# Patient Record
Sex: Female | Born: 1960
Health system: Southern US, Community
[De-identification: ages and names within clinical notes are randomized; demographics above are authoritative.]

## PROBLEM LIST (undated history)

## (undated) DIAGNOSIS — E041 Nontoxic single thyroid nodule: Secondary | ICD-10-CM

## (undated) DIAGNOSIS — M797 Fibromyalgia: Secondary | ICD-10-CM

## (undated) DIAGNOSIS — M3500 Sicca syndrome, unspecified: Secondary | ICD-10-CM

## (undated) DIAGNOSIS — G629 Polyneuropathy, unspecified: Secondary | ICD-10-CM

## (undated) DIAGNOSIS — R000 Tachycardia, unspecified: Secondary | ICD-10-CM

## (undated) HISTORY — PX: MANDIBLE SURGERY: SHX707

## (undated) HISTORY — DX: Tachycardia, unspecified: R00.0

## (undated) HISTORY — DX: Sjogren syndrome, unspecified: M35.00

## (undated) HISTORY — DX: Polyneuropathy, unspecified: G62.9

## (undated) HISTORY — DX: Fibromyalgia: M79.7

## (undated) HISTORY — PX: BACK SURGERY: SHX140

## (undated) HISTORY — DX: Nontoxic single thyroid nodule: E04.1

## (undated) HISTORY — PX: TUBAL LIGATION: SHX77

---

## 1998-09-14 ENCOUNTER — Other Ambulatory Visit: Admission: RE | Admit: 1998-09-14 | Discharge: 1998-09-14 | Payer: Self-pay | Admitting: Obstetrics and Gynecology

## 1999-01-08 ENCOUNTER — Other Ambulatory Visit: Admission: RE | Admit: 1999-01-08 | Discharge: 1999-01-08 | Payer: Self-pay | Admitting: Obstetrics and Gynecology

## 2000-05-15 ENCOUNTER — Other Ambulatory Visit: Admission: RE | Admit: 2000-05-15 | Discharge: 2000-05-15 | Payer: Self-pay | Admitting: Obstetrics and Gynecology

## 2000-08-08 ENCOUNTER — Encounter: Admission: RE | Admit: 2000-08-08 | Discharge: 2000-08-08 | Payer: Self-pay | Admitting: Oral Surgery

## 2000-08-08 ENCOUNTER — Encounter: Payer: Self-pay | Admitting: Oral Surgery

## 2000-08-09 ENCOUNTER — Ambulatory Visit (HOSPITAL_BASED_OUTPATIENT_CLINIC_OR_DEPARTMENT_OTHER): Admission: RE | Admit: 2000-08-09 | Discharge: 2000-08-09 | Payer: Self-pay | Admitting: Oral Surgery

## 2002-08-28 ENCOUNTER — Other Ambulatory Visit: Admission: RE | Admit: 2002-08-28 | Discharge: 2002-08-28 | Payer: Self-pay | Admitting: Obstetrics and Gynecology

## 2002-10-08 ENCOUNTER — Encounter: Payer: Self-pay | Admitting: Obstetrics and Gynecology

## 2002-10-08 ENCOUNTER — Ambulatory Visit (HOSPITAL_COMMUNITY): Admission: RE | Admit: 2002-10-08 | Discharge: 2002-10-08 | Payer: Self-pay | Admitting: Obstetrics and Gynecology

## 2003-10-22 ENCOUNTER — Other Ambulatory Visit: Admission: RE | Admit: 2003-10-22 | Discharge: 2003-10-22 | Payer: Self-pay | Admitting: Obstetrics and Gynecology

## 2004-08-20 ENCOUNTER — Ambulatory Visit: Payer: Self-pay | Admitting: Family Medicine

## 2004-08-25 ENCOUNTER — Ambulatory Visit: Payer: Self-pay | Admitting: Internal Medicine

## 2005-01-21 ENCOUNTER — Ambulatory Visit: Payer: Self-pay | Admitting: Internal Medicine

## 2005-01-25 ENCOUNTER — Ambulatory Visit: Payer: Self-pay | Admitting: Internal Medicine

## 2005-04-25 ENCOUNTER — Ambulatory Visit: Payer: Self-pay | Admitting: Internal Medicine

## 2005-10-04 ENCOUNTER — Other Ambulatory Visit: Admission: RE | Admit: 2005-10-04 | Discharge: 2005-10-04 | Payer: Self-pay | Admitting: Obstetrics and Gynecology

## 2005-10-11 ENCOUNTER — Ambulatory Visit (HOSPITAL_COMMUNITY): Admission: RE | Admit: 2005-10-11 | Discharge: 2005-10-11 | Payer: Self-pay | Admitting: Obstetrics and Gynecology

## 2005-11-02 ENCOUNTER — Ambulatory Visit: Payer: Self-pay | Admitting: Internal Medicine

## 2005-11-08 ENCOUNTER — Ambulatory Visit: Payer: Self-pay | Admitting: Cardiology

## 2006-05-02 ENCOUNTER — Encounter: Admission: RE | Admit: 2006-05-02 | Discharge: 2006-05-02 | Payer: Self-pay | Admitting: Orthopaedic Surgery

## 2008-01-23 ENCOUNTER — Encounter: Admission: RE | Admit: 2008-01-23 | Discharge: 2008-01-23 | Payer: Self-pay | Admitting: Orthopaedic Surgery

## 2008-09-16 ENCOUNTER — Ambulatory Visit: Payer: Self-pay | Admitting: Internal Medicine

## 2008-09-16 DIAGNOSIS — R5383 Other fatigue: Secondary | ICD-10-CM

## 2008-09-16 DIAGNOSIS — R5381 Other malaise: Secondary | ICD-10-CM

## 2008-09-16 DIAGNOSIS — R635 Abnormal weight gain: Secondary | ICD-10-CM | POA: Insufficient documentation

## 2008-09-16 DIAGNOSIS — G4720 Circadian rhythm sleep disorder, unspecified type: Secondary | ICD-10-CM

## 2008-09-16 DIAGNOSIS — R631 Polydipsia: Secondary | ICD-10-CM

## 2008-09-16 DIAGNOSIS — E049 Nontoxic goiter, unspecified: Secondary | ICD-10-CM | POA: Insufficient documentation

## 2008-09-16 DIAGNOSIS — R632 Polyphagia: Secondary | ICD-10-CM | POA: Insufficient documentation

## 2008-09-16 LAB — CONVERTED CEMR LAB
Eosinophils Relative: 1.1 % (ref 0.0–5.0)
Free T4: 0.6 ng/dL (ref 0.6–1.6)
HCT: 37.1 % (ref 36.0–46.0)
Hemoglobin: 13 g/dL (ref 12.0–15.0)
Monocytes Absolute: 0.4 10*3/uL (ref 0.1–1.0)
Monocytes Relative: 7 % (ref 3.0–12.0)
Platelets: 283 10*3/uL (ref 150–400)
WBC: 6.3 10*3/uL (ref 4.5–10.5)

## 2008-09-19 ENCOUNTER — Encounter (INDEPENDENT_AMBULATORY_CARE_PROVIDER_SITE_OTHER): Payer: Self-pay | Admitting: *Deleted

## 2008-09-19 ENCOUNTER — Telehealth (INDEPENDENT_AMBULATORY_CARE_PROVIDER_SITE_OTHER): Payer: Self-pay | Admitting: *Deleted

## 2008-11-18 ENCOUNTER — Ambulatory Visit: Payer: Self-pay | Admitting: Internal Medicine

## 2008-11-18 LAB — CONVERTED CEMR LAB
Free T4: 0.7 ng/dL (ref 0.6–1.6)
T3, Free: 2.9 pg/mL (ref 2.3–4.2)

## 2008-11-19 ENCOUNTER — Encounter (INDEPENDENT_AMBULATORY_CARE_PROVIDER_SITE_OTHER): Payer: Self-pay | Admitting: *Deleted

## 2009-01-22 ENCOUNTER — Ambulatory Visit (HOSPITAL_COMMUNITY): Admission: RE | Admit: 2009-01-22 | Discharge: 2009-01-22 | Payer: Self-pay | Admitting: Obstetrics and Gynecology

## 2009-03-03 ENCOUNTER — Ambulatory Visit: Payer: Self-pay | Admitting: Internal Medicine

## 2009-03-03 DIAGNOSIS — L659 Nonscarring hair loss, unspecified: Secondary | ICD-10-CM | POA: Insufficient documentation

## 2009-03-03 DIAGNOSIS — R002 Palpitations: Secondary | ICD-10-CM | POA: Insufficient documentation

## 2009-03-04 ENCOUNTER — Encounter (INDEPENDENT_AMBULATORY_CARE_PROVIDER_SITE_OTHER): Payer: Self-pay | Admitting: *Deleted

## 2009-03-06 ENCOUNTER — Encounter: Admission: RE | Admit: 2009-03-06 | Discharge: 2009-03-06 | Payer: Self-pay | Admitting: Internal Medicine

## 2009-03-06 LAB — CONVERTED CEMR LAB
Basophils Absolute: 0 10*3/uL (ref 0.0–0.1)
Free T4: 0.7 ng/dL (ref 0.6–1.6)
Hemoglobin: 12.4 g/dL (ref 12.0–15.0)
Lymphocytes Relative: 40.9 % (ref 12.0–46.0)
Monocytes Relative: 1.9 % — ABNORMAL LOW (ref 3.0–12.0)
Neutro Abs: 3.6 10*3/uL (ref 1.4–7.7)
Neutrophils Relative %: 55.3 % (ref 43.0–77.0)
Platelets: 313 10*3/uL (ref 150.0–400.0)
Potassium: 4.1 meq/L (ref 3.5–5.1)
RDW: 13 % (ref 11.5–14.6)
T3, Free: 2.4 pg/mL (ref 2.3–4.2)
TSH: 0.48 microintl units/mL (ref 0.35–5.50)

## 2009-03-10 ENCOUNTER — Ambulatory Visit: Payer: Self-pay | Admitting: Internal Medicine

## 2009-03-10 DIAGNOSIS — E041 Nontoxic single thyroid nodule: Secondary | ICD-10-CM | POA: Insufficient documentation

## 2009-03-12 ENCOUNTER — Ambulatory Visit: Payer: Self-pay | Admitting: Internal Medicine

## 2009-03-13 ENCOUNTER — Encounter (INDEPENDENT_AMBULATORY_CARE_PROVIDER_SITE_OTHER): Payer: Self-pay | Admitting: *Deleted

## 2009-03-13 ENCOUNTER — Telehealth (INDEPENDENT_AMBULATORY_CARE_PROVIDER_SITE_OTHER): Payer: Self-pay | Admitting: *Deleted

## 2009-03-17 ENCOUNTER — Encounter: Payer: Self-pay | Admitting: Internal Medicine

## 2009-03-23 ENCOUNTER — Encounter: Payer: Self-pay | Admitting: Internal Medicine

## 2009-04-02 ENCOUNTER — Encounter (HOSPITAL_COMMUNITY): Admission: RE | Admit: 2009-04-02 | Discharge: 2009-07-01 | Payer: Self-pay | Admitting: Endocrinology

## 2009-04-15 ENCOUNTER — Encounter: Payer: Self-pay | Admitting: Endocrinology

## 2009-04-15 ENCOUNTER — Ambulatory Visit (HOSPITAL_COMMUNITY): Admission: RE | Admit: 2009-04-15 | Discharge: 2009-04-15 | Payer: Self-pay | Admitting: Obstetrics and Gynecology

## 2009-04-15 ENCOUNTER — Encounter (INDEPENDENT_AMBULATORY_CARE_PROVIDER_SITE_OTHER): Payer: Self-pay | Admitting: Interventional Radiology

## 2009-04-28 ENCOUNTER — Encounter: Payer: Self-pay | Admitting: Internal Medicine

## 2009-05-14 ENCOUNTER — Ambulatory Visit: Payer: Self-pay | Admitting: Internal Medicine

## 2009-05-14 DIAGNOSIS — K5909 Other constipation: Secondary | ICD-10-CM | POA: Insufficient documentation

## 2009-05-15 ENCOUNTER — Encounter (INDEPENDENT_AMBULATORY_CARE_PROVIDER_SITE_OTHER): Payer: Self-pay | Admitting: *Deleted

## 2009-06-01 HISTORY — PX: THYROIDECTOMY: SHX17

## 2009-06-03 ENCOUNTER — Observation Stay (HOSPITAL_COMMUNITY): Admission: AD | Admit: 2009-06-03 | Discharge: 2009-06-05 | Payer: Self-pay | Admitting: General Surgery

## 2009-06-03 ENCOUNTER — Encounter (INDEPENDENT_AMBULATORY_CARE_PROVIDER_SITE_OTHER): Payer: Self-pay | Admitting: General Surgery

## 2009-06-17 ENCOUNTER — Encounter: Payer: Self-pay | Admitting: Internal Medicine

## 2009-07-22 ENCOUNTER — Encounter: Payer: Self-pay | Admitting: Internal Medicine

## 2009-08-01 HISTORY — PX: OTHER SURGICAL HISTORY: SHX169

## 2009-09-23 ENCOUNTER — Encounter: Payer: Self-pay | Admitting: Internal Medicine

## 2009-10-26 ENCOUNTER — Encounter (INDEPENDENT_AMBULATORY_CARE_PROVIDER_SITE_OTHER): Payer: Self-pay | Admitting: *Deleted

## 2009-10-26 ENCOUNTER — Ambulatory Visit: Payer: Self-pay | Admitting: Internal Medicine

## 2009-10-26 DIAGNOSIS — J1189 Influenza due to unidentified influenza virus with other manifestations: Secondary | ICD-10-CM

## 2009-10-26 LAB — CONVERTED CEMR LAB: Inflenza A Ag: POSITIVE

## 2009-11-02 ENCOUNTER — Encounter: Payer: Self-pay | Admitting: Internal Medicine

## 2010-05-21 ENCOUNTER — Ambulatory Visit: Payer: Self-pay | Admitting: Internal Medicine

## 2010-05-21 DIAGNOSIS — R519 Headache, unspecified: Secondary | ICD-10-CM | POA: Insufficient documentation

## 2010-05-21 DIAGNOSIS — R6889 Other general symptoms and signs: Secondary | ICD-10-CM

## 2010-05-21 DIAGNOSIS — R51 Headache: Secondary | ICD-10-CM | POA: Insufficient documentation

## 2010-05-24 LAB — CONVERTED CEMR LAB
FSH: 104.7 milliintl units/mL
TSH: 13.63 microintl units/mL — ABNORMAL HIGH (ref 0.35–5.50)

## 2010-06-15 ENCOUNTER — Encounter: Payer: Self-pay | Admitting: Internal Medicine

## 2010-06-28 ENCOUNTER — Telehealth (INDEPENDENT_AMBULATORY_CARE_PROVIDER_SITE_OTHER): Payer: Self-pay | Admitting: *Deleted

## 2010-08-23 ENCOUNTER — Encounter: Payer: Self-pay | Admitting: Endocrinology

## 2010-08-31 NOTE — Assessment & Plan Note (Signed)
Summary: fever,chills, cough, body aches//lch   Vital Signs:  Patient profile:   50 year old female Weight:      206.4 pounds Temp:     103.0 degrees F oral Pulse rate:   94 / minute Resp:     17 per minute BP sitting:   114 / 72  (left arm) Cuff size:   large  Vitals Entered By: Georgette Dover (October 26, 2009 12:12 PM) CC: Fever,chills,body aches, & cough (slightly productive) since Saturday night Comments REVIEWED MED LIST, PATIENT AGREED DOSE AND INSTRUCTION CORRECT    CC:  Fever, chills, body aches, and & cough (slightly productive) since Saturday night.  History of Present Illness: Onset 10/24/2009 as NP cough with temp to 101 as of 03/27. She had Flu shot in 06/2009.Rx: NSAIDS  Allergies (verified): No Known Drug Allergies  Review of Systems General:  Complains of chills; denies sweats. ENT:  Complains of sinus pressure; denies sore throat; Frontal headache; minimal clear  discharge. Minor earache.. Resp:  Denies chest pain with inspiration, coughing up blood, shortness of breath, and wheezing; Scant sputum . MS:  Complains of muscle aches and thoracic pain.  Physical Exam  General:  well-nourished,in no acute distress; alert,appropriate and cooperative throughout examination, non toxic Eyes:  No corneal or conjunctival inflammation noted. EOMI. Perrla.  Vision grossly normal. Ears:  External ear exam shows no significant lesions or deformities.  Otoscopic examination reveals clear canals, tympanic membranes are intact bilaterally without bulging, retraction, inflammation or discharge. Hearing is grossly normal bilaterally. Nose:  External nasal examination shows no deformity or inflammation. Nasal mucosa are pink and moist without lesions or exudates. Mouth:  Oral mucosa and oropharynx without lesions or exudates.  Teeth in good repair. no pharyngeal erythema.   Lungs:  Normal respiratory effort, chest expands symmetrically. Lungs are clear to auscultation, no crackles or  wheezes. dry cough. O2 sats 98% Heart:  regular rhythm, no murmur, and tachycardia(P 104).  S4 Skin:  Intact without suspicious lesions or rashesWarm & dry Cervical Nodes:  No lymphadenopathy noted; no tenderness Axillary Nodes:  No palpable lymphadenopathy   Impression & Recommendations:  Problem # 1:  INFLUENZA (ICD-487.8)  Complete Medication List: 1)  Topamax 100 Mg Tabs (Topiramate) .... 3 once daily 2)  Omeprazole 20 Mg Cpdr (Omeprazole) .Marland Kitchen.. 1 pre b'fast & eve meal 3)  Levothyroxine Sodium 150 Mcg Tabs (Levothyroxine sodium) .Marland Kitchen.. 1 by mouth once daily 4)  Lyrica 75 Mg Caps (Pregabalin) .Marland Kitchen.. 1 by mouth two times a day 5)  Meloxicam 15 Mg Tabs (Meloxicam) .Marland Kitchen.. 1 by mouth once daily 6)  Tamiflu 75 Mg Caps (Oseltamivir phosphate) .Marland Kitchen.. 1 two times a day  Other Orders: Flu A+B [id number])  Patient Instructions: 1)  Recommended remaining out of work for  03/28- 11/02/2009 due to Influenza A infection. 2)  Drink as much fluid as you can tolerate for the next few days. 3)  Take 650-1000mg  of Tylenol every 4-6 hours as needed for relief of pain or comfort of fever AVOID taking more than 4000mg   in a 24 hour period (can cause liver damage in higher doses) OR take 400-600mg  of Ibuprofen (Advil, Motrin) with food every 4-6 hours as needed for relief of pain or comfort of fever. Report Warning Signs as as discussed. Prescriptions: TOPAMAX 100 MG TABS (TOPIRAMATE) 3 once daily  #270 x 1   Entered and Authorized by:   Unice Cobble MD   Signed by:   Unice Cobble MD on 10/26/2009  Method used:   [facility]ed [facility] ...       [phone][facility]       [place], [place]  [address]       Ph: [phone] or [phone]       Fax: [phone]   RxID:   [id number] TAMIFLU 75 MG CAPS (OSELTAMIVIR PHOSPHATE) 1 two times a day  #10 x 0   Entered and Authorized by:   Unice Cobble MD   Signed by:   Unice Cobble MD on 10/26/2009   Method used:   Faxed to ...        [facility] [phone][facility] (retail)       [address] [place], [place]  [address]       Ph: [phone] or [phone]       Fax: [phone]   RxID:   [id number]   Laboratory Results    Other Tests  Influenza A: positive Influenza B: negative

## 2010-08-31 NOTE — Letter (Signed)
Summary: [facility]   Imported By: Edmonia James 11/09/2009 13:43:18  _____________________________________________________________________  External Attachment:    Type:   Image     Comment:   External Document

## 2010-08-31 NOTE — Letter (Signed)
Summary: [facility]   Imported By: Edmonia James 08/24/2009 [id number]  _____________________________________________________________________  External Attachment:    Type:   Image     Comment:   External Document

## 2010-08-31 NOTE — Letter (Signed)
Summary: [facility]  [facility]   Imported By: Edmonia James 10/01/2009 08:39:04  _____________________________________________________________________  External Attachment:    Type:   Image     Comment:   External Document

## 2010-08-31 NOTE — Assessment & Plan Note (Signed)
Summary: FOR HEADACHE//PH   Vital Signs:  Patient profile:   50 year old female Height:      63.75 inches Weight:      183.6 pounds BMI:     31.88 Temp:     98.3 degrees F oral Pulse rate:   60 / minute Resp:     15 per minute BP sitting:   114 / 70  (left arm) Cuff size:   large  Vitals Entered By: Georgette Dover CMA (May 21, 2010 9:27 AM) CC: HA's, Headaches   CC:  HA's and Headaches.  History of Present Illness:      Onset of severe headache since 03/20/2010 suboptimally responsive to Zomig, Oxycodone , massage , injections & another med from [facility] . She has been taking NSAIDS 600 mg two times a day on her own since Oct 1st.  The patient reports nausea, vomiting, sweats, tearing of eyes, sinus pressure, and photophobia, but denies phonophobia.  The headache is described as constant  diffusely and intermittent around OD.   She has chronic  neck pain/stiffness in context of cervical disc disease.  The patient denies the following high-risk features: fever, vision loss or change, focal weakness, and rash.  Caffeine intake : 1 cup coffee/ day.  Current Medications (verified): 1)  Topamax 100 Mg Tabs (Topiramate) .... 3 Once Daily 2)  Omeprazole 20 Mg Cpdr (Omeprazole) .Marland Kitchen.. 1 Pre B'fast & Eve Meal 3)  Levothyroxine Sodium 150 Mcg Tabs (Levothyroxine Sodium) .Marland Kitchen.. 1 By Mouth Once Daily  Allergies (verified): No Known Drug Allergies  Past History:  Past Medical History: Migraines Thyroid nodule  Past Surgical History: Jaw surgery; G2 P2; Tubal ligation; TVT , Dr Raphael Gibney, [place] 12/2008; surgical repair 04/2009 for poor healing; Thyroidectomy 06/2009. Intrauterine ablation 2011, Dr Burke Keels, [place]  Review of Systems General:  Complains of chills; denies fatigue and weight loss. Eyes:  Denies blurring, double vision, and vision loss-both eyes. ENT:  Denies decreased hearing and ringing in ears. GI:  Denies constipation and diarrhea. Neuro:  Denies brief paralysis,  numbness, and tingling. Endo:  Cold > heat intolerance X 8 months. TSH checked by Dr Chalmers Cater 10/2009. S/P intrauterine ablation recently.  Physical Exam  General:  well-nourished,in no acute distress; alert,appropriate and cooperative throughout examination Eyes:  No corneal or conjunctival inflammation noted. EOMI. Perrla. Field of  Vision grossly normal. Ears:  External ear exam shows no significant lesions or deformities.  Otoscopic examination reveals clear canals, tympanic membranes are intact bilaterally without bulging, retraction, inflammation or discharge. Hearing is grossly normal bilaterally. Some wax on L Nose:  External nasal examination shows no deformity or inflammation. Nasal mucosa are pink and moist without lesions or exudates. Mouth:  Oral mucosa and oropharynx without lesions or exudates.  Teeth in good repair. Neck:  No deformities, masses, or tenderness noted. Physiologic thyroid  area post op changes Heart:  Normal rate and regular rhythm. S1 and S2 normal without gallop, murmur, click, rub or other extra sounds. Pulses:  R and L carotid pulses are full and equal bilaterally w/o bruits Extremities:  No clubbing, cyanosis, edema. Neurologic:  alert & oriented X3, cranial nerves II-XII intact, strength normal in all extremities, sensation intact to light touch, gait normal, DTRs symmetrical and normal, finger-to-nose normal, heel-to-shin normal, and Romberg negative.   Skin:  Intact without suspicious lesions or rashes Cervical Nodes:  No lymphadenopathy noted Axillary Nodes:  No palpable lymphadenopathy Psych:  memory intact for recent and remote, normally interactive, good eye  contact, and not anxious appearing.     Impression & Recommendations:  Problem # 1:  HEADACHE (ICD-784.0)  Probable conversion of migraine into Chronic Daily Headache due to NSAIDS The following medications were removed from the medication list:    Meloxicam 15 Mg Tabs (Meloxicam) .Marland Kitchen... 1 by  mouth once daily  Orders: Headache Clinic Referral (Headache)  Problem # 2:  OTHER GENERAL SYMPTOMS (ICD-780.99)  Cold > heat intolerance  Orders: Venipuncture [id number]) TLB-TSH (Thyroid Stimulating Hormone) (99991111) TLB-FSH (Follicle Stimulating Hormone) (83001-FSH)  Complete Medication List: 1)  Topamax 100 Mg Tabs (Topiramate) .... 3 once daily 2)  Omeprazole 20 Mg Cpdr (Omeprazole) .Marland Kitchen.. 1 pre b'fast & eve meal 3)  Levothyroxine Sodium 150 Mcg Tabs (Levothyroxine sodium) .Marland Kitchen.. 1 by mouth once daily  Patient Instructions: 1)  Wean NSAIDS as discussed( 600 mg two times a day to 200mg  2 two times a day X 5 days then 1 two times a day X 5 days). Use Zomig as needed    Orders Added: 1)  Est. Patient Level IV [id number] 2)  Headache Clinic Referral [Headache] 3)  Venipuncture [id number] 4)  TLB-TSH (Thyroid Stimulating Hormone) [84443-TSH] 5)  TLB-FSH (Follicle Stimulating Hormone) [83001-FSH]  Appended Document: FOR HEADACHE//PH

## 2010-08-31 NOTE — Letter (Signed)
Summary: Out of Work  [facility]   [place], [place] [address]   Phone: [phone]  Fax: [phone]    October 26, 2009   Employee:  Terri Roberts    To Whom It May Concern:   For Medical reasons, please excuse the above named employee from work for the following dates:  Start:   October 26, 2009  End:   October 30, 2009 (Return on Friday)  If you need additional information, please feel free to contact our office.         Sincerely,    Chrae Emmaline Kluver

## 2010-08-31 NOTE — Progress Notes (Signed)
Summary: Refill Request  Phone Note Refill Request Call back at [phone] Message from:  Pharmacy on June 28, 2010 8:33 AM  Refills Requested: Medication #1:  TOPAMAX 100 MG TABS 3 once daily   Dosage confirmed as above?Dosage Confirmed   Brand Name Necessary? No   Supply Requested: 3 months   [facility] [address] [place]  Next Appointment Scheduled: none Initial call taken by: Elna Breslow,  June 28, 2010 8:34 AM    Prescriptions: TOPAMAX 100 MG TABS (TOPIRAMATE) 3 once daily  #270 x 1   Entered by:   [place] by:   Unice Cobble MD   Signed by:   Georgette Dover CMA on 06/28/2010   Method used:   Electronically to        [facility] [phone]* (retail)       [place], [place]  [address]       Ph: [phone] or [phone]       Fax: [phone]   RxID:   [id number]

## 2010-09-02 NOTE — Letter (Signed)
Summary: [facility]   Imported By: Edmonia James 07/12/2010 10:33:17  _____________________________________________________________________  External Attachment:    Type:   Image     Comment:   External Document

## 2010-09-13 ENCOUNTER — Telehealth: Payer: Self-pay | Admitting: Internal Medicine

## 2010-09-22 NOTE — Progress Notes (Signed)
Summary: Call A Nurse Report/recommendations  Phone Note Call from Patient Call back at Home Phone [phone]   Details for Reason: Call-A-Nurse Triage Call Report Triage Record Num: [id number] Operator: [place] Patient Name: Terri Roberts Call Date & Time: 09/11/2010 12:53:42PM Patient Phone: [phone] PCP: Unice Cobble Patient Gender: Female PCP Fax : [phone] Patient DOB: 11-Sep-1960 Practice Name: [facility] Reason for Call: Caller states that she developed nausea and vomiting last night about midnight, last vomited about 5am, now has watery diarrhea q 15 mins, states that her dad is an assisted living facility and has the same virus (states there is now a quarantine there because of the virus). Not sure if she has a fever, states she is "hot and cold", she is taking sips of water, Called in Phenergan 25mg  supp, use one per rectum q 4 hrs prn, #6, no refill, Dr. Ernestine Conrad on call, to [place], [place], [phone]. Gave care advice per protocol and callback parameters discussed. Protocol(s) Used: Nausea or Vomiting Recommended Outcome per Protocol: Provide Home/Self Care Reason for Outcome: New onset of 3-4 episodes vomiting or diarrhea following mild abdominal cramping Care Advice: Antidiarrheal medications are usually unnecessary. Use only after consulting your provider. Application of A&D ointment or witch hazel medicated pads may help relieve anal irritation.  ~  ~ Call provider if symptoms do not improve after 24 hours of home care. Call provider immediately if develop severe pain, black, tarry stools, bloody stools, blood-streaked or coffee ground-looking vomitus, or abdomen swollen.  ~  ~ SYMPTOM / CONDITION MANAGEMENT  ~ CAUTIONS Go to the ED if you have developed signs and symptoms of dehydration such as very dry mouth and tongue; increased pulse rate at rest; no urine output for 8 hours or more; increasing weakness or  drowsiness, or lightheadedness when trying to sit upright or standing.  ~ Vomiting and Diarrhea Care: - Do not eat solid foods until vomiting subsides. - Begin taking fluids by sucking on ice chip Details of Complaint: or popsicles or taking sips of cool, clear fluids (soda, fruit juices that are low acid, sports drinks or nonprescription oral rehydration solution). - Gradually drink larger amounts of these fluids so that you are drinking six to eight 8 oz. (1.2 to 1.6 liters) of fluids a day. - Keep activity to a minimum. - Once vomiting and diarrhea subside, eat smaller, more frequent meals of easily digested foods such as crackers, toast, bananas, rice, cooked cereal, applesauce, broth, baked or mashed potatoes, chicken or [place] without skin. Eat slowly. - Take fluids 30 minutes before or 60 minutes after meals. - Avoid high fat, highly seasoned, high fiber or high sugar content foods. - Avoid extremely hot or cold foods. - Do not take pain medication (such as aspirin, NSAIDs) while nauseated or vomiting. - Do not drink caffeinated or alcoholic beverages. Summary of Call: I called to check on the patient and she notes that she ended up going to the ER and was dehydrated and also had low Potassium. She notes that she has lost 9 pounds witht his virus. She states that she has been able keep a little bit down, she has nausea and her diarrhea was every 15 minutes, but has now went to every hour. Any recommendations? Initial call taken by: Ernestene Mention CMA,  September 13, 2010 11:43 AM  Follow-up for Phone Call        Clear liquids; Lomotil 2.5 mg  (#100 as needed for watery diarrhea  if not Rxed by ER. OV if no better. Follow-up by: Unice Cobble MD,  September 13, 2010 1:11 PM  Additional Follow-up for Phone Call Additional follow up Details #1::        Left message on voicemail to call back to office. Ernestene Mention CMA  September 13, 2010 2:21 PM   Patient notified. Ernestene Mention CMA   September 13, 2010 2:43 PM     New/Updated Medications: LOMOTIL 2.5-0.025 MG TABS (DIPHENOXYLATE-ATROPINE) Take as directed for watery stools Prescriptions: LOMOTIL 2.5-0.025 MG TABS (DIPHENOXYLATE-ATROPINE) Take as directed for watery stools  #100 x 0   Entered by:   Ernestene Mention CMA   Authorized by:   Unice Cobble MD   Signed by:   Ernestene Mention CMA on 09/13/2010   Method used:   Printed then faxed [facility] ...       [facility] [phone][facility]ail[facility]       [place], [place]  [address]       Ph: [phone] or [phone]       Fax: [phone]   RxID:   [id number]

## 2010-10-08 ENCOUNTER — Telehealth (INDEPENDENT_AMBULATORY_CARE_PROVIDER_SITE_OTHER): Payer: Self-pay | Admitting: *Deleted

## 2010-10-12 NOTE — Progress Notes (Signed)
Summary: refill  Phone Note Refill Request Message from:  Fax from Pharmacy on October 08, 2010 3:53 PM  Refills Requested: Medication #1:  TOPIRAMATE 200 MG TAB TAKE 2 TABLETS EVERY DAY [facility] [phone]  Initial call taken by: Arbie [place],  October 08, 2010 3:55 PM    Prescriptions: TOPAMAX 100 MG TABS (TOPIRAMATE) 3 once daily  #270 x 1   Entered by:   [place] by:   Unice Cobble MD   Signed by:   Georgette Dover CMA on 10/08/2010   Method used:   Electronically to        [facility] [phone]* (retail)       [place], [place]  [address]       Ph: [phone] or [phone]       Fax: [phone]   RxID:   [id number]

## 2010-10-14 ENCOUNTER — Other Ambulatory Visit (HOSPITAL_COMMUNITY): Payer: Self-pay | Admitting: Family Medicine

## 2010-10-14 DIAGNOSIS — Z1231 Encounter for screening mammogram for malignant neoplasm of breast: Secondary | ICD-10-CM

## 2010-10-21 ENCOUNTER — Ambulatory Visit (HOSPITAL_COMMUNITY)
Admission: RE | Admit: 2010-10-21 | Discharge: 2010-10-21 | Disposition: A | Discharge status: Home / Self Care | Payer: BC Managed Care – PPO | Source: Ambulatory Visit | Attending: Family Medicine | Admitting: Family Medicine

## 2010-10-21 DIAGNOSIS — Z1231 Encounter for screening mammogram for malignant neoplasm of breast: Secondary | ICD-10-CM | POA: Insufficient documentation

## 2010-11-03 LAB — COMPREHENSIVE METABOLIC PANEL
ALT: 13 U/L (ref 0–35)
Albumin: 3.9 g/dL (ref 3.5–5.2)
Alkaline Phosphatase: 49 U/L (ref 39–117)
BUN: 14 mg/dL (ref 6–23)
Chloride: 113 mEq/L — ABNORMAL HIGH (ref 96–112)
Potassium: 3.6 mEq/L (ref 3.5–5.1)
Sodium: 139 mEq/L (ref 135–145)
Total Bilirubin: 0.5 mg/dL (ref 0.3–1.2)

## 2010-11-03 LAB — CALCIUM: Calcium: 7.9 mg/dL — ABNORMAL LOW (ref 8.4–10.5)

## 2010-11-05 LAB — BASIC METABOLIC PANEL
CO2: 23 mEq/L (ref 19–32)
Chloride: 109 mEq/L (ref 96–112)
GFR calc non Af Amer: >60 mL/min (ref >60)
Glucose, Bld: 101 mg/dL — ABNORMAL HIGH (ref 70–99)
Potassium: 3.5 mEq/L (ref 3.5–5.1)
Sodium: 137 mEq/L (ref 135–145)

## 2010-11-05 LAB — URINALYSIS, ROUTINE W REFLEX MICROSCOPIC
Bilirubin Urine: NEGATIVE
Specific Gravity, Urine: 1.01 (ref 1.005–1.030)
Urobilinogen, UA: 0.2 mg/dL (ref 0.0–1.0)

## 2010-11-05 LAB — CBC
HCT: 39 % (ref 36.0–46.0)
Hemoglobin: 13.3 g/dL (ref 12.0–15.0)
RDW: 12.6 % (ref 11.5–15.5)

## 2010-11-05 LAB — URINE MICROSCOPIC-ADD ON

## 2010-11-08 LAB — URINALYSIS, ROUTINE W REFLEX MICROSCOPIC
Bilirubin Urine: NEGATIVE
Nitrite: NEGATIVE
Specific Gravity, Urine: 1.01 (ref 1.005–1.030)
pH: 6 (ref 5.0–8.0)

## 2010-11-08 LAB — CBC
HCT: 36.7 % (ref 36.0–46.0)
Hemoglobin: 12.9 g/dL (ref 12.0–15.0)
RBC: 3.84 MIL/uL — ABNORMAL LOW (ref 3.87–5.11)
WBC: 7.2 10*3/uL (ref 4.0–10.5)

## 2010-11-08 LAB — BASIC METABOLIC PANEL
GFR calc Af Amer: >60 mL/min (ref >60)
GFR calc non Af Amer: >60 mL/min (ref >60)
Potassium: 3.7 mEq/L (ref 3.5–5.1)
Sodium: 139 mEq/L (ref 135–145)

## 2010-12-14 NOTE — Op Note (Signed)
NAME:  Terri Roberts, BAUMGARDNER NO.:  [id number]   MEDICAL RECORD NO.:  [id number]          PATIENT TYPE:  AMB   LOCATION:  [place]                           FACILITY:  [facility]   PHYSICIAN:  Eli Hose, M.D.DATE OF BIRTH:  Dec 25, 1960   DATE OF PROCEDURE:  01/22/2009  DATE OF DISCHARGE:  01/22/2009                               OPERATIVE REPORT   PREOPERATIVE DIAGNOSES:  1. Stress urinary incontinence.  2. 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   POSTOPERATIVE DIAGNOSES:  1. Stress urinary incontinence.  2. 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   PROCEDURE:  1. Tension-free vaginal tape procedure using the tension-free vaginal      tape secur Hammoch approach.  2. Cystoscopy.   SURGEON:  Eli Hose, MD   FIRST ASSISTANT:  None.   ANESTHETIC:  General.   DISPOSITION:  Ms. Mastin is a 50 year old female, para 1-0-3-1, who  presents with the above-mentioned diagnosis.  She understands the  indications for her surgical procedure as well as the alternative  treatment options.  She accepts the risks of, but not limited to,  anesthetic complications, bleeding, infections, possible damage to the  surrounding organs, and possible mesh erosion.   FINDINGS:  The patient was noted to have a uterus that was normal size,  shape, and consistency.  There were no adnexal masses.  The patient was  noted to have pelvic laxation with a cystocele with loss of the  urethrovesical angle.  On cystoscopy, the anterior of the bladder was  noted to be normal.  There was no evidence of pathology.  After our  procedure, there was no evidence of mesh in the bladder.  Dye easily  passed through both ureteral orifices.   PROCEDURE:  The patient was taken to the operating room, where general  anesthetic was given.  The patient's lower abdomen, perineum, and vagina  were prepped with multiple layers of Betadine.  A Foley catheter was  placed in the  bladder.  The patient was sterilely draped.  Examination  under anesthesia was performed.  The hips were noted to be at a 90-  degree angle.  The suburethral area was injected with Marcaine with  epinephrine.  A 1-1/2 cm long incision was made 1 cm from the urethral  meatus.  The vaginal tissue was dissected from the periurethral tissue  for length about 1-2 cm to the right and to the left of the midline.  We  then used the TVT secur system to place the mesh.  The device was  progressed toward the ischial pubic ramus until we came to the inferior  edge.  We then advanced the right applicator into the obturator internus  muscle.  An identical procedure was carried out on the left side.  The  tension of the mesh was adjusted until it was felt to be appropriate.  Care was taken not to have the mesh too tight.  We removed the Foley  catheter and the cystoscope was inserted.  The patient was given indigo  carmine dye.  Dye easily passed through both ureteral  orifices.  There  was no evidence of mesh in the bladder and no evidence of pathology in  the bladder.  The cystoscope was removed.  The Foley catheter was  reinserted to drain the bladder.  The applicators were then released  using the spring and the mesh was again noted to be in the proper  position.  The incision in the vaginal mucosa was closed using figure-of-  eight sutures of 2-0 Vicryl.  Hemostasis was achieved in the process.  Sponge, needle, and instrument counts were correct.  The estimated blood  loss for the procedure was 50 mL.  The Foley catheter was removed prior  to transporting the patient to the recovery room.  The patient tolerated  her procedure well.   FOLLOWUP INSTRUCTIONS:  The patient will return to see Dr. Raphael Gibney in 2  weeks for followup examination.  She will call for any difficulty  voiding.  She was given a prescription for:  1. Motrin 800 mg every 8 hours as needed for mild-to-moderate pain.  2. Zofran 8 mg  every 8 hours as needed for nausea.  3. Darvocet 1 or 2 tablets every 4 hours as needed for severe pain.  4. Premarin vaginal cream 1-quarter inch of cream using the finger in      the vagina each night for 2 weeks.  5. Septra DS 1 tablet twice each day for 7 days.      Eli Hose, M.D.  Electronically Signed     AVS/MEDQ  D:  01/22/2009  T:  01/23/2009  Job:  [id number]

## 2010-12-14 NOTE — H&P (Signed)
NAME:  Terri Roberts, Terri Roberts NO.:  [id number]   MEDICAL RECORD NO.:  [id number]          PATIENT TYPE:  AMB   LOCATION:  [place]                           FACILITY:  [facility]   PHYSICIAN:  Eli Hose, M.D.DATE OF BIRTH:  July 03, 1961   DATE OF ADMISSION:  DATE OF DISCHARGE:                              HISTORY & PHYSICAL   HISTORY OF PRESENT ILLNESS:  [name] is a 50 year old female, para  1-0-3-1, who presents for a tension-free vaginal tape placement.  The  patient has been followed at the [facility].  The patient  complains of stress urinary incontinence.  She has known pelvic  relaxation with loss of the urethrovesical angle.  Kegel exercises have  not relieved her discomfort.   Her most recent Pap smear was from 2010 and it was within normal limits.  She has a known history of fibroids.  She has had a negative endometrial  biopsy in the past.  The patient had a NovaSure ablation of the  endometrium in 2008 and she is not having cycle any longer.   OBSTETRICAL HISTORY:  The patient has had 1 term vaginal delivery.  She  has had 3 elective pregnancy terminations.   PAST MEDICAL HISTORY:  The patient has a history of sickle cell trait.  She has a history of migraine headaches and she takes Topamax.   SOCIAL HISTORY:  The patient drinks alcohol socially.  She denies  cigarette use and other recreational drug uses.   DRUG ALLERGIES:  No known drug allergies.   FAMILY HISTORY:  The patient's mother has insulin-dependent diabetes.  She has a brother with sickle cell anemia.   REVIEW OF SYSTEMS:  Noncontributory.   PHYSICAL EXAMINATION:  VITAL SIGNS:  Weight is 204 pounds.  Height is 5  feet 3 inches.  HEENT:  Within normal limits.  CHEST:  Clear.  HEART:  Regular rate and rhythm.  BREASTS:  Without masses.  ABDOMEN:  Nontender.  EXTREMITIES:  Grossly normal.  NEUROLOGIC:  Grossly  normal.  PELVIC:  External genitalia is normal.  Vagina is normal except for a  cystocele with loss of the urethrovesical angle.  Cervix is nontender.  Uterus is upper limits, normal size, and irregular.  Adnexa no masses,  and rectovaginal exam confirms.   ASSESSMENT:  1. Stress urinary incontinence.  2. Pelvic relaxation with a cystocele and loss of urethrovesical      angle.   PLAN:  The patient will undergo a tension-free vaginal tape.  She  understands the indications for her surgical procedure and she accepts  the risks of, but not limited to, anesthetic complications, bleeding,  infections, possible damage to the surrounding organs, and possible mesh  erosion.  We will perform a cystoscopy at the end of our procedure.      Eli Hose, M.D.  Electronically Signed     AVS/MEDQ  D:  01/17/2009  T:  01/18/2009  Job:  [id number]

## 2010-12-17 NOTE — Op Note (Signed)
[place]  Patient:    Terri Roberts, Terri Roberts                        MRN: [id number] Proc. Date: 08/09/00 Adm. Date:  [id number] Disc. 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  SURGERY PERFORMED:  Bilateral sagittal split ramus osteotomies with mandibular advancement of approximately 10 mm and rotation to the left approximately 5 mm, insertion of custom acrylic interocclusal appliance, rigid internal fixation utilizing the Walter-Lorenz 2.0 mm titanium system.  SURGEON:  Loralee Pacas. Terence Lux, D.D.S.  FIRST ASSISTANT:  Ruthann Cancer.  ANESTHESIA:  General via nasal endotracheal intubation.  ESTIMATED BLOOD LOSS:  200 cc.  FLUID REPLACEMENT:  Approximately 2000 cc Crystalloid solutions.  COMPLICATIONS:  None apparent.  INDICATIONS:  Ms. Perrin is a 50 year old female, who was referred to my office for evaluation and treatment of her facial/skeletal malformation.  The patient presents with chronic history of subluxation of the TM joints bilaterally with associated pain and popping that have been unresponsive to all conservative measures.  The TM joint dysfunction was due to the mandibular malformation and the patient forcing her jaws to eat in a normal type fashion. The patient had undergone all conservative measures in attempts to correct this and the only means of treatment likely to correct this was surgical intervention.  DETAILS OF PROCEDURE:  On August 09, 2000, Ms. Hodel was taken to [facility], where she was placed on the operating room table in a supine position.  Following successful nasal endotracheal intubation and general anesthesia, the patients face, neck and oral cavity were prepped and draped in  the usual sterile operating room fashion.  The hypopharynx was suctioned free of fluids and secretions, and a moistened 2 inch vaginal pack was placed as a throat pack.  Attention was then directed intraorally, where approximately 10 cc of 0.5% Xylocaine containing 1:200,000 epinephrine were infiltrated in the right inferior alveolar neurovascular region and the soft tissues overlying the ascending ramus and the right posterior mandibular buccal vestibule.  A #15 Bard-Parker blade was then used to create a 2.5 curvilinear incision beginning in the soft tissues of the ramus and brought through the mucoperiosteal tissues of the right lateral oblique ridge to a distance of approximately 1.5 cm from the second molar tooth.  A #9 Molt periosteal elevator was then used to reflect a full-thickness mucoperiosteal flap laterally and inferiorly to the inferior border of the mandible, which was identified using a curved Soil scientist.  The mucoperiosteal tissues were then reflected off of the oblique ridge and up the ascending ramus to a distance of approximately 2.0 cm from the tip of the coronoid process.  At this position, a Dingman bone clamp was used to retract the tissues superiorly.  A double-ended Molt curet was then used to identify the coronoid notch and from the coronoid notch inferiorly to the lingua, the periosteum was reflected medially to the lingula, which was identified and a Selden retractor placed to protect the soft tissues and neurovascular bundle.  A Stryker rotary osteotome with a 107 fissure bur followed by a Lindeman bur was then used to create a medial, horizontal, cortical osteotomy  from just above the lingula to the midline of the ramus.  A channel retractor was then placed to protect the lateral soft tissues and a Stryker rotary osteotome with the BorgWarner bur was then used to create a lateral, cortical, vertical osteotomy from the inferior border of the mandible to a  distance of approximately 1.5 cm lateral to the second molar tooth.  The osteotomies were then joined across the superior oblique ridge portion of the mandible using the 107 fissure bur.  In a similar fashion, the left mandible was cut in preparation for a sagittal split ramus osteotomy.  The left mandible was then split in a sagittal fashion using the mandibular osteotomes and gentle tapping and twisting pressure using a fiberglass tipped mallet.  The mandible split appropriately with the neurovascular bundle remaining intact.  The pterygomasseteric apparatus was then detached from the distal portion of the mandible using a pterygomasseteric sling stripper.  In a similar fashion, the right mandible was split in a sagittal fashion.  The neurovascular bundle remaining intact, then pterygomasseteric apparatus detached.  A previously constructed custom acrylic interocclusal appliance was then affixed to the maxillary dentition and secured by passing four 28-gauge circumorthodontic wire ligature loops with the ends of the wires cut, twisted, and rosetted atraumatically against the buccal surface of the gingival soft tissues.  The throat pack was removed and the hypopharynx suctioned free of fluids and secretions.  The mandible was now advanced and rotated to the left with the mandibular dentition fitting appropriately into the undersurface of the appliance.  The distal portion of the mandible was secured in this position by passing six 26-gauge stainless steel wire ligature loops in a circumorthodontic fashion. The ends of the wires were then twisted and cut and rosetted atraumatically against the buccal surface of the dentition.  The condylar segments were then positioned into the glenoid fossa using a Kocher bone clamp and a wire directing instrument.  Digital pressure was used to ascertain seating of the condyles.  The condyles in this position, the condylar segments were secured  by passing bilaterally 24-gauge circumorthodontic wire ligature loops.  The wires were twisted against the superior oblique ridge portion of the mandible.   Rigid internal fixation was then applied in the following manner.  The 54M mini driver with 1.5 mm drill was used to create three bicortical holes across the superior oblique ridge portion of the mandible bilaterally.  The holes were then tapped using the Apolonio Schneiders 2.0 mm tap, the holes measured, then appropriate 2.0 mm titanium screws were placed with three being placed on either side of the mandible.  The aforementioned 24-gauge stainless steel wire loops were then cut and removed from the surgical site.  The surgical areas were then thoroughly and copiously irrigated with sterile saline irrigating solutions and suctioned.  The mucoperiosteal margins were approximated and sutured in an interrupted fashion using 4-0 Vicryl suture material on a RB-1 needle.  The intermaxillary loops were then cut and removed from the oral cavity.  The mandible was manipulated in an open and closed fashion and found to fit appropriately into the acrylic appliance without deviation or shifting.  Class one box elastics were placed to guide the mandible in its new position.  The patient was then allowed to awaken from the anesthesia and taken to the recovery room, where she tolerated the procedure well and without apparent complication. DD:  08/11/00 TD:  08/11/00 Job: [id number]

## 2010-12-22 ENCOUNTER — Ambulatory Visit (INDEPENDENT_AMBULATORY_CARE_PROVIDER_SITE_OTHER): Payer: BC Managed Care – PPO | Admitting: Internal Medicine

## 2010-12-22 ENCOUNTER — Encounter: Payer: Self-pay | Admitting: Internal Medicine

## 2010-12-22 VITALS — BP 114/70 | HR 76 | Temp 98.6°F | Wt 182.0 lb

## 2010-12-22 DIAGNOSIS — M25562 Pain in left knee: Secondary | ICD-10-CM

## 2010-12-22 DIAGNOSIS — M25469 Effusion, unspecified knee: Secondary | ICD-10-CM

## 2010-12-22 DIAGNOSIS — M25569 Pain in unspecified knee: Secondary | ICD-10-CM

## 2010-12-22 DIAGNOSIS — M25462 Effusion, left knee: Secondary | ICD-10-CM

## 2010-12-22 MED ORDER — CELECOXIB 200 MG PO CAPS: 200.0000 mg | ORAL_CAPSULE | Freq: Every day | ORAL | Status: AC

## 2010-12-22 NOTE — Patient Instructions (Signed)
Celebrex 200 mg one twice a day as samples.

## 2010-12-22 NOTE — Progress Notes (Signed)
  Subjective:    Patient ID: Terri Roberts, female    DOB: [birth year], 50 y.o.   MRN: [id number]  HPI Extremity pain Onset:> 1 month ago Trigger/injury:she felt & heard "continuous popping " descending stairs in L knee followed by swelling of vein anteriorly with subsequent bruising Constitutional: no Fever, chills, sweats, change in weight Musculoskeletal:no Muscle cramp or pain; no  joint stiffness or redness; slight swelling of patellar area Skin:no  Rash, color change now Neuro: Weakness only in joint ; no incontinence (stool/urine), numbness and tingling. Now burning constant @ patella & pressure in popliteal space Heme:L inguinal  Lymphadenopathy for first 10 days; no  abnormal bruising or clotting Treatment/response:Xrays revealed "mild arthritis"; NSAIDS of no benefit     Review of Systems     Objective:   Physical Exam General appearance is one of good health and nourishment.  without lymphadenopathy about the head, neck, or axilla. All pulses intact without  bruits .No ischemic skin changes.  Musculoskeletal/Extremities: Range of motion; stability; muscle strength; and  muscle tone are normal. Nail health is good. There are  no significant deformities of the digits. No cyanosis, clubbing or edema are present. There is slight ballotability of the left patella; with range of motion there is a recurrent clicking in the left knee. There is minimal crepitus on the right. Homans sign is negative. She limps on the left when she walks. .       Assessment & Plan:  #1 she probably has some loose cartilage fragments in the left knee with associated effusion and possible associated popliteal cyst  Plan: Orthopedic consultation as indicated. Pending that evaluation she'll be placed on Celebrex every 12 hours.

## 2012-04-24 ENCOUNTER — Other Ambulatory Visit (HOSPITAL_COMMUNITY)
Admission: RE | Admit: 2012-04-24 | Discharge: 2012-04-24 | Disposition: A | Discharge status: Home / Self Care | Payer: BC Managed Care – PPO | Source: Ambulatory Visit | Attending: Obstetrics and Gynecology | Admitting: Obstetrics and Gynecology

## 2012-04-24 ENCOUNTER — Other Ambulatory Visit: Payer: Self-pay | Admitting: Obstetrics and Gynecology

## 2012-04-24 DIAGNOSIS — Z01419 Encounter for gynecological examination (general) (routine) without abnormal findings: Secondary | ICD-10-CM | POA: Insufficient documentation

## 2012-04-24 DIAGNOSIS — N760 Acute vaginitis: Secondary | ICD-10-CM | POA: Insufficient documentation

## 2012-07-03 ENCOUNTER — Other Ambulatory Visit (HOSPITAL_COMMUNITY): Payer: Self-pay | Admitting: Obstetrics and Gynecology

## 2012-07-03 DIAGNOSIS — Z1231 Encounter for screening mammogram for malignant neoplasm of breast: Secondary | ICD-10-CM

## 2012-07-19 ENCOUNTER — Ambulatory Visit (HOSPITAL_COMMUNITY)
Admission: RE | Admit: 2012-07-19 | Discharge: 2012-07-19 | Disposition: A | Discharge status: Home / Self Care | Payer: BC Managed Care – PPO | Source: Ambulatory Visit | Attending: Obstetrics and Gynecology | Admitting: Obstetrics and Gynecology

## 2012-07-19 DIAGNOSIS — Z1231 Encounter for screening mammogram for malignant neoplasm of breast: Secondary | ICD-10-CM | POA: Insufficient documentation

## 2012-08-01 HISTORY — PX: OTHER SURGICAL HISTORY: SHX169

## 2012-08-02 ENCOUNTER — Encounter: Payer: Self-pay | Admitting: Internal Medicine

## 2012-08-02 ENCOUNTER — Ambulatory Visit (INDEPENDENT_AMBULATORY_CARE_PROVIDER_SITE_OTHER): Payer: BC Managed Care – PPO | Admitting: Internal Medicine

## 2012-08-02 VITALS — BP 118/80 | HR 63 | Temp 98.1°F | Wt 199.4 lb

## 2012-08-02 DIAGNOSIS — K130 Diseases of lips: Secondary | ICD-10-CM

## 2012-08-02 DIAGNOSIS — B001 Herpesviral vesicular dermatitis: Secondary | ICD-10-CM

## 2012-08-02 DIAGNOSIS — L259 Unspecified contact dermatitis, unspecified cause: Secondary | ICD-10-CM

## 2012-08-02 MED ORDER — VALACYCLOVIR HCL 1 G PO TABS: ORAL_TABLET | ORAL | Status: DC

## 2012-08-02 NOTE — Progress Notes (Signed)
  Subjective:    Patient ID: Terri Roberts, female    DOB: 03-Apr-1961, 52 y.o.   MRN: [id number]  HPI Symptoms began around Thanksgiving as chapped lips for which she used a lip gloss which belonged to her mother. This resulted in swelling of the lips. She subsequently noted tiny bumps over the lips for which she used Abreva. This did not result in any improvement. She [place] moisturizing lotion which was of some benefit. Blistex balm aggravated the condition again.  At this time she is using coconut oil, honey, vitamin E topically, and butter for the swelling.She is not on ACE-I    Review of Systems  Pruritis:yes w/o other extrinsic  Symptoms. Zyrtec did not help Tenderness: yes Feeling ill: nausea Fever: no Mouth lesions: no Facial/tongue swelling/difficulty breathing: no     Objective:   Physical Exam General appearance:good health ;well nourished; no acute distress or increased work of breathing is present.  No  lymphadenopathy about the head, neck, or axilla noted.   Eyes: No conjunctival inflammation or lid edema is present. There is no scleral icterus.  Ears:  External ear exam shows no significant lesions or deformities.  Otoscopic examination reveals impactions bilaterally  Nose:  External nasal examination shows no deformity or inflammation. Nasal mucosa are pink and moist without lesions or exudates. No septal dislocation or deviation.No obstruction to airflow.   Oral exam: Dental hygiene is good. Lips are edematous with slight cheilosis @ corners. Papules of chin immediately below lip margin .There is no oropharyngeal erythema or exudate noted.   Neck:  No deformities,  masses, or tenderness noted.     Heart:  Normal rate and regular rhythm. S1 and S2 normal without gallop, murmur, click, rub or other extra sounds.   Lungs:Chest clear to auscultation; no wheezes, rhonchi,rales ,or rubs present.No increased work of breathing.    Extremities:  No cyanosis, edema, or  clubbing  noted    Skin: Warm & dry           Assessment & Plan:  #1 cheilosis #2contact dermatitis Plan: See orders and recommendations

## 2012-08-02 NOTE — Patient Instructions (Addendum)
Dip gauze in  sterile saline and applied to the lips twice a day.  The saline can be purchased at the drugstore or you can make your own .Boil cup of salt in a gallon of water. Store mixture  in a clean container.Report Warning  signs as discussed (red streaks, pus, fever, increasing pain).  Apply A&D ointment to corners of mouth twice a day .  Use hypoallergenic cleansing motions.

## 2012-11-01 ENCOUNTER — Ambulatory Visit (INDEPENDENT_AMBULATORY_CARE_PROVIDER_SITE_OTHER): Payer: BC Managed Care – PPO | Admitting: Internal Medicine

## 2012-11-01 ENCOUNTER — Encounter: Payer: Self-pay | Admitting: Internal Medicine

## 2012-11-01 VITALS — BP 128/74 | HR 69 | Temp 97.9°F | Resp 12 | Ht 63.03 in | Wt 197.0 lb

## 2012-11-01 DIAGNOSIS — Z0000 Encounter for general adult medical examination without abnormal findings: Secondary | ICD-10-CM

## 2012-11-01 DIAGNOSIS — R5381 Other malaise: Secondary | ICD-10-CM

## 2012-11-01 DIAGNOSIS — M2550 Pain in unspecified joint: Secondary | ICD-10-CM

## 2012-11-01 DIAGNOSIS — Z23 Encounter for immunization: Secondary | ICD-10-CM

## 2012-11-01 DIAGNOSIS — M3500 Sicca syndrome, unspecified: Secondary | ICD-10-CM

## 2012-11-01 LAB — LIPID PANEL
Total CHOL/HDL Ratio: 3
Triglycerides: 38 mg/dL (ref 0.0–149.0)

## 2012-11-01 LAB — BASIC METABOLIC PANEL
BUN: 19 mg/dL (ref 6–23)
CO2: 22 mEq/L (ref 19–32)
Chloride: 109 mEq/L (ref 96–112)
Creatinine, Ser: 0.8 mg/dL (ref 0.4–1.2)

## 2012-11-01 LAB — HEPATIC FUNCTION PANEL
ALT: 15 U/L (ref 0–35)
AST: 18 U/L (ref 0–37)
Albumin: 4 g/dL (ref 3.5–5.2)
Alkaline Phosphatase: 47 U/L (ref 39–117)
Total Protein: 7.2 g/dL (ref 6.0–8.3)

## 2012-11-01 LAB — CBC WITH DIFFERENTIAL/PLATELET
Basophils Relative: 0.6 % (ref 0.0–3.0)
Eosinophils Absolute: 0.1 10*3/uL (ref 0.0–0.7)
HCT: 35.9 % — ABNORMAL LOW (ref 36.0–46.0)
Lymphs Abs: 2.7 10*3/uL (ref 0.7–4.0)
MCHC: 32.8 g/dL (ref 30.0–36.0)
MCV: 93.3 fl (ref 78.0–100.0)
Monocytes Absolute: 0.3 10*3/uL (ref 0.1–1.0)
Neutrophils Relative %: 46.1 % (ref 43.0–77.0)
Platelets: 244 10*3/uL (ref 150.0–400.0)
RBC: 3.85 Mil/uL — ABNORMAL LOW (ref 3.87–5.11)

## 2012-11-01 NOTE — Patient Instructions (Addendum)
Preventive Health Care: Exercise  30-45  minutes a day, 3-4 days a week. Walking is especially valuable in preventing Osteoporosis.Water aerobics is alternative  option. Eat a low-fat diet with lots of fruits and vegetables, up to 7-9 servings per day. This would eliminate need for vitamin supplements for most individuals. Consume less than 30 grams of sugar per day from foods & drinks with High Fructose Corn Syrup as #2,3 or #4 on label. The legal document "[place] Will " verifies decisions concerning your health care. As per the Standard of Care , screening Colonoscopy recommended @ 50 & every 5-10 years thereafter . More frequent monitor would be dictated by family history or findings @ Colonoscopy Take the EKG to any emergency room or preop visits. There are MINOR  nonspecific changes; as long as there is no new change these are not clinically significant . If the old EKG is not available for comparison; it may result in unnecessary hospitalization for observation with significant unnecessary expense.   If you activate the  My Chart system; lab & Xray results will be released directly  to you as soon as I review & address these through the computer. If you choose not to sign up for My Chart within 36 hours of labs being drawn; results will be reviewed & interpretation added before being copied & mailed, causing a delay in getting the results to you.If you do not receive that report within 7-10 days ,please call. Additionally you can use this system to gain direct  access to your records  if  out of town or @ an office of a  physician who is not in  the My Chart network.  This improves continuity of care & places you in control of your medical record.  Review and correct the record as indicated. Please share record with all medical staff seen.

## 2012-11-01 NOTE — Progress Notes (Signed)
Subjective:    Patient ID: Terri Roberts, female    DOB: Dec 08, 1960, 52 y.o.   MRN: [id number]  HPI She is here for a physical;acute issues include fatigue, worse exertionally & diffuse arthralgias.Dr Chalmers Cater & Dr Ouida Sills have evaluated these symptoms.  Biopsy did reveal sure gums; Dr. Ouida Sills states that her rheumatoid arthritis studies are negative     Review of Systems The pain began to progress several months ago diffusely ,involving shoulders, hands, hips, knees, and feet. There was no specific triggers such as a viral infection.It is described as aching  up to level 7. The discomfort lasts  minutes with use; worse in ams especially. Associated signs/symptoms include isolated  swelling & stiffness w/o skin color change or temperature change. The pain was treated with topical alcohol &NSAIDS  ; response was partial . Some muscle cramps & pain also  In lower extremities > upper extremities. Some numbness but more tingling in hands & feet.  Constitutional: no fever, chills, sweats, change in weight  Neuro: no weakness; incontinence (stool/urine).  Heme:no lymphadenopathy; abnormal bruising or bleeding                                                             Objective:   Physical Exam Gen.: Healthy and well-nourished in appearance. Alert, appropriate and cooperative throughout exam. Appears younger than stated age  Head: Normocephalic without obvious abnormalities; no alopecia  Eyes: No corneal or conjunctival inflammation noted. Pupils equal round reactive to light and accommodation.  Extraocular motion intact. Vision grossly normal with lenses Ears: External  ear exam reveals no significant lesions or deformities. Canals clear .TMs normal. Hearing is grossly normal bilaterally. Nose: External nasal exam reveals no deformity or inflammation. Nasal mucosa are pink and moist. No lesions or exudates noted.   Mouth: Oral mucosa and oropharynx reveal no lesions or exudates. Teeth  in good repair. Neck: No deformities, masses, or tenderness noted. Range of motion decreased. Thyroid absent; minor SQ changes. Lungs: Normal respiratory effort; chest expands symmetrically. Lungs are clear to auscultation without rales, wheezes, or increased work of breathing. Heart: Normal rate and rhythm. Normal S1 and S2. No gallop, click, or rub. S4 w/o murmur. Abdomen: Bowel sounds normal; abdomen soft and nontender. No masses, organomegaly or hernias noted. Genitalia: As per Gyn                                  Musculoskeletal/extremities: No clubbing, cyanosis, edema, or significant extremity  deformity noted. Range of motion normal .Tone & strength  Normal.LUE in wrist support. Joints normal . Nail health good. Crepitus knees w/o effusion. Able to lie down & sit up w/o help. Negative SLR bilaterally Vascular: Carotid, R radial artery, dorsalis pedis and  posterior tibial pulses are full and equal. No bruits present. Neurologic: Alert and oriented x3. Deep tendon reflexes symmetrical but decreased.      Skin: Intact without suspicious lesions or rashes. Lymph: No cervical, axillary lymphadenopathy present. Psych: Mood and affect are normal. Normally interactive  Assessment & Plan:  #1 comprehensive physical exam; no acute findings #2 fatigue  #3 polyarthragia  Plan: see Orders  & Recommendations

## 2012-11-14 ENCOUNTER — Encounter: Payer: Self-pay | Admitting: Internal Medicine

## 2012-11-19 ENCOUNTER — Other Ambulatory Visit (INDEPENDENT_AMBULATORY_CARE_PROVIDER_SITE_OTHER): Payer: BC Managed Care – PPO

## 2012-11-19 DIAGNOSIS — D649 Anemia, unspecified: Secondary | ICD-10-CM

## 2012-11-19 LAB — IBC PANEL
Iron: 69 ug/dL (ref 42–145)
Transferrin: 201.6 mg/dL — ABNORMAL LOW (ref 212.0–360.0)

## 2012-11-19 LAB — CBC WITH DIFFERENTIAL/PLATELET
Basophils Absolute: 0 10*3/uL (ref 0.0–0.1)
HCT: 34.8 % — ABNORMAL LOW (ref 36.0–46.0)
Lymphocytes Relative: 47.8 % — ABNORMAL HIGH (ref 12.0–46.0)
Lymphs Abs: 2.8 10*3/uL (ref 0.7–4.0)
Monocytes Relative: 6.4 % (ref 3.0–12.0)
Neutrophils Relative %: 44.3 % (ref 43.0–77.0)
Platelets: 221 10*3/uL (ref 150.0–400.0)
RDW: 12.8 % (ref 11.5–14.6)
WBC: 5.8 10*3/uL (ref 4.5–10.5)

## 2012-11-19 LAB — VITAMIN B12: Vitamin B-12: 374 pg/mL (ref 211–911)

## 2012-11-20 ENCOUNTER — Encounter: Payer: Self-pay | Admitting: Internal Medicine

## 2012-11-21 ENCOUNTER — Ambulatory Visit: Payer: BC Managed Care – PPO

## 2012-11-21 ENCOUNTER — Telehealth: Payer: Self-pay | Admitting: *Deleted

## 2012-11-21 DIAGNOSIS — D649 Anemia, unspecified: Secondary | ICD-10-CM

## 2012-11-21 NOTE — Telephone Encounter (Signed)
Spoke with pt advised that she would need to come back in for fasting labs of calcium and intact PTH.

## 2012-11-22 ENCOUNTER — Other Ambulatory Visit: Payer: BC Managed Care – PPO

## 2012-11-23 LAB — PTH, INTACT AND CALCIUM: Calcium, Total (PTH): 7.5 mg/dL — ABNORMAL LOW (ref 8.4–10.5)

## 2013-06-06 ENCOUNTER — Other Ambulatory Visit: Payer: Self-pay

## 2013-08-07 ENCOUNTER — Encounter: Payer: Self-pay | Admitting: Internal Medicine

## 2013-08-07 ENCOUNTER — Ambulatory Visit (INDEPENDENT_AMBULATORY_CARE_PROVIDER_SITE_OTHER): Payer: BC Managed Care – PPO | Admitting: Internal Medicine

## 2013-08-07 VITALS — BP 110/73 | HR 97 | Temp 98.8°F | Resp 16 | Wt 191.0 lb

## 2013-08-07 DIAGNOSIS — J111 Influenza due to unidentified influenza virus with other respiratory manifestations: Secondary | ICD-10-CM

## 2013-08-07 LAB — POCT INFLUENZA A/B: Influenza A, POC: POSITIVE

## 2013-08-07 MED ORDER — OSELTAMIVIR PHOSPHATE 75 MG PO CAPS: 75.0000 mg | ORAL_CAPSULE | Freq: Two times a day (BID) | ORAL | Status: DC

## 2013-08-07 NOTE — Progress Notes (Signed)
   Subjective:    Patient ID: Terri Roberts, female    DOB: 07-18-1961, 53 y.o.   MRN: [id number]  HPI   Symptoms began 08/05/13 as stuffy head runny nose. This has progressed to the point that she has a panic sensation of difficulty breathing at night. She's also had cough, chills, and sweats. Major symptoms include fatigue with diffuse arthralgias and myalgias.  She also has had some sneezing. She has seen some yellow discharge which she is able to expectorate  She has had some discomfort in the maxillary sinus area   She has no history of asthma or other lung disease. She's never smoked.  She did not take the flu  shot.    Review of Systems She denies frontal sinus pain, dental pain, otic pain, or otic discharge. She's had no wheezing with shortness of breath.      Objective:   Physical Exam General appearance:miserable but in no acute distress or increased work of breathing is present.  No  lymphadenopathy about the head, neck, or axilla noted.   Eyes: No conjunctival inflammation or lid edema is present. There is no scleral icterus.  Ears:  External ear exam shows no significant lesions or deformities.  Otoscopic examination reveals wax on L ; R TM normal. Nose:  External nasal examination shows no deformity or inflammation. Nasal mucosa are boggy & moist without lesions or exudates. No septal dislocation or deviation.Some obstruction to airflow.   Oral exam: Dental hygiene is good; lips and gums are healthy appearing.There is no oropharyngeal erythema or exudate noted.   Neck:  No deformities,  masses, or tenderness noted.   Supple with full range of motion without pain (documented).   Heart:  Normal rate and regular rhythm. S1 and S2 normal without gallop, murmur, click, rub or other extra sounds.   Lungs:Chest clear to auscultation; no wheezes, rhonchi,rales ,or rubs present.No increased work of breathing.    Extremities:  No cyanosis, edema, or clubbing  noted     Skin: Warm & dry w/o tenting.         Assessment & Plan:  #1 viral syndrome; monitor for superimposed secondary infection See orders

## 2013-08-07 NOTE — Addendum Note (Signed)
Addended by: Murtis Sink A on: 08/07/2013 04:44 PM   Modules accepted: Orders

## 2013-08-07 NOTE — Patient Instructions (Signed)

## 2013-08-07 NOTE — Progress Notes (Signed)
Pre-visit discussion using our clinic review tool. No additional management support is needed unless otherwise documented below in the visit note.  

## 2013-09-30 ENCOUNTER — Other Ambulatory Visit: Payer: Self-pay | Admitting: Obstetrics and Gynecology

## 2013-09-30 ENCOUNTER — Other Ambulatory Visit (HOSPITAL_COMMUNITY)
Admission: RE | Admit: 2013-09-30 | Discharge: 2013-09-30 | Disposition: A | Discharge status: Home / Self Care | Payer: BC Managed Care – PPO | Source: Ambulatory Visit | Attending: Obstetrics and Gynecology | Admitting: Obstetrics and Gynecology

## 2013-09-30 DIAGNOSIS — R87810 Cervical high risk human papillomavirus (HPV) DNA test positive: Secondary | ICD-10-CM | POA: Insufficient documentation

## 2013-09-30 DIAGNOSIS — Z124 Encounter for screening for malignant neoplasm of cervix: Secondary | ICD-10-CM | POA: Insufficient documentation

## 2013-09-30 DIAGNOSIS — Z1151 Encounter for screening for human papillomavirus (HPV): Secondary | ICD-10-CM | POA: Insufficient documentation

## 2014-05-06 ENCOUNTER — Ambulatory Visit (INDEPENDENT_AMBULATORY_CARE_PROVIDER_SITE_OTHER): Payer: BC Managed Care – PPO | Admitting: Internal Medicine

## 2014-05-06 ENCOUNTER — Encounter: Payer: Self-pay | Admitting: Internal Medicine

## 2014-05-06 VITALS — BP 118/72 | HR 106 | Temp 98.2°F | Wt 192.1 lb

## 2014-05-06 DIAGNOSIS — M2550 Pain in unspecified joint: Secondary | ICD-10-CM

## 2014-05-06 DIAGNOSIS — Z23 Encounter for immunization: Secondary | ICD-10-CM

## 2014-05-06 DIAGNOSIS — M3500 Sicca syndrome, unspecified: Secondary | ICD-10-CM

## 2014-05-06 MED ORDER — TRAMADOL HCL 50 MG PO TABS: ORAL_TABLET | ORAL | Status: DC

## 2014-05-06 NOTE — Patient Instructions (Signed)
   Please assess your response to the medication at bedtime. Unlike Motrin this medication Is not at high risk to cause gastritis or ulcer.

## 2014-05-06 NOTE — Progress Notes (Signed)
   Subjective:    Patient ID: Terri Roberts, female    DOB: 1961/03/10, 53 y.o.   MRN: [id number]  HPI    She's had difficulty sleeping due to diffuse body pain involving multiple joints for several months. This is chiefly her hips and knees but can involve the elbow on occasion as well as the neck and upper back  She is followed by Dr.Mizra, Rheumatology at [facility]. She last saw him in July when he prescribed Celebrex for the pain. This was of no benefit  She was also in a drug study at [facility] which was completed in June of this year.  She is taking Motrin 3-4 per week, usually at bedtime.    Review of Systems   She denies any associated rash, joint redness or swelling.  She has no trouble going to sleep but awakens due to the discomfort which can be up to a level VII. She will subsequently toss and turn all night resulting in daytime fatigue  She denies any sleep issues such as excessive snoring or sleep apnea. She has no restless leg symptoms & no  nightmares.       Objective:   Physical Exam    Positive or pertinent findings include:  As per CDC Guidelines ,Epic documents obesity as being present . She does have some fusiform changes of her knees with minimal crepitus. I cannot appreciate an effusion.  General appearance :adequately nourished; in no distress. Eyes: No conjunctival inflammation or scleral icterus is present. Oral exam: Dental hygiene is good. Lips and gums are healthy appearing.There is no oropharyngeal erythema or exudate noted.  Heart:  Normal rate and regular rhythm. S1 and S2 normal without gallop, murmur, click, rub or other extra sounds   Lungs:Chest clear to auscultation; no wheezes, rhonchi,rales ,or rubs present.No increased work of breathing.  Abdomen: bowel sounds normal, soft and non-tender without masses, organomegaly or hernias noted.  No guarding or rebound. No flank tenderness to percussion. Vascular : no bruits present. Skin:Warm &  dry.  Intact without suspicious lesions or rashes ; no tenting Lymphatic: No lymphadenopathy is noted about the head, neck, axilla              Assessment & Plan:  #1 arthralgia, multiple joints #2 Sjogren's Plan: trial of Tramadol qhs to decrease gastritis/ ulcer risk Reassessment by Dr Meryl Dare; R/O development of second collagen vascular process

## 2014-05-06 NOTE — Progress Notes (Signed)
Pre visit review using our clinic review tool, if applicable. No additional management support is needed unless otherwise documented below in the visit note. 

## 2014-05-07 ENCOUNTER — Telehealth: Payer: Self-pay

## 2014-05-07 NOTE — Telephone Encounter (Signed)
10.6.15 office note has been faxed to Dr Meryl Dare

## 2014-05-07 NOTE — Telephone Encounter (Signed)
Message copied by Shelly Coss on Wed May 07, 2014  8:18 AM ------      Message from: Hendricks Limes      Created: Tue May 06, 2014  5:39 PM       Please FAX office visit  to Dr Meryl Dare, [facility] ------

## 2015-03-04 ENCOUNTER — Ambulatory Visit (INDEPENDENT_AMBULATORY_CARE_PROVIDER_SITE_OTHER): Payer: 59 | Admitting: Internal Medicine

## 2015-03-04 ENCOUNTER — Telehealth: Payer: Self-pay | Admitting: Internal Medicine

## 2015-03-04 ENCOUNTER — Encounter: Payer: Self-pay | Admitting: Internal Medicine

## 2015-03-04 ENCOUNTER — Other Ambulatory Visit (INDEPENDENT_AMBULATORY_CARE_PROVIDER_SITE_OTHER): Payer: 59

## 2015-03-04 VITALS — BP 104/70 | HR 85 | Temp 98.1°F | Resp 16 | Wt 207.0 lb

## 2015-03-04 DIAGNOSIS — E892 Postprocedural hypoparathyroidism: Secondary | ICD-10-CM | POA: Diagnosis not present

## 2015-03-04 DIAGNOSIS — M797 Fibromyalgia: Secondary | ICD-10-CM | POA: Insufficient documentation

## 2015-03-04 DIAGNOSIS — M79661 Pain in right lower leg: Secondary | ICD-10-CM

## 2015-03-04 DIAGNOSIS — M79662 Pain in left lower leg: Secondary | ICD-10-CM

## 2015-03-04 DIAGNOSIS — E890 Postprocedural hypothyroidism: Secondary | ICD-10-CM | POA: Diagnosis not present

## 2015-03-04 LAB — CK: CK TOTAL: 211 U/L — AB (ref 7–177)

## 2015-03-04 MED ORDER — MELOXICAM 7.5 MG PO TABS: ORAL_TABLET | ORAL | Status: DC

## 2015-03-04 NOTE — Telephone Encounter (Signed)
Spoke with pt, she has an appt scheduled for today and will get lab work once seen

## 2015-03-04 NOTE — Progress Notes (Signed)
Pre visit review using our clinic review tool, if applicable. No additional management support is needed unless otherwise documented below in the visit note. 

## 2015-03-04 NOTE — Telephone Encounter (Signed)
Patient would like for you to call her, she is having trouble with her legs.

## 2015-03-04 NOTE — Progress Notes (Signed)
   Subjective:    Patient ID: Milinda Antis, female    DOB: Mar 23, 1961, 54 y.o.   MRN: [id number]  HPI She describes tenderness in the legs from the knees down to the ankles. Symptoms started 02/24/15 below the calves to the ankles. As of 8/1 this had moved up to the level of the knees. There was no specific trigger or injury for this. She describes constant pain in this area. The symptoms were more intense this morning when she was getting out of bed. She describes a sensation of "too much blood going into the legs and causing tightness". There was no significant travel prior to 7/26 but she did go on a 3 hour car trip during the period 7/29-8/1.  The pain is now worse walking. There is tenderness to touch. Tylenol and tramadol were of no benefit for the pain.  She has severe cramping in her left calf several weeks ago.   Her Endocrinologist is treating her for low calcium with Calcitrol.  On 01/06/15 her serum calcium was 6.7 (8.7-10.2). The PTH, intact on the same date was 8 (15-65). The calcium is risen to 9 as of 7/21.  She does have history of Sjogren's syndrome; fibromyalgia; postprocedural hypothyroidism and posterior procedure hypoparathyroidism.     Review of Systems  Some "fluttering" yesterday. Chest pain,  exertional dyspnea, paroxysmal nocturnal dyspnea, hemoptysis claudication or edema are absent. Stools were loose twice this week.  The fibromyalgia is manifested by pain in her neck and hips as well as burning in the neck and legs.       Objective:   Physical Exam Pertinent or positive findings include: She moves very slowly due to the discomfort in her lower extremities. She describes exquisite tenderness to even light touch over the lower extremities. There is no increase in temperature or change in color over the lower extremities. Bevelyn Buckles is suggested to be positive in the right lower extremity. Circumference of the left calf was 39.7 cm; right calf 41.4 cm. Knee  reflexes 0-one half plus and equal   General appearance :adequately nourished; in no distress.  Eyes: No conjunctival inflammation or scleral icterus is present.  Oral exam:  Lips and gums are healthy appearing.There is no oropharyngeal erythema or exudate noted. Dental hygiene is good.  Heart:  Normal rate and regular rhythm. S1 and S2 normal without gallop, murmur, click, rub or other extra sounds    Lungs:Chest clear to auscultation; no wheezes, rhonchi,rales ,or rubs present.No increased work of breathing.   Abdomen: bowel sounds normal, soft and non-tender without masses, organomegaly or hernias noted.  No guarding or rebound.   Vascular : all pulses equal ; no bruits present.  Skin:Warm & dry.  Intact without suspicious lesions or rashes ; no tenting   Lymphatic: No lymphadenopathy is noted about the head, neck, axilla, or inguinal areas.   Neuro: Strength, tone  normal.          Assessment & Plan:#1 severe calf pain bilaterally  #2 hypocalcemia, corrected  #3 postoperative hypothyroidism and hypoparathyroidism.  See orders and recommendations

## 2015-03-04 NOTE — Patient Instructions (Signed)
  Your next office appointment will be determined based upon review of your pending labs. Those written interpretation of the lab results and instructions will be transmitted to you by My Chart  Critical results will be called.   Followup as needed for any active or acute issue. Please report any significant change in your symptoms. 

## 2015-03-05 ENCOUNTER — Ambulatory Visit (HOSPITAL_COMMUNITY)
Admission: RE | Admit: 2015-03-05 | Discharge: 2015-03-05 | Disposition: A | Discharge status: Home / Self Care | Payer: 59 | Source: Ambulatory Visit | Attending: Cardiovascular Disease | Admitting: Cardiovascular Disease

## 2015-03-05 ENCOUNTER — Other Ambulatory Visit: Payer: Self-pay | Admitting: Internal Medicine

## 2015-03-05 DIAGNOSIS — M79669 Pain in unspecified lower leg: Secondary | ICD-10-CM | POA: Diagnosis present

## 2015-03-05 DIAGNOSIS — M79661 Pain in right lower leg: Secondary | ICD-10-CM | POA: Insufficient documentation

## 2015-03-05 DIAGNOSIS — M79662 Pain in left lower leg: Secondary | ICD-10-CM

## 2015-03-05 DIAGNOSIS — R791 Abnormal coagulation profile: Secondary | ICD-10-CM | POA: Diagnosis not present

## 2015-03-05 DIAGNOSIS — R7989 Other specified abnormal findings of blood chemistry: Secondary | ICD-10-CM

## 2015-03-05 LAB — D-DIMER, QUANTITATIVE: D-Dimer, Quant: 0.53 ug/mL-FEU — ABNORMAL HIGH (ref 0.00–0.48)

## 2015-03-08 ENCOUNTER — Other Ambulatory Visit: Payer: Self-pay | Admitting: Internal Medicine

## 2015-03-08 ENCOUNTER — Encounter: Payer: Self-pay | Admitting: Internal Medicine

## 2015-03-08 DIAGNOSIS — IMO0001 Reserved for inherently not codable concepts without codable children: Secondary | ICD-10-CM

## 2015-03-08 DIAGNOSIS — M3500 Sicca syndrome, unspecified: Secondary | ICD-10-CM

## 2015-03-08 DIAGNOSIS — E892 Postprocedural hypoparathyroidism: Secondary | ICD-10-CM

## 2015-03-08 DIAGNOSIS — M79661 Pain in right lower leg: Secondary | ICD-10-CM

## 2015-03-08 DIAGNOSIS — M79662 Pain in left lower leg: Secondary | ICD-10-CM

## 2015-03-11 NOTE — Telephone Encounter (Signed)
Please advise 

## 2015-06-02 ENCOUNTER — Telehealth: Payer: Self-pay | Admitting: Internal Medicine

## 2015-06-02 NOTE — Telephone Encounter (Signed)
Got scheduled  °

## 2015-06-02 NOTE — Telephone Encounter (Signed)
Patient would like to know if Dr. Linna Darner will see her daughter. Daughter's name is Terri Roberts (dob 08/30/1993).  States she knows that Dr. Linna Darner is retiring but would like for him to see her daughter.

## 2015-06-02 NOTE — Telephone Encounter (Signed)
OK if she understands my status I have known Terri Roberts for decades

## 2015-06-10 ENCOUNTER — Encounter: Payer: Self-pay | Admitting: Neurology

## 2015-06-10 ENCOUNTER — Ambulatory Visit (INDEPENDENT_AMBULATORY_CARE_PROVIDER_SITE_OTHER): Payer: 59 | Admitting: Neurology

## 2015-06-10 VITALS — BP 132/76 | HR 93 | Ht 63.0 in | Wt 210.0 lb

## 2015-06-10 DIAGNOSIS — R202 Paresthesia of skin: Secondary | ICD-10-CM | POA: Diagnosis not present

## 2015-06-10 DIAGNOSIS — M542 Cervicalgia: Secondary | ICD-10-CM

## 2015-06-10 DIAGNOSIS — M2550 Pain in unspecified joint: Secondary | ICD-10-CM | POA: Diagnosis not present

## 2015-06-10 MED ORDER — GABAPENTIN 300 MG PO CAPS: 300.0000 mg | ORAL_CAPSULE | Freq: Three times a day (TID) | ORAL | Status: DC

## 2015-06-10 MED ORDER — TRAMADOL HCL 50 MG PO TABS: 50.0000 mg | ORAL_TABLET | Freq: Two times a day (BID) | ORAL | Status: DC | PRN

## 2015-06-10 NOTE — Progress Notes (Signed)
PATIENT: Terri Roberts DOB: February 12, 1961  Chief Complaint  Patient presents with  . Peripheral Neuropathy    Reports numbness, tingling and numbness in her bilateral arms, hands, legs and feet.  Her symptoms have been present for the last 2-3 months.  She is currently on duloxetine, tramadol and baclofen for pain control.     HISTORICAL  Terri Roberts is a 54 years old right-handed female, seen in refer by her rheumatologist Valinda Party and primary care physician Dr. Unice Cobble for evaluation of bilateral hands leg and feet paresthesia  I have reviewed and summarized her most recent office note in November third 2016, she had a history of migraine, hypothyroidism, Sjogren's disease, is taking thyroid supplement, Topamax 400 mg a day, tramadol, Cymbalta,  She does desk job, she complains of bilateral arms and hands paresthesia since August 2016, it happened without any triggers, she complains of numbness tingling needle prick in her arms and legs, as if there needle prick across the neck, she could not wear her neck jewelry anymore, she also complains of intermittent right thumb shaking, intermittent body jerking movement, especially during her sleep, sometimes woke her up from sleep.  She also describes discoloration of her bilateral leg, blotching discoloration, worsening after bearing weight, she felt a burning pain  Above symptoms were intermittent, lasting for few minutes, but multiple episodes during the day.  She denied gait difficulty, she had a history of left knee surgery, complains of left knee irritation, she denies low back pain  REVIEW OF SYSTEMS: Full 14 system review of systems performed and notable only for trouble swallowing, urination problems, blurred vision, joint pain, achy muscles, memory loss, headaches, weakness, slurred speech, difficulty swallowing, not enough sleep, decreased energy  ALLERGIES: No Known Allergies  HOME MEDICATIONS: Current  Outpatient Prescriptions  Medication Sig Dispense Refill  . baclofen (LIORESAL) 10 MG tablet Take 10 mg by mouth every other day. [id number] by Dr.Freeman    . Calcium Citrate-Vitamin D (CALCIUM + D PO) Take by mouth.    . cycloSPORINE (RESTASIS) 0.05 % ophthalmic emulsion Place 1 drop into both eyes 2 (two) times daily.    . DULoxetine (CYMBALTA) 60 MG capsule Take 60 mg by mouth daily.    . hydroxychloroquine (PLAQUENIL) 200 MG tablet Take by mouth 2 (two) times daily.    Marland Kitchen levothyroxine (SYNTHROID, LEVOTHROID) 175 MCG tablet Take 150 mcg by mouth. 1 by mouth daily EXCEPT none on SUN    . PILOCARPINE HCL PO Take 5 mg by mouth 3 (three) times daily.     Marland Kitchen topiramate (TOPAMAX) 200 MG tablet Take 200 mg by mouth. 2 by mouth 2 x daily    . traMADol (ULTRAM) 50 MG tablet 1 qhs prn pain 30 tablet 0   No current facility-administered medications for this visit.    PAST MEDICAL HISTORY: Past Medical History  Diagnosis Date  . Migraine     Dr Domingo Cocking  . Thyroid nodule   . Sjogren's syndrome ([place])     Dr Ouida Sills  . Peripheral neuropathy ([place])   . Fibromyalgia     PAST SURGICAL HISTORY: Past Surgical History  Procedure Laterality Date  . Mandible surgery      TMJ   . Tubal ligation    . Thyroidectomy  06/2009    benign nodule  . Intrauterine ablation  2011    Dr Raphael Gibney ( now seeing Dr Christophe Louis)  . Lip biopsy  08/2012    Dr  Teoh,ENT    FAMILY HISTORY: Family History  Problem Relation Age of Onset  . Stroke Mother 22  . Diabetes Mother   . Stroke Sister 56  . Hypertension Sister   . Heart attack Maternal Grandfather     ? age  . Heart attack Maternal Grandmother     ? age  . Diabetes Sister   . Hypertension Sister   . Diabetes Brother   . Cancer Neg Hx   . AAA (abdominal aortic aneurysm) Father     SOCIAL HISTORY:  Social History   Social History  . Marital Status: Widowed    Spouse Name: N/A  . Number of Children: 2  . Years of Education: 14   Occupational  History  . Product Compliance Specialist    Social History Main Topics  . Smoking status: Never Smoker   . Smokeless tobacco: Not on file  . Alcohol Use: 1.8 oz/week    3 Shots of liquor per week     Comment:  3 shots of liquor per week  . Drug Use: No  . Sexual Activity: Not on file   Other Topics Concern  . Not on file   Social History Narrative   Lives at home with her daughter.   Right-handed.   One cup caffeine per day.     PHYSICAL EXAM   Filed Vitals:   06/10/15 1528  BP: 132/76  Pulse: 93  Height: 5\' 3"  (1.6 m)  Weight: 210 lb (95.255 kg)    Not recorded      Body mass index is 37.21 kg/(m^2).  PHYSICAL EXAMNIATION:  Gen: NAD, conversant, well nourised, obese, well groomed                     Cardiovascular: Regular rate rhythm, no peripheral edema, warm, nontender. Eyes: Conjunctivae clear without exudates or hemorrhage Neck: Supple, no carotid bruise. Pulmonary: Clear to auscultation bilaterally   NEUROLOGICAL EXAM:  MENTAL STATUS: Speech:    Speech is normal; fluent and spontaneous with normal comprehension.  Cognition:     Orientation to time, place and person     Normal recent and remote memory     Normal Attention span and concentration     Normal Language, naming, repeating,spontaneous speech     Fund of knowledge   CRANIAL NERVES: CN II: Visual fields are full to confrontation. Fundoscopic exam is normal with sharp discs and no vascular changes. Pupils are round equal and briskly reactive to light. CN III, IV, VI: extraocular movement are normal. No ptosis. CN V: Facial sensation is intact to pinprick in all 3 divisions bilaterally. Corneal responses are intact.  CN VII: Face is symmetric with normal eye closure and smile. CN VIII: Hearing is normal to rubbing fingers CN IX, X: Palate elevates symmetrically. Phonation is normal. CN XI: Head turning and shoulder shrug are intact CN XII: Tongue is midline with normal movements and no  atrophy.  MOTOR: There is no pronator drift of out-stretched arms. Muscle bulk and tone are normal. Muscle strength is normal.  REFLEXES: Reflexes are 1 and symmetric at the biceps, triceps, knees, and ankles. Plantar responses are flexor.  SENSORY: Intact to light touch, pinprick, position sense, and vibration sense are intact in fingers and toes.  COORDINATION: Rapid alternating movements and fine finger movements are intact. There is no dysmetria on finger-to-nose and heel-knee-shin.    GAIT/STANCE: Posture is mildly atalgic gait, Gait is steady with normal steps, base, arm swing, and  turning. Heel and toe walking are normal. Tandem gait is normal.  Romberg is absent.  DIAGNOSTIC DATA (LABS, IMAGING, TESTING) - I reviewed patient records, labs, notes, testing and imaging myself where available.   ASSESSMENT AND PLAN  Terri Roberts is a 54 y.o. female   Intermittent paresthesia of upper lower extremity  Differentiation diagnosis including cervical myelopathy, versus peripheral neuropathy  EMG nerve conduction study  MRI of cervical spine  She has known history of cervical degenerative disc disease   Terri Roberts, M.D. Ph.D.  [facility] [address] [place], [place] [address] Ph: [phone] Fax: [phone]  CC: Valinda Party, MD primary care physician Dr. Unice Cobble

## 2015-07-08 ENCOUNTER — Ambulatory Visit (INDEPENDENT_AMBULATORY_CARE_PROVIDER_SITE_OTHER): Payer: 59

## 2015-07-08 DIAGNOSIS — R202 Paresthesia of skin: Secondary | ICD-10-CM | POA: Diagnosis not present

## 2015-07-08 DIAGNOSIS — M542 Cervicalgia: Secondary | ICD-10-CM

## 2015-07-09 ENCOUNTER — Other Ambulatory Visit: Payer: Self-pay | Admitting: Orthopaedic Surgery

## 2015-07-09 DIAGNOSIS — M545 Low back pain: Secondary | ICD-10-CM

## 2015-07-13 ENCOUNTER — Ambulatory Visit
Admission: RE | Admit: 2015-07-13 | Discharge: 2015-07-13 | Disposition: A | Discharge status: Home / Self Care | Payer: 59 | Source: Ambulatory Visit | Attending: Orthopaedic Surgery | Admitting: Orthopaedic Surgery

## 2015-07-13 ENCOUNTER — Telehealth: Payer: Self-pay | Admitting: Neurology

## 2015-07-13 DIAGNOSIS — M545 Low back pain: Secondary | ICD-10-CM

## 2015-07-13 NOTE — Telephone Encounter (Signed)
Please call patient, MRI of cervical spine showed mild spondylitic degenerative changes, will review film at her next follow-up visit  IMPRESSION: Abnormal MRI scan of cervical spine showing mild spondylitic changes throughout most prominent at C4-5 where there is broad-based disc osteophyte bulge resulting in mild canal and moderate bilateral foraminal narrowing. As compared with previous MRI from 05/02/2006 spondylitic changes appear to be slightly advanced

## 2015-07-13 NOTE — Telephone Encounter (Signed)
Spoke to patient - she is aware of results.  She will keep her appts for EMG/NCV and follow up.

## 2015-07-29 ENCOUNTER — Telehealth: Payer: Self-pay

## 2015-07-29 NOTE — Telephone Encounter (Signed)
Patient has scheduled nurse visit for flu vaccine, will be establishing care with dr burns soon

## 2015-08-04 ENCOUNTER — Ambulatory Visit (INDEPENDENT_AMBULATORY_CARE_PROVIDER_SITE_OTHER): Payer: 59

## 2015-08-04 DIAGNOSIS — Z23 Encounter for immunization: Secondary | ICD-10-CM

## 2015-08-06 ENCOUNTER — Ambulatory Visit (INDEPENDENT_AMBULATORY_CARE_PROVIDER_SITE_OTHER): Payer: Self-pay | Admitting: Neurology

## 2015-08-06 ENCOUNTER — Ambulatory Visit (INDEPENDENT_AMBULATORY_CARE_PROVIDER_SITE_OTHER): Payer: 59 | Admitting: Neurology

## 2015-08-06 DIAGNOSIS — R202 Paresthesia of skin: Secondary | ICD-10-CM | POA: Diagnosis not present

## 2015-08-06 DIAGNOSIS — Z0289 Encounter for other administrative examinations: Secondary | ICD-10-CM

## 2015-08-06 DIAGNOSIS — M542 Cervicalgia: Secondary | ICD-10-CM

## 2015-08-06 NOTE — Procedures (Signed)
   NCS (NERVE CONDUCTION STUDY) WITH EMG (ELECTROMYOGRAPHY) REPORT   STUDY DATE: January 5th 2017 PATIENT NAME: Terri Roberts DOB: 13-Mar-1961 MRN: [id number]    TECHNOLOGIST: Laretta Alstrom ELECTROMYOGRAPHER: Marcial Pacas M.D.  CLINICAL INFORMATION:  55 year old female complains of chronic neck pain, low back pain, intermittent bilateral upper and lower extremity paresthesia  FINDINGS: NERVE CONDUCTION STUDY: Bilateral median, ulnar sensory and motor responses were normal.  Left peroneal sensory response was normal. Left peroneal to EDB and tibial motor responses were normal.  NEEDLE ELECTROMYOGRAPHY: Selective needle examination was performed at left upper extremity muscles, left cervical paraspinal muscles, left lower extremity muscles and left lumbosacral paraspinal muscles.  Selected needle examination of left pronator teres, biceps, triceps, deltoid, extensor digitorum communis was normal.  There was no spontaneous activity at left cervical paraspinal muscles, left C5-6 and 7.  Selected needle examination of left lower extremity muscles, left tibialis anterior, tibialis posterior, peroneal longus, vastus lateralis, medial gastrocnemius was normal.  There was no spontaneous activity at left lumbar sacral paraspinal muscles, left L4-5 S1.  IMPRESSION:   This is a normal study.  There is no electrodiagnostic evidence of left upper or lower extremity neuropathy, there is no evidence of left cervical or lumbar radiculopathy.   INTERPRETING PHYSICIAN:   Marcial Pacas M.D. Ph.D. [facility] [address] [place], [place] [address] [phone]

## 2015-08-06 NOTE — Progress Notes (Signed)
Electrodiagnostic study is normal, there is no evidence of large fiber peripheral neuropathy, left cervical radiculopathy, or left lumbar radiculopathy

## 2015-08-10 ENCOUNTER — Ambulatory Visit: Payer: 59 | Admitting: Neurology

## 2015-09-10 ENCOUNTER — Ambulatory Visit: Payer: 59 | Admitting: Internal Medicine

## 2015-09-30 ENCOUNTER — Other Ambulatory Visit: Payer: Self-pay | Admitting: Obstetrics and Gynecology

## 2015-09-30 ENCOUNTER — Other Ambulatory Visit (HOSPITAL_COMMUNITY)
Admission: RE | Admit: 2015-09-30 | Discharge: 2015-09-30 | Disposition: A | Discharge status: Home / Self Care | Payer: 59 | Source: Ambulatory Visit | Attending: Obstetrics and Gynecology | Admitting: Obstetrics and Gynecology

## 2015-09-30 DIAGNOSIS — Z01419 Encounter for gynecological examination (general) (routine) without abnormal findings: Secondary | ICD-10-CM | POA: Insufficient documentation

## 2015-10-05 LAB — CYTOLOGY - PAP

## 2015-11-17 ENCOUNTER — Other Ambulatory Visit: Payer: Self-pay

## 2015-11-17 DIAGNOSIS — Z1231 Encounter for screening mammogram for malignant neoplasm of breast: Secondary | ICD-10-CM

## 2015-11-22 ENCOUNTER — Other Ambulatory Visit: Payer: Self-pay | Admitting: Neurology

## 2015-12-02 ENCOUNTER — Ambulatory Visit: Payer: 59

## 2015-12-15 ENCOUNTER — Ambulatory Visit
Admission: RE | Admit: 2015-12-15 | Discharge: 2015-12-15 | Disposition: A | Discharge status: Home / Self Care | Payer: 59 | Source: Ambulatory Visit

## 2015-12-15 DIAGNOSIS — Z1231 Encounter for screening mammogram for malignant neoplasm of breast: Secondary | ICD-10-CM

## 2015-12-28 ENCOUNTER — Other Ambulatory Visit: Payer: Self-pay | Admitting: Neurology

## 2016-01-12 ENCOUNTER — Encounter: Payer: Self-pay | Admitting: Internal Medicine

## 2016-01-12 ENCOUNTER — Other Ambulatory Visit (INDEPENDENT_AMBULATORY_CARE_PROVIDER_SITE_OTHER): Payer: 59

## 2016-01-12 ENCOUNTER — Ambulatory Visit (INDEPENDENT_AMBULATORY_CARE_PROVIDER_SITE_OTHER): Payer: 59 | Admitting: Internal Medicine

## 2016-01-12 VITALS — BP 122/82 | HR 74 | Temp 98.0°F | Resp 18 | Wt 204.0 lb

## 2016-01-12 DIAGNOSIS — R200 Anesthesia of skin: Secondary | ICD-10-CM

## 2016-01-12 DIAGNOSIS — M3500 Sicca syndrome, unspecified: Secondary | ICD-10-CM

## 2016-01-12 DIAGNOSIS — M2550 Pain in unspecified joint: Secondary | ICD-10-CM | POA: Diagnosis not present

## 2016-01-12 DIAGNOSIS — M797 Fibromyalgia: Secondary | ICD-10-CM

## 2016-01-12 DIAGNOSIS — R5382 Chronic fatigue, unspecified: Secondary | ICD-10-CM | POA: Diagnosis not present

## 2016-01-12 DIAGNOSIS — F32A Depression, unspecified: Secondary | ICD-10-CM

## 2016-01-12 DIAGNOSIS — E892 Postprocedural hypoparathyroidism: Secondary | ICD-10-CM

## 2016-01-12 DIAGNOSIS — E890 Postprocedural hypothyroidism: Secondary | ICD-10-CM

## 2016-01-12 DIAGNOSIS — R202 Paresthesia of skin: Secondary | ICD-10-CM

## 2016-01-12 DIAGNOSIS — G894 Chronic pain syndrome: Secondary | ICD-10-CM

## 2016-01-12 DIAGNOSIS — G4700 Insomnia, unspecified: Secondary | ICD-10-CM | POA: Insufficient documentation

## 2016-01-12 DIAGNOSIS — F329 Major depressive disorder, single episode, unspecified: Secondary | ICD-10-CM | POA: Insufficient documentation

## 2016-01-12 LAB — COMPREHENSIVE METABOLIC PANEL
ALK PHOS: 39 U/L (ref 39–117)
ALT: 13 U/L (ref 0–35)
AST: 17 U/L (ref 0–37)
Albumin: 4.4 g/dL (ref 3.5–5.2)
BUN: 20 mg/dL (ref 6–23)
CO2: 24 mEq/L (ref 19–32)
Calcium: 9 mg/dL (ref 8.4–10.5)
Chloride: 108 mEq/L (ref 96–112)
Creatinine, Ser: 1.52 mg/dL — ABNORMAL HIGH (ref 0.40–1.20)
GFR: 45.8 mL/min — ABNORMAL LOW (ref >60.00)
GLUCOSE: 92 mg/dL (ref 70–99)
POTASSIUM: 4 meq/L (ref 3.5–5.1)
SODIUM: 141 meq/L (ref 135–145)
TOTAL PROTEIN: 7.4 g/dL (ref 6.0–8.3)
Total Bilirubin: 0.3 mg/dL (ref 0.2–1.2)

## 2016-01-12 LAB — CBC WITH DIFFERENTIAL/PLATELET
Basophils Absolute: 0 10*3/uL (ref 0.0–0.1)
Basophils Relative: 0.6 % (ref 0.0–3.0)
EOS PCT: 1.3 % (ref 0.0–5.0)
Eosinophils Absolute: 0.1 10*3/uL (ref 0.0–0.7)
HCT: 29.9 % — ABNORMAL LOW (ref 36.0–46.0)
HEMOGLOBIN: 10.2 g/dL — AB (ref 12.0–15.0)
Lymphocytes Relative: 44.1 % (ref 12.0–46.0)
Lymphs Abs: 2.8 10*3/uL (ref 0.7–4.0)
MCHC: 34.2 g/dL (ref 30.0–36.0)
MCV: 91.6 fl (ref 78.0–100.0)
MONOS PCT: 5.7 % (ref 3.0–12.0)
Monocytes Absolute: 0.4 10*3/uL (ref 0.1–1.0)
NEUTROS PCT: 48.3 % (ref 43.0–77.0)
Neutro Abs: 3 10*3/uL (ref 1.4–7.7)
Platelets: 286 10*3/uL (ref 150.0–400.0)
RBC: 3.27 Mil/uL — AB (ref 3.87–5.11)
RDW: 12.5 % (ref 11.5–15.5)
WBC: 6.3 10*3/uL (ref 4.0–10.5)

## 2016-01-12 LAB — C-REACTIVE PROTEIN: CRP: 0.8 mg/dL (ref 0.5–20.0)

## 2016-01-12 LAB — SEDIMENTATION RATE: Sed Rate: 20 mm/hr (ref 0–30)

## 2016-01-12 LAB — VITAMIN B12: Vitamin B-12: 464 pg/mL (ref 211–911)

## 2016-01-12 LAB — HEMOGLOBIN A1C: Hgb A1c MFr Bld: 5.3 % (ref 4.6–6.5)

## 2016-01-12 NOTE — Assessment & Plan Note (Signed)
?   Related to fibromyalgia, other autoimmune process, osteoarthritis Will check autoimmune blood work Advised her to discuss further with her rheumatologist as well

## 2016-01-12 NOTE — Assessment & Plan Note (Signed)
Primarily related to pain, but also experiencing depression and anxiety May need to consider medication Will check blood work to better understand for some of her symptoms are coming from before making any medication changes

## 2016-01-12 NOTE — Assessment & Plan Note (Addendum)
Management per Dr. Chalmers Cater

## 2016-01-12 NOTE — Progress Notes (Addendum)
Subjective:    Patient ID: Terri Roberts, female    DOB: June 13, 1961, 55 y.o.   MRN: [id number]  HPI She is here for an acute visit.   Rash:  She has an interittment rash all over her body.  She has little clear blisters - they do not itch but are uncomfortable - like a pin sensation.  Initially it was just on her upper abdomen and they went everywhere.  The rash will last hours.  She will take benadryl at night and by morning it goes away.  She gets this 3-4 times a week and it started about two months ago.  No diet changes, no changes in products, no change in medications.  It is not related to heat.   The patient she currently does not have the rash, but thinks she may have one lesion she can gently.   Poor balance, poor perception:  Sometimes she loses her balance and sometimes she has poor perception.  She runs into things because of poor balance.  She has several bruises throughout her body.  She has some involuntary movements of her limb -- right leg or arm.  It occurs randomly.  She has also experiences jolts while in bed.  She has some difficulty turning over.  It is partially related to pain and is just a hard task to do.  Some of these symptoms have been going on for almost a year.  She has had a tremor in her right tumb that she has not noticed recently.    She does not sleep well.  Some of this is related to her pain. Her left knee constantly hurts and it is difficult for her to find a comfortable position.  Whole body pain, fibromyalgia:  She has pain all over.  Her pain is constant.  Her joints and muscle hurt - everything hurts.  She has pain in her knees, ankles, shoulders, hips, elbows. She is taking gabapentin and Cymbalta for her fibromyalgia, but it is not helping.  She is following with rheumatology, primarily for the Sjogren's.  She tries to do some exercise.  The pain limits her much exercise she is able to do.    Medications and allergies reviewed with patient and  updated if appropriate.  Patient Active Problem List   Diagnosis Date Noted  . Paresthesia 06/10/2015  . Neck pain 06/10/2015  . Myalgia and myositis 03/08/2015  . Bilateral calf pain 03/05/2015  . Positive D dimer 03/05/2015  . Fibromyalgia 03/04/2015  . Postprocedural hypothyroidism 03/04/2015  . Postprocedural hypoparathyroidism ([place]) 03/04/2015  . Sjogren's syndrome ([place]) 11/01/2012  . Headache(784.0) 05/21/2010  . THYROID NODULE 03/10/2009  . PALPITATIONS 03/03/2009  . FATIGUE 09/16/2008    Current Outpatient Prescriptions on File Prior to Visit  Medication Sig Dispense Refill  . baclofen (LIORESAL) 10 MG tablet Take 10 mg by mouth every other day. [id number] by Dr.Freeman    . Calcium Citrate-Vitamin D (CALCIUM + D PO) Take by mouth.    . cycloSPORINE (RESTASIS) 0.05 % ophthalmic emulsion Place 1 drop into both eyes 2 (two) times daily.    . DULoxetine (CYMBALTA) 60 MG capsule Take 60 mg by mouth daily.    Marland Kitchen gabapentin (NEURONTIN) 300 MG capsule TAKE 1 CAPSULE (300 MG TOTAL) BY MOUTH 3 (THREE) TIMES DAILY. 90 capsule 0  . hydroxychloroquine (PLAQUENIL) 200 MG tablet Take by mouth 2 (two) times daily.    Marland Kitchen levothyroxine (SYNTHROID, LEVOTHROID) 175 MCG tablet Take 150 mcg by  mouth. 1 by mouth daily EXCEPT none on SUN    . PILOCARPINE HCL PO Take 5 mg by mouth 3 (three) times daily.     Marland Kitchen topiramate (TOPAMAX) 200 MG tablet Take 200 mg by mouth. 2 by mouth 2 x daily    . traMADol (ULTRAM) 50 MG tablet Take 1 tablet (50 mg total) by mouth every 12 (twelve) hours as needed. 60 tablet 1   No current facility-administered medications on file prior to visit.    Past Medical History  Diagnosis Date  . Migraine     Dr Domingo Cocking  . Thyroid nodule   . Sjogren's syndrome ([place])     Dr Ouida Sills  . Peripheral neuropathy ([place])   . Fibromyalgia     Past Surgical History  Procedure Laterality Date  . Mandible surgery      TMJ   . Tubal ligation    . Thyroidectomy  06/2009    benign  nodule  . Intrauterine ablation  2011    Dr Raphael Gibney ( now seeing Dr Christophe Louis)  . Lip biopsy  08/2012    Dr Thea Gist    Social History   Social History  . Marital Status: Widowed    Spouse Name: N/A  . Number of Children: 2  . Years of Education: 14   Occupational History  . Product Compliance Specialist    Social History Main Topics  . Smoking status: Never Smoker   . Smokeless tobacco: Not on file  . Alcohol Use: 1.8 oz/week    3 Shots of liquor per week     Comment:  3 shots of liquor per week  . Drug Use: No  . Sexual Activity: Not on file   Other Topics Concern  . Not on file   Social History Narrative   Lives at home with her daughter.   Right-handed.   One cup caffeine per day.    Family History  Problem Relation Age of Onset  . Stroke Mother 38  . Diabetes Mother   . Stroke Sister 18  . Hypertension Sister   . Heart attack Maternal Grandfather     ? age  . Heart attack Maternal Grandmother     ? age  . Diabetes Sister   . Hypertension Sister   . Diabetes Brother   . Cancer Neg Hx   . AAA (abdominal aortic aneurysm) Father     Review of Systems  Constitutional: Positive for fatigue. Negative for fever and chills.  Respiratory: Negative for cough, shortness of breath and wheezing.   Cardiovascular: Positive for leg swelling. Negative for chest pain and palpitations.  Gastrointestinal: Negative for abdominal pain.       Normal bowel movements  Musculoskeletal: Positive for joint swelling and arthralgias.  Neurological: Positive for tremors and headaches (migraines). Negative for dizziness and light-headedness.       Objective:   Filed Vitals:   01/12/16 1011  BP: 122/82  Pulse: 74  Temp: 98 F (36.7 C)  Resp: 18   Filed Weights   01/12/16 1011  Weight: 204 lb (92.534 kg)   Body mass index is 36.15 kg/(m^2).   Physical Exam  Constitutional: She appears well-developed and well-nourished. No distress.  HENT:  Head: Normocephalic  and atraumatic.  Eyes: Conjunctivae are normal.  Neck: Neck supple. No tracheal deviation present. No thyromegaly present.  Cardiovascular: Normal rate, regular rhythm and normal heart sounds.   No murmur heard. Pulmonary/Chest: Effort normal and breath sounds normal. No respiratory distress.  She has no wheezes. She has no rales.  Musculoskeletal: She exhibits no edema.  Tenderness with palpation in several areas of her body, no obvious joint swelling, no joint deformities  Lymphadenopathy:    She has no cervical adenopathy.  Skin: She is not diaphoretic.  Psychiatric:  Mood and affect is depressed          Assessment & Plan:   Rash: Cause unknown We'll discuss with her rheumatologist May also need to see dermatology  Poor balance, poor perception, jerky involuntary movements Advised that she see neurology again for further evaluation of these symptoms    See Problem List for Assessment and Plan of chronic medical problems.  Follow-up in 2 weeks

## 2016-01-12 NOTE — Assessment & Plan Note (Signed)
Related to her multiple medical problems and poor sleep Check basic blood work to rule out other causes

## 2016-01-12 NOTE — Assessment & Plan Note (Signed)
Following with rheumatology

## 2016-01-12 NOTE — Assessment & Plan Note (Signed)
Check B12 level For neurology for further evaluation

## 2016-01-12 NOTE — Progress Notes (Signed)
Pre visit review using our clinic review tool, if applicable. No additional management support is needed unless otherwise documented below in the visit note. 

## 2016-01-12 NOTE — Patient Instructions (Addendum)
  Test(s) ordered today. Your results will be released to [place] (or called to you) after review, usually within 72hours after test completion. If any changes need to be made, you will be notified at that same time.   Medications reviewed and updated.  No changes recommended at this time.   A referral for behavioral health and neurology  Please followup in 2 weeks

## 2016-01-12 NOTE — Assessment & Plan Note (Signed)
Following with rheumatology Taking gabapentin, Cymbalta and tramadol Stressed the importance of regular exercise, good sleep, keeping any depression or anxiety well-controlled Medications do not seem to be working we may need revised in-encouraged her to talk to rheumatology about this-may need pain management

## 2016-01-12 NOTE — Assessment & Plan Note (Signed)
Has pain all over-seems more than just her autoimmune issues Will check basic blood work Stressed regular exercise

## 2016-01-12 NOTE — Assessment & Plan Note (Signed)
Not well-controlled We'll refer to psychiatry/behavioral health

## 2016-01-12 NOTE — Assessment & Plan Note (Signed)
Management per Dr. Chalmers Cater

## 2016-01-13 LAB — RHEUMATOID FACTOR: Rhuematoid fact SerPl-aCnc: <10 IU/mL (ref <=14)

## 2016-01-14 ENCOUNTER — Encounter: Payer: Self-pay | Admitting: Internal Medicine

## 2016-01-14 LAB — ANA: ANA: NEGATIVE

## 2016-01-26 ENCOUNTER — Encounter: Payer: Self-pay | Admitting: Internal Medicine

## 2016-01-26 ENCOUNTER — Ambulatory Visit: Payer: 59 | Admitting: Internal Medicine

## 2016-01-26 DIAGNOSIS — Z0289 Encounter for other administrative examinations: Secondary | ICD-10-CM

## 2016-03-23 ENCOUNTER — Other Ambulatory Visit: Payer: Self-pay | Admitting: Neurology

## 2016-03-25 ENCOUNTER — Encounter: Payer: 59 | Admitting: Internal Medicine

## 2016-03-25 NOTE — Progress Notes (Signed)
Subjective:    Patient ID: Terri Roberts, female    DOB: [birth year], 55 y.o.   MRN: [id number]  HPI   Error - showed up late   The patient is here for follow up.  Fibromyalgia: She has pain throughout her entire body. Her pain is constant. Her joints and muscles.. She is taking gabapentin and Cymbalta. She follows with rheumatology, but primarily for the Sjogren's.  Medications and allergies reviewed with patient and updated if appropriate.  Patient Active Problem List   Diagnosis Date Noted  . Insomnia 01/12/2016  . Chronic pain syndrome 01/12/2016  . Joint pain 01/12/2016  . Numbness and tingling 01/12/2016  . Chronic fatigue 01/12/2016  . Depression 01/12/2016  . Neck pain 06/10/2015  . Bilateral calf pain 03/05/2015  . Positive D dimer 03/05/2015  . Fibromyalgia 03/04/2015  . Postprocedural hypothyroidism 03/04/2015  . Postprocedural hypoparathyroidism ([place]) 03/04/2015  . Sjogren's syndrome ([place]) 11/01/2012  . Headache(784.0) 05/21/2010  . THYROID NODULE 03/10/2009  . PALPITATIONS 03/03/2009    Current Outpatient Prescriptions on File Prior to Visit  Medication Sig Dispense Refill  . baclofen (LIORESAL) 10 MG tablet Take 10 mg by mouth every other day. [id number] by Dr.Freeman    . Calcium Citrate-Vitamin D (CALCIUM + D PO) Take by mouth.    . cycloSPORINE (RESTASIS) 0.05 % ophthalmic emulsion Place 1 drop into both eyes 2 (two) times daily.    . DULoxetine (CYMBALTA) 60 MG capsule Take 60 mg by mouth daily.    Marland Kitchen gabapentin (NEURONTIN) 300 MG capsule TAKE 1 CAPSULE (300 MG TOTAL) BY MOUTH 3 (THREE) TIMES DAILY. 90 capsule 0  . hydroxychloroquine (PLAQUENIL) 200 MG tablet Take by mouth 2 (two) times daily.    Marland Kitchen levothyroxine (SYNTHROID, LEVOTHROID) 175 MCG tablet Take 150 mcg by mouth. 1 by mouth daily EXCEPT none on SUN    . PILOCARPINE HCL PO Take 5 mg by mouth 3 (three) times daily.     Marland Kitchen topiramate (TOPAMAX) 200 MG tablet Take 200 mg by mouth. 2 by mouth 2 x daily     . traMADol (ULTRAM) 50 MG tablet Take 1 tablet (50 mg total) by mouth every 12 (twelve) hours as needed. 60 tablet 1   No current facility-administered medications on file prior to visit.     Past Medical History:  Diagnosis Date  . Fibromyalgia   . Migraine    Dr Domingo Cocking  . Peripheral neuropathy ([place])   . Sjogren's syndrome ([place])    Dr Ouida Sills  . Thyroid nodule     Past Surgical History:  Procedure Laterality Date  . Intrauterine Ablation  2011   Dr Raphael Gibney ( now seeing Dr Christophe Louis)  . lip biopsy  08/2012   Dr Thea Gist  . MANDIBLE SURGERY     TMJ   . THYROIDECTOMY  06/2009   benign nodule  . TUBAL LIGATION      Social History   Social History  . Marital status: Widowed    Spouse name: N/A  . Number of children: 2  . Years of education: 14   Occupational History  . Product Compliance Specialist    Social History Main Topics  . Smoking status: Never Smoker  . Smokeless tobacco: Not on file  . Alcohol use 1.8 oz/week    3 Shots of liquor per week     Comment:  3 shots of liquor per week  . Drug use: No  . Sexual activity: Not on file  Other Topics Concern  . Not on file   Social History Narrative   Lives at home with her daughter.   Right-handed.   One cup caffeine per day.    Family History  Problem Relation Age of Onset  . Stroke Mother 21  . Diabetes Mother   . Stroke Sister 21  . Hypertension Sister   . Heart attack Maternal Grandfather     ? age  . Heart attack Maternal Grandmother     ? age  . Diabetes Sister   . Hypertension Sister   . Diabetes Brother   . Cancer Neg Hx   . AAA (abdominal aortic aneurysm) Father     Review of Systems     Objective:  There were no vitals filed for this visit. There were no vitals filed for this visit. There is no height or weight on file to calculate BMI.   Physical Exam        Assessment & Plan:    See Problem List for Assessment and Plan of chronic medical problems.    This  encounter was created in error - please disregard.

## 2016-03-30 ENCOUNTER — Encounter: Payer: Self-pay | Admitting: Internal Medicine

## 2016-03-30 ENCOUNTER — Ambulatory Visit (INDEPENDENT_AMBULATORY_CARE_PROVIDER_SITE_OTHER): Payer: 59 | Admitting: Internal Medicine

## 2016-03-30 VITALS — BP 130/76 | HR 78 | Temp 98.1°F | Resp 16 | Ht 64.0 in | Wt 201.0 lb

## 2016-03-30 DIAGNOSIS — M797 Fibromyalgia: Secondary | ICD-10-CM | POA: Diagnosis not present

## 2016-03-30 DIAGNOSIS — E890 Postprocedural hypothyroidism: Secondary | ICD-10-CM | POA: Diagnosis not present

## 2016-03-30 DIAGNOSIS — G4700 Insomnia, unspecified: Secondary | ICD-10-CM

## 2016-03-30 DIAGNOSIS — G894 Chronic pain syndrome: Secondary | ICD-10-CM

## 2016-03-30 DIAGNOSIS — R5382 Chronic fatigue, unspecified: Secondary | ICD-10-CM | POA: Diagnosis not present

## 2016-03-30 MED ORDER — HYDROXYZINE PAMOATE 25 MG PO CAPS
25.0000 mg | ORAL_CAPSULE | Freq: Every day | ORAL | 3 refills | Status: DC
Start: 2016-03-30 — End: 2016-08-05

## 2016-03-30 MED ORDER — GABAPENTIN 300 MG PO CAPS: 600.0000 mg | ORAL_CAPSULE | Freq: Two times a day (BID) | ORAL | 0 refills | Status: DC

## 2016-03-30 NOTE — Assessment & Plan Note (Signed)
I am not prescribing her pain medication Taking tramadol, gabapentin and cymbalta

## 2016-03-30 NOTE — Progress Notes (Signed)
Pre visit review using our clinic review tool, if applicable. No additional management support is needed unless otherwise documented below in the visit note. 

## 2016-03-30 NOTE — Assessment & Plan Note (Signed)
Poor sleep quality - partially from pain and concern for sleep apnea Will refer to pulm for sleep study Start hydroxyzine 25 mg at night - can adjust if needed Discussed that more than one thing contributing to poor sleep and fatigue/joint pain

## 2016-03-30 NOTE — Assessment & Plan Note (Signed)
Restless, poor quality - likely related to pain, will rule out OSA Referred to pulm for sleep study
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
## 2016-03-30 NOTE — Assessment & Plan Note (Signed)
Managed by Dr Chalmers Cater - has been checked recently

## 2016-03-30 NOTE — Patient Instructions (Addendum)
    Medications reviewed and updated.  Changes include trying hydroxyzine for sleep.    Your prescription(s) have been submitted to your pharmacy. Please take as directed and contact our office if you believe you are having problem(s) with the medication(s).  A referral for pulmonary for a sleep study.

## 2016-04-01 ENCOUNTER — Encounter: Payer: Self-pay | Admitting: Pulmonary Disease

## 2016-04-01 ENCOUNTER — Ambulatory Visit (INDEPENDENT_AMBULATORY_CARE_PROVIDER_SITE_OTHER): Payer: 59 | Admitting: Pulmonary Disease

## 2016-04-01 VITALS — BP 130/80 | HR 66 | Ht 64.0 in | Wt 200.0 lb

## 2016-04-01 DIAGNOSIS — G4733 Obstructive sleep apnea (adult) (pediatric): Secondary | ICD-10-CM | POA: Insufficient documentation

## 2016-04-01 DIAGNOSIS — G4710 Hypersomnia, unspecified: Secondary | ICD-10-CM

## 2016-04-01 DIAGNOSIS — Z9989 Dependence on other enabling machines and devices: Secondary | ICD-10-CM

## 2016-04-01 NOTE — Patient Instructions (Signed)
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
## 2016-04-01 NOTE — Assessment & Plan Note (Addendum)
  Patient sleeps at 11:30pm, wakes up at 6:30-7am. She wakes up a lot throughout the night. Very restless at night. Patient has snoring, witnessed apneas, gasping, choking.  Has unrefreshed sleep. Has headaches in am.  She drives 12 miles to work. Struggles to drive. Falls asleep driving.  She sleep talks. (-) other behavior in sleep.  Hypersomnia affects fxnality at work.   She had a lab study 5 yrs ago and that was (-). Done in [place]. Possibly gained weight since last study.   ESS 22.   Plan :  We discussed about the diagnosis of Obstructive Sleep Apnea (OSA) and implications of untreated OSA. We discussed about CPAP and BiPaP as possible treatment options.    We will schedule the patient for a sleep study. Plan for a home sleep study. We will expedite that she is very symptomatic. Falls asleep driving to work. Anticipate no issues with auto CPAP. If sleep study is negative, will need to see her soon at the office. She takes Topamax, Neurontin, Baclofen which make her hypersomnia worse.   Patient was instructed to call the office if he/she has not heard back from the office 1-2 weeks after the sleep study.   Patient was instructed to call the office if he/she is having issues with the PAP device.   We discussed good sleep hygiene.   Patient was advised not to engage in activities requiring concentration and/or vigilance if he/she is sleepy.  Patient was advised not to drive if he/she is sleepy.

## 2016-04-07 ENCOUNTER — Ambulatory Visit: Payer: 59 | Admitting: Internal Medicine

## 2016-04-08 ENCOUNTER — Telehealth: Payer: Self-pay | Admitting: Pulmonary Disease

## 2016-04-08 DIAGNOSIS — G4710 Hypersomnia, unspecified: Secondary | ICD-10-CM

## 2016-04-08 NOTE — Telephone Encounter (Signed)
Terri Roberts : pls tell pt the sleep study was inconclusive as there was not enough data. She only had 1 hr of sleep.  pls order a split night study. Thanks.   Monica Becton, MD 04/08/2016, 4:47 PM [facility] Pager [phone] After 3 pm or if no answer, call [phone]

## 2016-04-13 NOTE — Telephone Encounter (Signed)
Spoke with pt and advised of results and recommendations. Pt agrees to SST. Order placed. Pt aware she will be called to schedule appt. Nothing further needed.

## 2016-04-24 ENCOUNTER — Ambulatory Visit (HOSPITAL_BASED_OUTPATIENT_CLINIC_OR_DEPARTMENT_OTHER): Payer: 59 | Attending: Pulmonary Disease | Admitting: Pulmonary Disease

## 2016-04-24 VITALS — Ht 63.0 in | Wt 194.0 lb

## 2016-04-24 DIAGNOSIS — R0683 Snoring: Secondary | ICD-10-CM | POA: Diagnosis not present

## 2016-04-24 DIAGNOSIS — G4733 Obstructive sleep apnea (adult) (pediatric): Secondary | ICD-10-CM | POA: Diagnosis present

## 2016-04-24 DIAGNOSIS — E669 Obesity, unspecified: Secondary | ICD-10-CM | POA: Insufficient documentation

## 2016-04-24 DIAGNOSIS — F513 Sleepwalking [somnambulism]: Secondary | ICD-10-CM | POA: Insufficient documentation

## 2016-04-24 DIAGNOSIS — R5383 Other fatigue: Secondary | ICD-10-CM | POA: Insufficient documentation

## 2016-04-24 DIAGNOSIS — G4710 Hypersomnia, unspecified: Secondary | ICD-10-CM

## 2016-04-24 DIAGNOSIS — Z6834 Body mass index (BMI) 34.0-34.9, adult: Secondary | ICD-10-CM | POA: Insufficient documentation

## 2016-04-24 DIAGNOSIS — R51 Headache: Secondary | ICD-10-CM | POA: Insufficient documentation

## 2016-04-24 DIAGNOSIS — G4719 Other hypersomnia: Secondary | ICD-10-CM | POA: Diagnosis not present

## 2016-04-30 ENCOUNTER — Telehealth: Payer: Self-pay | Admitting: Pulmonary Disease

## 2016-04-30 DIAGNOSIS — G4710 Hypersomnia, unspecified: Secondary | ICD-10-CM

## 2016-04-30 NOTE — Procedures (Signed)
NAME: MARKESHIA GIEBEL DATE OF BIRTH:  10-15-1960 MEDICAL RECORD NUMBER [id number]  LOCATION: [facility]  PHYSICIAN: [place] OF STUDY: 04/24/2016   Patient Name: Terri Roberts, Terri Roberts Date: 04/24/2016   Gender: Female  D.O.B: 06-04-1961  Age (years): 55  Referring Provider: [place]   Height (inches): 63  Interpreting Physician: [place]   Weight (lbs): 194  RPSGT: Jonna Coup   BMI: 34  MRN: [id number]  Neck Size: 14.00     CLINICAL INFORMATION  Sleep Study Type: NPSG  Indication for sleep study: Excessive Daytime Sleepiness, Fatigue, Morning Headaches, Obesity, Sleep walking/talking/parasomnias, Snoring  Epworth Sleepiness Score: 17   SLEEP STUDY TECHNIQUE  As per the AASM Manual for the Scoring of Sleep and Associated Events v2.3 (April 2016) with a hypopnea requiring 4% desaturations.  The channels recorded and monitored were frontal, central and occipital EEG, electrooculogram (EOG), submentalis EMG (chin), nasal and oral airflow, thoracic and abdominal wall motion, anterior tibialis EMG, snore microphone, electrocardiogram, and pulse oximetry.   MEDICATIONS  Patient's medications include: N/A. Medications reviewed per chart review. Medications self-administered by patient during sleep study : No sleep medicine administered.  SLEEP ARCHITECTURE  The study was initiated at 9:59:13 PM and ended at 4:29:53 AM.  Sleep onset time was 93.4 minutes and the sleep efficiency was 60.3%. The total sleep time was 235.5 minutes.  Stage REM latency was 93.5 minutes.  The patient spent 2.12% of the night in stage N1 sleep, 85.35% in stage N2 sleep, 1.27% in stage N3 and 11.25% in REM.  Alpha intrusion was absent.  Supine sleep was 26.54%.   RESPIRATORY PARAMETERS  The overall apnea/hypopnea index (AHI) was 2.5 per hour. There were 1 total apneas, including 0 obstructive, 1 central and 0 mixed apneas. There  were 9 hypopneas and 3 RERAs.  The AHI during Stage REM sleep was 11.3 per hour. AHI while supine was 0.0 per hour.  The mean oxygen saturation was 94.93%. The minimum SpO2 during sleep was 89.00%.  Soft snoring was noted during this study.  CARDIAC DATA  The 2 lead EKG demonstrated sinus rhythm. The mean heart rate was 73.71 beats per minute. Other EKG findings include: None.   LEG MOVEMENT DATA  The total PLMS were 809 with a resulting PLMS index of 206.11. Associated arousal with leg movement index was 6.1 .  IMPRESSIONS  1. No significant obstructive sleep apnea occurred during this study (AHI = 2.5/h). 2. No significant central sleep apnea occurred during this study (CAI = 0.3/h). 3. The patient had minimal or no oxygen desaturation during the study (Min O2 = 89.00%) 4. The patient snored with Soft snoring volume. 5. No cardiac abnormalities were noted during this study. 6. Severe periodic limb movements of sleep occurred during the study. Associated arousals were significant.  DIAGNOSIS   No evidence for significant OSA based on this study.    RECOMMENDATIONS  1. No evidence for significant OSA based on this study. Patient had a hard time falling asleep and had frequent awakenings. If hypersomnia is persistent, suggest doing a HST to cover more hours. 2. Avoid alcohol, sedatives and other CNS depressants that may worsen sleep apnea and disrupt normal sleep architecture. 3. Sleep hygiene should be reviewed to assess factors that may improve sleep quality. 4. Weight management and regular exercise should be initiated or continued if appropriate. 5. Follow up in the office as  scheduled.  Monica Becton, MD 04/30/2016, 10:56 PM [facility] Pager [phone] After 3 pm or if no answer, call [phone]

## 2016-04-30 NOTE — Telephone Encounter (Signed)
    Please call the pt and tell the pt the LAB sleep study was (-) for OSA.  I think she had issues falling and staying asleep.  Pt stops breathing  2.5  times an hour.   I think the pt is very symptomatic.  She had a home sleep test but it was 1 hr and it was positive.  Do you think, we can do a HST again but tell the pt we need at least 4 hrs data?  Pls let me know what pt says.   Thanks!   J. Shirl Harris, MD 04/30/2016, 10:59 PM

## 2016-05-04 NOTE — Telephone Encounter (Signed)
Msg sent to [facility] to ask if pt's insurance will authorize another HST.

## 2016-05-06 ENCOUNTER — Other Ambulatory Visit: Payer: Self-pay | Admitting: Pulmonary Disease

## 2016-05-06 DIAGNOSIS — G4733 Obstructive sleep apnea (adult) (pediatric): Secondary | ICD-10-CM

## 2016-05-06 NOTE — Telephone Encounter (Signed)
Spoke with patient-states she has not heard anything regarding her sleep study in 2 weeks. I informed patient of her sleep study results and that AD would like patient to repeat HST for at least 4 hours of sleep to get enough data. Pt is willing to repeat HST if Insurance will pay for it. Pt is aware that I am speaking directly with our PCC's to see about payment of a repeat HST.

## 2016-05-26 DIAGNOSIS — G4733 Obstructive sleep apnea (adult) (pediatric): Secondary | ICD-10-CM | POA: Diagnosis not present

## 2016-05-27 ENCOUNTER — Telehealth: Payer: Self-pay | Admitting: Pulmonary Disease

## 2016-05-27 DIAGNOSIS — G4733 Obstructive sleep apnea (adult) (pediatric): Secondary | ICD-10-CM

## 2016-05-27 NOTE — Telephone Encounter (Signed)
  Please call the pt and tell the pt the [place]  showed OSA.   Pt stops breathing  7  times an hour.   Home sleep study was done on : 05/26/16  Please order autoCPAP 5-10 cm H2O. Patient will need a mask fitting session. Patient will need a 1 month download.   Patient needs to be seen by me or any of the NPs/APPs  4-6 weeks after obtaining the cpap machine. Let me know if you receive this.   Thanks!   J. Shirl Harris, MD 05/27/2016, 4:28 PM

## 2016-06-01 NOTE — Telephone Encounter (Signed)
Spoke with pt. She is aware of her results. Pt would like to proceed with CPAP therapy. Order has been placed. Pt will call to make her follow up once she receives that machine. Nothing further was needed at this time.

## 2016-06-07 ENCOUNTER — Telehealth: Payer: Self-pay | Admitting: Pulmonary Disease

## 2016-06-07 NOTE — Telephone Encounter (Signed)
HST given to [facility] from [facility] as she was in office. I have made Corene Cornea aware of this. Nothing further needed.

## 2016-06-09 DIAGNOSIS — G4733 Obstructive sleep apnea (adult) (pediatric): Secondary | ICD-10-CM | POA: Diagnosis not present

## 2016-06-10 ENCOUNTER — Other Ambulatory Visit: Payer: Self-pay | Admitting: *Deleted

## 2016-06-10 DIAGNOSIS — G4733 Obstructive sleep apnea (adult) (pediatric): Secondary | ICD-10-CM

## 2016-08-05 ENCOUNTER — Encounter: Payer: Self-pay | Admitting: Internal Medicine

## 2016-08-05 ENCOUNTER — Ambulatory Visit (INDEPENDENT_AMBULATORY_CARE_PROVIDER_SITE_OTHER): Payer: 59 | Admitting: Internal Medicine

## 2016-08-05 VITALS — BP 122/84 | HR 89 | Temp 98.0°F | Resp 16 | Wt 194.0 lb

## 2016-08-05 DIAGNOSIS — S22081A Stable burst fracture of T11-T12 vertebra, initial encounter for closed fracture: Secondary | ICD-10-CM

## 2016-08-05 DIAGNOSIS — M797 Fibromyalgia: Secondary | ICD-10-CM | POA: Diagnosis not present

## 2016-08-05 DIAGNOSIS — B9789 Other viral agents as the cause of diseases classified elsewhere: Secondary | ICD-10-CM

## 2016-08-05 DIAGNOSIS — J069 Acute upper respiratory infection, unspecified: Secondary | ICD-10-CM | POA: Diagnosis not present

## 2016-08-05 DIAGNOSIS — M2550 Pain in unspecified joint: Secondary | ICD-10-CM

## 2016-08-05 MED ORDER — TRAMADOL HCL 50 MG PO TABS: 100.0000 mg | ORAL_TABLET | Freq: Two times a day (BID) | ORAL | 0 refills | Status: DC | PRN

## 2016-08-05 NOTE — Assessment & Plan Note (Addendum)
Recovering from surgery Doing PT Taking tramadol, naprosyn
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
## 2016-08-05 NOTE — Progress Notes (Signed)
Pre visit review using our clinic review tool, if applicable. No additional management support is needed unless otherwise documented below in the visit note. 

## 2016-08-05 NOTE — Patient Instructions (Addendum)
  Medications reviewed and updated.  No changes recommended at this time.  Your prescription(s) have been submitted to your pharmacy. Please take as directed and contact our office if you believe you are having problem(s) with the medication(s).

## 2016-08-06 DIAGNOSIS — J069 Acute upper respiratory infection, unspecified: Secondary | ICD-10-CM | POA: Insufficient documentation

## 2016-08-06 NOTE — Assessment & Plan Note (Signed)
I will temporarily prescribe her tramadol Continue naproxen, gabapentin, Cymbalta Doing physical therapy for her back-continue increased activity Will follow-up with rheumatology

## 2016-08-06 NOTE — Assessment & Plan Note (Signed)
Present for 2 days Likely viral in nature Symptomatic treatment She will call me next week if her symptoms worsen

## 2016-08-10 ENCOUNTER — Institutional Professional Consult (permissible substitution): Payer: 59 | Admitting: Pulmonary Disease

## 2016-10-07 ENCOUNTER — Ambulatory Visit (INDEPENDENT_AMBULATORY_CARE_PROVIDER_SITE_OTHER): Payer: 59 | Admitting: Internal Medicine

## 2016-10-07 VITALS — BP 132/76 | HR 84 | Temp 97.8°F | Resp 16 | Wt 192.0 lb

## 2016-10-07 DIAGNOSIS — M3500 Sicca syndrome, unspecified: Secondary | ICD-10-CM

## 2016-10-07 DIAGNOSIS — M797 Fibromyalgia: Secondary | ICD-10-CM | POA: Diagnosis not present

## 2016-10-07 DIAGNOSIS — G4761 Periodic limb movement disorder: Secondary | ICD-10-CM

## 2016-10-07 MED ORDER — DULOXETINE HCL 60 MG PO CPEP: 60.0000 mg | ORAL_CAPSULE | Freq: Every day | ORAL | 1 refills | Status: DC

## 2016-10-07 MED ORDER — ONDANSETRON 4 MG PO TBDP: 4.0000 mg | ORAL_TABLET | Freq: Three times a day (TID) | ORAL | 0 refills | Status: DC | PRN
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
## 2016-10-07 NOTE — Progress Notes (Signed)
Pre visit review using our clinic review tool, if applicable. No additional management support is needed unless otherwise documented below in the visit note. 

## 2016-10-07 NOTE — Patient Instructions (Addendum)
   Medications reviewed and updated.  Changes include restarting the cymbalta.  An anti-nausea medication was sent as well.    Your prescription(s) have been submitted to your pharmacy. Please take as directed and contact our office if you believe you are having problem(s) with the medication(s).  A referral was ordered for neurology - Dr Krista Blue.

## 2016-10-09 ENCOUNTER — Encounter: Payer: Self-pay | Admitting: Internal Medicine

## 2016-10-09 DIAGNOSIS — G4761 Periodic limb movement disorder: Secondary | ICD-10-CM | POA: Insufficient documentation

## 2016-10-09 NOTE — Assessment & Plan Note (Signed)
Will be establishing with Dr Amil Amen this month Having withdrawal symptoms from stopping cymbalta - will restart 60 mg daily Taking tramadol (prescribed by surgeon for her back) - will discuss further with Dr Amil Amen

## 2016-10-09 NOTE — Assessment & Plan Note (Signed)
Has been experiencing periodic limb movements for 1-2 years and has a mild tremor in her hands Will refer to neurology for further evaluation

## 2016-10-09 NOTE — Assessment & Plan Note (Signed)
Experiencing increased mucus in throat that is thick Likely related to Sjogren's - has not been on plaquenil recently Will establish with Dr Amil Amen

## 2016-10-14 ENCOUNTER — Telehealth: Payer: Self-pay | Admitting: Internal Medicine

## 2016-10-14 DIAGNOSIS — E041 Nontoxic single thyroid nodule: Secondary | ICD-10-CM

## 2016-10-14 DIAGNOSIS — E892 Postprocedural hypoparathyroidism: Secondary | ICD-10-CM

## 2016-10-14 DIAGNOSIS — E890 Postprocedural hypothyroidism: Secondary | ICD-10-CM

## 2016-10-14 NOTE — Telephone Encounter (Signed)
For her thyroid?

## 2016-10-14 NOTE — Telephone Encounter (Signed)
Pt called request referral to Endocrinology. Please advise.

## 2016-10-17 LAB — CBC AND DIFFERENTIAL
HEMATOCRIT: 26 % — AB (ref 36–46)
Hemoglobin: 8.3 g/dL — AB (ref 12.0–16.0)
NEUTROS ABS: 3 /uL
Platelets: 236 10*3/uL (ref 150–399)
WBC: 6.7 10*3/mL
WBC: 6.7 10^3/mL

## 2016-10-17 LAB — BASIC METABOLIC PANEL
BUN: 41 mg/dL — AB (ref 4–21)
Creatinine: 4.5 mg/dL — AB (ref 0.5–1.1)
GLUCOSE: 104 mg/dL
Potassium: 4.4 mmol/L (ref 3.4–5.3)
Sodium: 141 mmol/L (ref 137–147)

## 2016-10-17 LAB — HEPATIC FUNCTION PANEL
ALT: 12 U/L (ref 7–35)
AST: 16 U/L (ref 13–35)
Alkaline Phosphatase: 51 U/L (ref 25–125)
BILIRUBIN, TOTAL: 0.2 mg/dL

## 2016-10-17 NOTE — Telephone Encounter (Signed)
Referral ordered

## 2016-10-17 NOTE — Telephone Encounter (Signed)
Spoke with pt, states referral is for thyroid.

## 2016-10-20 ENCOUNTER — Ambulatory Visit: Payer: 59 | Admitting: Neurology

## 2016-10-21 ENCOUNTER — Other Ambulatory Visit: Payer: Self-pay | Admitting: Internal Medicine

## 2016-10-21 NOTE — Telephone Encounter (Signed)
Not on current med list. Please advise 

## 2016-10-23 NOTE — Progress Notes (Signed)
Subjective:    Patient ID: Terri Roberts, female    DOB: November 10, 1960, 56 y.o.   MRN: [id number]  HPI The patient is here for follow up.  Sjogren's syndrome, fibromyalgia:  She has established with Dr Amil Amen.  He did blood work last week and her kidney function was decreased.  I told Dr Amil Amen I would follow up.  Back pain:  Was taking mobic.15 mg daily.  She stopped that after having the blood work done last week.  She is drinking plenty of fluids daily.  She denies any urinary symptoms or symptoms suggestive of fluid overload.  Her last renal function was 07/2016 and was normal.   Medications and allergies reviewed with patient and updated if appropriate.  Patient Active Problem List   Diagnosis Date Noted  . Periodic limb movement 10/09/2016  . T12 burst fracture ([place]) 08/05/2016  . Hypersomnia 04/01/2016  . Insomnia 01/12/2016  . Chronic pain syndrome 01/12/2016  . Joint pain 01/12/2016  . Numbness and tingling 01/12/2016  . Chronic fatigue 01/12/2016  . Depression 01/12/2016  . Neck pain 06/10/2015  . Bilateral calf pain 03/05/2015  . Positive D dimer 03/05/2015  . Fibromyalgia 03/04/2015  . Postprocedural hypothyroidism 03/04/2015  . Postprocedural hypoparathyroidism ([place]) 03/04/2015  . Sjogren's syndrome ([place]) 11/01/2012  . Headache(784.0) 05/21/2010  . THYROID NODULE 03/10/2009  . PALPITATIONS 03/03/2009    Current Outpatient Prescriptions on File Prior to Visit  Medication Sig Dispense Refill  . baclofen (LIORESAL) 10 MG tablet Take 10 mg by mouth every other day. [id number]'WF by Dr.Freeman    . calcitRIOL (ROCALTROL) 0.5 MCG capsule Take 1 mcg by mouth 2 (two) times daily.  10  . cycloSPORINE (RESTASIS) 0.05 % ophthalmic emulsion Place 1 drop into both eyes 2 (two) times daily.    . DULoxetine (CYMBALTA) 60 MG capsule Take 1 capsule (60 mg total) by mouth daily. 90 capsule 1  . gabapentin (NEURONTIN) 300 MG capsule Take 2 capsules (600 mg total) by mouth 2 (two)  times daily. 90 capsule 0  . hydroxychloroquine (PLAQUENIL) 200 MG tablet Take by mouth 2 (two) times daily.    . hydrOXYzine (VISTARIL) 25 MG capsule TAKE 1 CAPSULE (25 MG TOTAL) BY MOUTH AT BEDTIME. 30 capsule 3  . ondansetron (ZOFRAN ODT) 4 MG disintegrating tablet Take 1 tablet (4 mg total) by mouth every 8 (eight) hours as needed for nausea or vomiting. 20 tablet 0  . pilocarpine (SALAGEN) 5 MG tablet 1 TABLET THREE TIMES A DAY ORALLY 30 DAYS  3  . SYNTHROID 150 MCG tablet Take 150 mcg by mouth daily.  11  . topiramate (TOPAMAX) 200 MG tablet Take 200 mg by mouth. 2 by mouth 2 x daily    . traMADol (ULTRAM) 50 MG tablet Take 2 tablets (100 mg total) by mouth every 12 (twelve) hours as needed. 120 tablet 0   No current facility-administered medications on file prior to visit.     Past Medical History:  Diagnosis Date  . Fibromyalgia   . Migraine    Dr Domingo Cocking  . Peripheral neuropathy ([place])   . Sjogren's syndrome ([place])    Dr Ouida Sills  . Thyroid nodule     Past Surgical History:  Procedure Laterality Date  . Intrauterine Ablation  2011   Dr Raphael Gibney ( now seeing Dr Christophe Louis)  . lip biopsy  08/2012   Dr Thea Gist  . MANDIBLE SURGERY     TMJ   . THYROIDECTOMY  06/2009  benign nodule  . TUBAL LIGATION      Social History   Social History  . Marital status: Widowed    Spouse name: N/A  . Number of children: 2  . Years of education: 14   Occupational History  . Product Compliance Specialist    Social History Main Topics  . Smoking status: Never Smoker  . Smokeless tobacco: Never Used  . Alcohol use 1.8 oz/week    3 Shots of liquor per week     Comment:  3 shots of liquor per week  . Drug use: No  . Sexual activity: Not Asked   Other Topics Concern  . None   Social History Narrative   Lives at home with her daughter.   Right-handed.   One cup caffeine per day.    Family History  Problem Relation Age of Onset  . Stroke Mother 34  . Diabetes Mother   .  Stroke Sister 76  . Hypertension Sister   . Heart attack Maternal Grandfather     ? age  . Heart attack Maternal Grandmother     ? age  . Diabetes Sister   . Hypertension Sister   . Diabetes Brother   . Cancer Neg Hx   . AAA (abdominal aortic aneurysm) Father     Review of Systems  Constitutional: Negative for chills and fever.  Respiratory: Negative for shortness of breath.   Cardiovascular: Positive for palpitations. Negative for chest pain and leg swelling.  Genitourinary: Negative for difficulty urinating, dysuria and hematuria.  Musculoskeletal: Positive for back pain.  Skin:       Very dry skin  Neurological: Positive for light-headedness. Negative for headaches.       Twitching is worse       Objective:   Vitals:   10/24/16 1619  BP: 140/72  Pulse: 96  Resp: 16  Temp: 98 F (36.7 C)   Wt Readings from Last 3 Encounters:  10/24/16 193 lb (87.5 kg)  10/07/16 192 lb (87.1 kg)  08/05/16 194 lb (88 kg)   Body mass index is 34.19 kg/m.   Physical Exam    Constitutional: Appears well-developed and well-nourished. No distress.  HENT:  Head: Normocephalic and atraumatic.  Neck: Neck supple. No tracheal deviation present. No thyromegaly present.  No cervical lymphadenopathy Cardiovascular: Normal rate, regular rhythm and normal heart sounds.   No murmur heard.  No edema Pulmonary/Chest: Effort normal and breath sounds normal. No respiratory distress. No has no wheezes. No rales.  Skin: Skin is warm and dry. Not diaphoretic.  Psychiatric: Normal mood and affect. Behavior is normal.      Assessment & Plan:    See Problem List for Assessment and Plan of chronic medical problems.

## 2016-10-24 ENCOUNTER — Ambulatory Visit (INDEPENDENT_AMBULATORY_CARE_PROVIDER_SITE_OTHER): Payer: 59 | Admitting: Internal Medicine

## 2016-10-24 ENCOUNTER — Other Ambulatory Visit (INDEPENDENT_AMBULATORY_CARE_PROVIDER_SITE_OTHER): Payer: 59

## 2016-10-24 ENCOUNTER — Encounter: Payer: Self-pay | Admitting: Internal Medicine

## 2016-10-24 ENCOUNTER — Encounter (HOSPITAL_COMMUNITY): Payer: Self-pay | Admitting: Emergency Medicine

## 2016-10-24 ENCOUNTER — Inpatient Hospital Stay (HOSPITAL_COMMUNITY)
Admission: EM | Admit: 2016-10-24 | Discharge: 2016-10-27 | DRG: 684 | Disposition: A | Discharge status: Home / Self Care | Payer: 59 | Attending: Internal Medicine | Admitting: Internal Medicine

## 2016-10-24 VITALS — BP 140/72 | HR 96 | Temp 98.0°F | Resp 16 | Wt 193.0 lb

## 2016-10-24 DIAGNOSIS — N179 Acute kidney failure, unspecified: Secondary | ICD-10-CM | POA: Diagnosis not present

## 2016-10-24 DIAGNOSIS — G4733 Obstructive sleep apnea (adult) (pediatric): Secondary | ICD-10-CM | POA: Diagnosis present

## 2016-10-24 DIAGNOSIS — G629 Polyneuropathy, unspecified: Secondary | ICD-10-CM | POA: Diagnosis present

## 2016-10-24 DIAGNOSIS — D649 Anemia, unspecified: Secondary | ICD-10-CM | POA: Diagnosis not present

## 2016-10-24 DIAGNOSIS — M549 Dorsalgia, unspecified: Secondary | ICD-10-CM | POA: Diagnosis present

## 2016-10-24 DIAGNOSIS — Z841 Family history of disorders of kidney and ureter: Secondary | ICD-10-CM | POA: Diagnosis not present

## 2016-10-24 DIAGNOSIS — R944 Abnormal results of kidney function studies: Secondary | ICD-10-CM | POA: Diagnosis present

## 2016-10-24 DIAGNOSIS — R3129 Other microscopic hematuria: Secondary | ICD-10-CM | POA: Diagnosis not present

## 2016-10-24 DIAGNOSIS — N189 Chronic kidney disease, unspecified: Secondary | ICD-10-CM | POA: Insufficient documentation

## 2016-10-24 DIAGNOSIS — N183 Chronic kidney disease, stage 3 (moderate): Secondary | ICD-10-CM | POA: Diagnosis present

## 2016-10-24 DIAGNOSIS — E890 Postprocedural hypothyroidism: Secondary | ICD-10-CM | POA: Diagnosis present

## 2016-10-24 DIAGNOSIS — R42 Dizziness and giddiness: Secondary | ICD-10-CM | POA: Diagnosis present

## 2016-10-24 DIAGNOSIS — Z791 Long term (current) use of non-steroidal anti-inflammatories (NSAID): Secondary | ICD-10-CM

## 2016-10-24 DIAGNOSIS — M3500 Sicca syndrome, unspecified: Secondary | ICD-10-CM | POA: Diagnosis not present

## 2016-10-24 DIAGNOSIS — R002 Palpitations: Secondary | ICD-10-CM | POA: Diagnosis present

## 2016-10-24 DIAGNOSIS — E892 Postprocedural hypoparathyroidism: Secondary | ICD-10-CM | POA: Diagnosis not present

## 2016-10-24 DIAGNOSIS — Z823 Family history of stroke: Secondary | ICD-10-CM | POA: Diagnosis not present

## 2016-10-24 DIAGNOSIS — D638 Anemia in other chronic diseases classified elsewhere: Secondary | ICD-10-CM | POA: Diagnosis present

## 2016-10-24 DIAGNOSIS — Z833 Family history of diabetes mellitus: Secondary | ICD-10-CM

## 2016-10-24 DIAGNOSIS — Z8249 Family history of ischemic heart disease and other diseases of the circulatory system: Secondary | ICD-10-CM

## 2016-10-24 DIAGNOSIS — Z9989 Dependence on other enabling machines and devices: Secondary | ICD-10-CM

## 2016-10-24 DIAGNOSIS — J449 Chronic obstructive pulmonary disease, unspecified: Secondary | ICD-10-CM | POA: Diagnosis present

## 2016-10-24 DIAGNOSIS — M797 Fibromyalgia: Secondary | ICD-10-CM | POA: Diagnosis present

## 2016-10-24 DIAGNOSIS — G894 Chronic pain syndrome: Secondary | ICD-10-CM | POA: Diagnosis not present

## 2016-10-24 DIAGNOSIS — N289 Disorder of kidney and ureter, unspecified: Secondary | ICD-10-CM

## 2016-10-24 DIAGNOSIS — Z79899 Other long term (current) drug therapy: Secondary | ICD-10-CM | POA: Diagnosis not present

## 2016-10-24 LAB — BASIC METABOLIC PANEL
Anion gap: 9 (ref 5–15)
BUN: 44 mg/dL — AB (ref 6–20)
CO2: 26 mmol/L (ref 22–32)
CREATININE: 4.98 mg/dL — AB (ref 0.44–1.00)
Calcium: 11.8 mg/dL — ABNORMAL HIGH (ref 8.9–10.3)
Chloride: 105 mmol/L (ref 101–111)
GFR calc Af Amer: 10 mL/min — ABNORMAL LOW (ref >60)
GFR, EST NON AFRICAN AMERICAN: 9 mL/min — AB (ref >60)
Glucose, Bld: 107 mg/dL — ABNORMAL HIGH (ref 65–99)
Potassium: 3.4 mmol/L — ABNORMAL LOW (ref 3.5–5.1)
SODIUM: 140 mmol/L (ref 135–145)

## 2016-10-24 LAB — CBC WITH DIFFERENTIAL/PLATELET
Basophils Absolute: 0 10*3/uL (ref 0.0–0.1)
Basophils Relative: 0 %
EOS PCT: 1 %
Eosinophils Absolute: 0.1 10*3/uL (ref 0.0–0.7)
HCT: 24.2 % — ABNORMAL LOW (ref 36.0–46.0)
Hemoglobin: 8.2 g/dL — ABNORMAL LOW (ref 12.0–15.0)
LYMPHS ABS: 3.4 10*3/uL (ref 0.7–4.0)
Lymphocytes Relative: 44 %
MCH: 30.5 pg (ref 26.0–34.0)
MCHC: 33.9 g/dL (ref 30.0–36.0)
MCV: 90 fL (ref 78.0–100.0)
Monocytes Absolute: 0.6 10*3/uL (ref 0.1–1.0)
Monocytes Relative: 8 %
Neutro Abs: 3.5 10*3/uL (ref 1.7–7.7)
Neutrophils Relative %: 47 %
PLATELETS: 243 10*3/uL (ref 150–400)
RBC: 2.69 MIL/uL — AB (ref 3.87–5.11)
RDW: 12.3 % (ref 11.5–15.5)
WBC: 7.6 10*3/uL (ref 4.0–10.5)

## 2016-10-24 LAB — URINALYSIS, ROUTINE W REFLEX MICROSCOPIC
BILIRUBIN URINE: NEGATIVE
Bacteria, UA: NONE SEEN
Glucose, UA: NEGATIVE mg/dL
KETONES UR: NEGATIVE mg/dL
Leukocytes, UA: NEGATIVE
Nitrite: NEGATIVE
Protein, ur: NEGATIVE mg/dL
Specific Gravity, Urine: 1.008 (ref 1.005–1.030)
Squamous Epithelial / LPF: NONE SEEN
pH: 6 (ref 5.0–8.0)

## 2016-10-24 LAB — COMPREHENSIVE METABOLIC PANEL
ALBUMIN: 4.4 g/dL (ref 3.5–5.2)
ALT: 12 U/L (ref 0–35)
AST: 15 U/L (ref 0–37)
Alkaline Phosphatase: 42 U/L (ref 39–117)
BILIRUBIN TOTAL: 0.3 mg/dL (ref 0.2–1.2)
BUN: 44 mg/dL — AB (ref 6–23)
CHLORIDE: 105 meq/L (ref 96–112)
CO2: 28 mEq/L (ref 19–32)
CREATININE: 4.99 mg/dL — AB (ref 0.40–1.20)
Calcium: 11.8 mg/dL — ABNORMAL HIGH (ref 8.4–10.5)
GFR: 11.58 mL/min — CL (ref >60.00)
Glucose, Bld: 101 mg/dL — ABNORMAL HIGH (ref 70–99)
Potassium: 4 mEq/L (ref 3.5–5.1)
SODIUM: 139 meq/L (ref 135–145)
TOTAL PROTEIN: 7.6 g/dL (ref 6.0–8.3)

## 2016-10-24 LAB — POC OCCULT BLOOD, ED: Fecal Occult Bld: NEGATIVE

## 2016-10-24 MED ORDER — SODIUM CHLORIDE 0.9 % IV BOLUS (SEPSIS)
1000.0000 mL | Freq: Once | INTRAVENOUS | Status: AC
  Administered 2016-10-24: 1000 mL via INTRAVENOUS

## 2016-10-24 NOTE — Assessment & Plan Note (Signed)
Blood work done last week Was taking mobic 15 mg daily Decreased GFR likely related to taking Mobic and she has stopped it Recheck cmp today - further evaluation if needed

## 2016-10-24 NOTE — Patient Instructions (Addendum)
  Test(s) ordered today. Your results will be released to MyChart (or called to you) after review, usually within 72hours after test completion. If any changes need to be made, you will be notified at that same time.    Medications reviewed and updated.  No changes recommended at this time.     

## 2016-10-24 NOTE — ED Provider Notes (Signed)
[facility] DEPT Provider Note   CSN: [id number] Arrival date & time: 10/24/16  2110  By signing my name below, I, Reola Mosher, attest that this documentation has been prepared under the direction and in the presence of Montine Circle, PA-C.  Electronically Signed: Reola Mosher, ED Scribe. 10/24/16. 10:12 PM.  History   Chief Complaint Chief Complaint  Patient presents with  . Abnormal Lab   The history is provided by the patient and medical records. No language interpreter was used.    HPI Comments: Terri Roberts is a 56 y.o. female with a PMHx including fibromyalgia, Sjogren's syndrome, chronic back pain, who presents to the Emergency Department with a chief complaint of abnormal lab value which was drawn approximately two weeks ago and rechecked today. Per prior chart review, pt was seen by her PCP on 03/09 for a normal physical and at that time she had labs drawn which showed elevated Creatine and BUN. She was seen by her PCP again this afternoon where these labs were redrawn which had worsened farther. Pt reports that she has felt more fatigued over the past two weeks secondary to this issue. Her family also states that she has been more confused, falling asleep in the middle of conversations, and having increasing speech difficulty as well. Her family also notes that for the past month that she has experienced nausea, vomiting, and diarrhea; however, she has been seen by her PCP for these issues as well which she has been prescribed Reglan and Zofran and taking this at home with some relief. She is additionally scheduled to have f/u w/ a Neurologist in two days for ongoing upper and lower extremity tremors and weakness. No recent new medication changes. She denies fever, dysuria, difficulty urinating, decreased urine output, or any other associated symptoms.   Past Medical History:  Diagnosis Date  . Fibromyalgia   . Migraine    Dr Domingo Cocking  . Peripheral neuropathy  ([place])   . Sjogren's syndrome ([place])    Dr Ouida Sills  . Thyroid nodule    Patient Active Problem List   Diagnosis Date Noted  . Renal insufficiency 10/24/2016  . Periodic limb movement 10/09/2016  . T12 burst fracture ([place]) 08/05/2016  . Hypersomnia 04/01/2016  . Insomnia 01/12/2016  . Chronic pain syndrome 01/12/2016  . Joint pain 01/12/2016  . Numbness and tingling 01/12/2016  . Chronic fatigue 01/12/2016  . Depression 01/12/2016  . Neck pain 06/10/2015  . Bilateral calf pain 03/05/2015  . Positive D dimer 03/05/2015  . Fibromyalgia 03/04/2015  . Postprocedural hypothyroidism 03/04/2015  . Postprocedural hypoparathyroidism ([place]) 03/04/2015  . Sjogren's syndrome ([place]) 11/01/2012  . Headache(784.0) 05/21/2010  . THYROID NODULE 03/10/2009  . PALPITATIONS 03/03/2009   Past Surgical History:  Procedure Laterality Date  . Intrauterine Ablation  2011   Dr Raphael Gibney ( now seeing Dr Christophe Louis)  . lip biopsy  08/2012   Dr Thea Gist  . MANDIBLE SURGERY     TMJ   . THYROIDECTOMY  06/2009   benign nodule  . TUBAL LIGATION     OB History    No data available     Home Medications    Prior to Admission medications   Medication Sig Start Date End Date Taking? Authorizing Provider  Biotin 5000 MCG TABS Take 1 tablet by mouth daily.   Yes Historical Provider, MD  calcitRIOL (ROCALTROL) 0.5 MCG capsule Take 1 mcg by mouth 2 (two) times daily. 03/12/16  Yes Historical Provider, MD  cycloSPORINE (  RESTASIS) 0.05 % ophthalmic emulsion Place 1 drop into both eyes 2 (two) times daily.   Yes Historical Provider, MD  DULoxetine (CYMBALTA) 60 MG capsule Take 1 capsule (60 mg total) by mouth daily. 10/07/16  Yes [name], MD  gabapentin (NEURONTIN) 300 MG capsule Take 2 capsules (600 mg total) by mouth 2 (two) times daily. 03/30/16  Yes [name], MD  hydroxychloroquine (PLAQUENIL) 200 MG tablet Take 400 mg by mouth daily.    Yes Historical Provider, MD  metoCLOPramide (REGLAN) 10 MG tablet  Take 10 mg by mouth every 6 (six) hours as needed for nausea or vomiting.  10/18/16  Yes Historical Provider, MD  ondansetron (ZOFRAN ODT) 4 MG disintegrating tablet Take 1 tablet (4 mg total) by mouth every 8 (eight) hours as needed for nausea or vomiting. 10/07/16  Yes [name], MD  pilocarpine (SALAGEN) 5 MG tablet TAKE 1 TABLET PO TWICE A DAY 03/12/16  Yes Historical Provider, MD  SYNTHROID 150 MCG tablet Take 150 mcg by mouth daily. 03/12/16  Yes Historical Provider, MD  tiZANidine (ZANAFLEX) 4 MG tablet Take 4 mg by mouth every 6 (six) hours as needed for muscle spasms.  10/21/16  Yes Historical Provider, MD  topiramate (TOPAMAX) 200 MG tablet Take 200 mg by mouth at bedtime.    Yes Historical Provider, MD  traMADol (ULTRAM) 50 MG tablet Take 2 tablets (100 mg total) by mouth every 12 (twelve) hours as needed. Patient taking differently: Take 100 mg by mouth every 12 (twelve) hours as needed for moderate pain or severe pain.  08/05/16  Yes [name], MD  hydrOXYzine (VISTARIL) 25 MG capsule TAKE 1 CAPSULE (25 MG TOTAL) BY MOUTH AT BEDTIME. Patient not taking: Reported on 10/24/2016 10/21/16   [name], MD   Family History Family History  Problem Relation Age of Onset  . AAA (abdominal aortic aneurysm) Father   . Stroke Mother 43  . Diabetes Mother   . Stroke Sister 61  . Hypertension Sister   . Heart attack Maternal Grandfather     ? age  . Heart attack Maternal Grandmother     ? age  . Diabetes Sister   . Hypertension Sister   . Diabetes Brother   . Cancer Neg Hx    Social History Social History  Substance Use Topics  . Smoking status: Never Smoker  . Smokeless tobacco: Never Used  . Alcohol use 1.8 oz/week    3 Shots of liquor per week     Comment:  3 shots of liquor per week   Allergies   Patient has no known allergies.  Review of Systems Review of Systems  Constitutional: Positive for fatigue. Negative for fever.  Genitourinary: Negative for decreased urine  volume, difficulty urinating and dysuria.  Neurological: Positive for speech difficulty.  Psychiatric/Behavioral: Positive for confusion.   Physical Exam Updated Vital Signs BP (!) 147/83 (BP Location: Left Arm)   Pulse 82   Temp 98.2 F (36.8 C) (Oral)   Resp 16   Ht 5\' 3"  (1.6 m)   Wt 193 lb (87.5 kg)   SpO2 100%   BMI 34.19 kg/m   Physical Exam  Constitutional: She is oriented to person, place, and time. She appears well-developed and well-nourished. No distress.  HENT:  Head: Normocephalic and atraumatic.  Eyes: Conjunctivae and EOM are normal.  Neck: Normal range of motion.  Cardiovascular: Normal rate, regular rhythm and normal heart sounds.   No murmur heard.  Pulmonary/Chest: Effort normal and breath sounds normal. No respiratory distress. She has no wheezes. She has no rales.  Abdominal: Soft. She exhibits no distension. There is no tenderness.  Musculoskeletal: Normal range of motion.  Neurological: She is alert and oriented to person, place, and time.  Skin: Skin is warm and dry. No pallor.  Psychiatric: She has a normal mood and affect. Her behavior is normal. Judgment normal.  Nursing note and vitals reviewed.  ED Treatments / Results  DIAGNOSTIC STUDIES: Oxygen Saturation is 100% on RA, normal by my interpretation.   COORDINATION OF CARE: 10:11 PM-Discussed next steps with pt. Pt verbalized understanding and is agreeable with the plan.   Labs (all labs ordered are listed, but only abnormal results are displayed) Labs Reviewed  CBC WITH DIFFERENTIAL/PLATELET - Abnormal; Notable for the following:       Result Value   RBC 2.69 (*)    Hemoglobin 8.2 (*)    HCT 24.2 (*)    All other components within normal limits  BASIC METABOLIC PANEL - Abnormal; Notable for the following:    Potassium 3.4 (*)    Glucose, Bld 107 (*)    BUN 44 (*)    Creatinine, Ser 4.98 (*)    Calcium 11.8 (*)    GFR calc non Af Amer 9 (*)    GFR calc Af Amer 10 (*)    All other  components within normal limits  URINALYSIS, ROUTINE W REFLEX MICROSCOPIC - Abnormal; Notable for the following:    Color, Urine STRAW (*)    Hgb urine dipstick LARGE (*)    All other components within normal limits  POC OCCULT BLOOD, ED   EKG  EKG Interpretation None      Radiology No results found.  Procedures Procedures   Medications Ordered in ED Medications - No data to display  Initial Impression / Assessment and Plan / ED Course  I have reviewed the triage vital signs and the nursing notes.  Pertinent labs & imaging results that were available during my care of the patient were reviewed by me and considered in my medical decision making (see chart for details).     Patient sent to ED by PCP for elevated creatinine and BUN. She states that her rheumatologist noticed that her creatinine and BUN were elevated without a week ago, and sent her to her primary care doctor for follow-up. Primary care retested her blood work and found that her creatinine and BUN had further elevated. Baseline is unclear.  Primary care called patient and instructed her to come to the emergency department for treatment. She still makes urine. She states that she feels fatigued. Reports associated nausea, vomiting, diarrhea. There are no other associated symptoms.   Final Clinical Impressions(s) / ED Diagnoses   Final diagnoses:  AKI (acute kidney injury) ([place])   New Prescriptions New Prescriptions   No medications on file   I personally performed the services described in this documentation, which was scribed in my presence. The recorded information has been reviewed and is accurate.       Montine Circle, PA-C 10/24/16 [place], MD 11/02/16 1230

## 2016-10-24 NOTE — ED Triage Notes (Addendum)
Pt was sent from PCP due to creatine of 4.99 and elevated BUN. Pt had routine check up last week and was informed of results. States she has chronic back pain and is unsure how to distinguish that from flank pain.   Pt also adds low hemoglobin.
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
## 2016-10-24 NOTE — Progress Notes (Signed)
Pre visit review using our clinic review tool, if applicable. No additional management support is needed unless otherwise documented below in the visit note. 

## 2016-10-25 ENCOUNTER — Inpatient Hospital Stay (HOSPITAL_COMMUNITY): Payer: 59

## 2016-10-25 LAB — CREATININE, URINE, RANDOM: Creatinine, Urine: 57.64 mg/dL

## 2016-10-25 LAB — BASIC METABOLIC PANEL
Anion gap: 7 (ref 5–15)
BUN: 45 mg/dL — ABNORMAL HIGH (ref 6–20)
CHLORIDE: 111 mmol/L (ref 101–111)
CO2: 23 mmol/L (ref 22–32)
Calcium: 10.6 mg/dL — ABNORMAL HIGH (ref 8.9–10.3)
Creatinine, Ser: 4.68 mg/dL — ABNORMAL HIGH (ref 0.44–1.00)
GFR, EST AFRICAN AMERICAN: 11 mL/min — AB (ref >60)
GFR, EST NON AFRICAN AMERICAN: 10 mL/min — AB (ref >60)
Glucose, Bld: 113 mg/dL — ABNORMAL HIGH (ref 65–99)
POTASSIUM: 3.4 mmol/L — AB (ref 3.5–5.1)
SODIUM: 141 mmol/L (ref 135–145)

## 2016-10-25 LAB — CBC
HEMATOCRIT: 21.2 % — AB (ref 36.0–46.0)
Hemoglobin: 7.3 g/dL — ABNORMAL LOW (ref 12.0–15.0)
MCH: 30.9 pg (ref 26.0–34.0)
MCHC: 34.4 g/dL (ref 30.0–36.0)
MCV: 89.8 fL (ref 78.0–100.0)
Platelets: 207 10*3/uL (ref 150–400)
RBC: 2.36 MIL/uL — AB (ref 3.87–5.11)
RDW: 12.5 % (ref 11.5–15.5)
WBC: 7.1 10*3/uL (ref 4.0–10.5)

## 2016-10-25 LAB — PROTEIN / CREATININE RATIO, URINE
Creatinine, Urine: 58.73 mg/dL
PROTEIN CREATININE RATIO: 0.39 mg/mg{creat} — AB (ref 0.00–0.15)
Total Protein, Urine: 23 mg/dL

## 2016-10-25 LAB — SODIUM, URINE, RANDOM: Sodium, Ur: 50 mmol/L

## 2016-10-25 MED ORDER — PILOCARPINE HCL 5 MG PO TABS
5.0000 mg | ORAL_TABLET | Freq: Two times a day (BID) | ORAL | Status: DC
  Administered 2016-10-25 – 2016-10-27 (×6): 5 mg via ORAL
  Filled 2016-10-25 (×6): qty 1

## 2016-10-25 MED ORDER — ONDANSETRON HCL 4 MG/2ML IJ SOLN
4.0000 mg | Freq: Four times a day (QID) | INTRAMUSCULAR | Status: DC | PRN
  Administered 2016-10-26: 4 mg via INTRAVENOUS
  Filled 2016-10-25: qty 2

## 2016-10-25 MED ORDER — GABAPENTIN 100 MG PO CAPS
200.0000 mg | ORAL_CAPSULE | Freq: Two times a day (BID) | ORAL | Status: DC
  Administered 2016-10-25 – 2016-10-27 (×6): 200 mg via ORAL
  Filled 2016-10-25 (×6): qty 2

## 2016-10-25 MED ORDER — ONDANSETRON HCL 4 MG PO TABS: 4.0000 mg | ORAL_TABLET | Freq: Four times a day (QID) | ORAL | Status: DC | PRN

## 2016-10-25 MED ORDER — POTASSIUM CHLORIDE CRYS ER 20 MEQ PO TBCR
40.0000 meq | EXTENDED_RELEASE_TABLET | ORAL | Status: AC
  Administered 2016-10-25 (×2): 40 meq via ORAL
  Filled 2016-10-25 (×2): qty 2

## 2016-10-25 MED ORDER — CALCITRIOL 0.5 MCG PO CAPS
1.0000 ug | ORAL_CAPSULE | Freq: Two times a day (BID) | ORAL | Status: DC
  Administered 2016-10-25 (×3): 1 ug via ORAL
  Filled 2016-10-25 (×4): qty 2

## 2016-10-25 MED ORDER — HYDROXYCHLOROQUINE SULFATE 200 MG PO TABS
400.0000 mg | ORAL_TABLET | Freq: Every day | ORAL | Status: DC
  Administered 2016-10-25 – 2016-10-27 (×3): 400 mg via ORAL
  Filled 2016-10-25 (×3): qty 2

## 2016-10-25 MED ORDER — SODIUM CHLORIDE 0.9 % IV SOLN
INTRAVENOUS | Status: DC
  Administered 2016-10-25 – 2016-10-26 (×3): via INTRAVENOUS

## 2016-10-25 MED ORDER — TIZANIDINE HCL 4 MG PO TABS
2.0000 mg | ORAL_TABLET | Freq: Four times a day (QID) | ORAL | Status: DC | PRN
  Administered 2016-10-25 – 2016-10-27 (×2): 2 mg via ORAL
  Filled 2016-10-25 (×3): qty 1

## 2016-10-25 MED ORDER — TRAMADOL HCL 50 MG PO TABS
100.0000 mg | ORAL_TABLET | Freq: Two times a day (BID) | ORAL | Status: DC | PRN
  Administered 2016-10-25 (×2): 100 mg via ORAL
  Filled 2016-10-25 (×2): qty 2

## 2016-10-25 MED ORDER — ACETAMINOPHEN 325 MG PO TABS: 650.0000 mg | ORAL_TABLET | Freq: Four times a day (QID) | ORAL | Status: DC | PRN

## 2016-10-25 MED ORDER — TOPIRAMATE 100 MG PO TABS
100.0000 mg | ORAL_TABLET | Freq: Every day | ORAL | Status: DC
  Administered 2016-10-25 – 2016-10-26 (×3): 100 mg via ORAL
  Filled 2016-10-25 (×3): qty 1

## 2016-10-25 MED ORDER — ACETAMINOPHEN 650 MG RE SUPP: 650.0000 mg | Freq: Four times a day (QID) | RECTAL | Status: DC | PRN

## 2016-10-25 MED ORDER — ENOXAPARIN SODIUM 30 MG/0.3ML ~~LOC~~ SOLN
30.0000 mg | Freq: Every day | SUBCUTANEOUS | Status: DC
  Administered 2016-10-25 – 2016-10-26 (×3): 30 mg via SUBCUTANEOUS
  Filled 2016-10-25 (×3): qty 0.3

## 2016-10-25 MED ORDER — LEVOTHYROXINE SODIUM 75 MCG PO TABS
150.0000 ug | ORAL_TABLET | Freq: Every day | ORAL | Status: DC
  Administered 2016-10-25 – 2016-10-27 (×3): 150 ug via ORAL
  Filled 2016-10-25 (×2): qty 1
  Filled 2016-10-25: qty 2

## 2016-10-25 NOTE — Progress Notes (Signed)
Nutrition Brief Note  Patient identified on the Malnutrition Screening Tool (MST) Report  Patient reports feeling hungry today and getting ready to order lunch. Pt states that despite having N/V PTA she was still able to eat 2 meals a day as her nausea was primarily in the morning time.  Pt's weight is stable at this time. UBW states is 199 lb.   Wt Readings from Last 15 Encounters:  10/24/16 195 lb 1.7 oz (88.5 kg)  10/24/16 193 lb (87.5 kg)  10/07/16 192 lb (87.1 kg)  08/05/16 194 lb (88 kg)  04/24/16 194 lb (88 kg)  04/01/16 200 lb (90.7 kg)  03/30/16 201 lb (91.2 kg)  01/12/16 204 lb (92.5 kg)  07/06/15 210 lb (95.3 kg)  06/10/15 210 lb (95.3 kg)  03/04/15 207 lb (93.9 kg)  05/06/14 192 lb 2 oz (87.1 kg)  08/07/13 191 lb (86.6 kg)  11/01/12 197 lb (89.4 kg)  08/02/12 199 lb 6.4 oz (90.4 kg)    Body mass index is 34.29 kg/m. Patient meets criteria for obesity based on current BMI.   Current diet order is renal with fluid restriction.  Labs and medications reviewed.   No nutrition interventions warranted at this time. If nutrition issues arise, please consult RD.   Clayton Bibles, MS, RD, LDN Pager: [phone] After Hours Pager: [phone]
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
## 2016-10-25 NOTE — Progress Notes (Signed)
Pt refused CPAP qhs.  Pt stated that she was unable to tolerate our nasal mask last night and wished to sleep without it tonight.  Pt encouraged to contact RT if she changes her mind.

## 2016-10-26 ENCOUNTER — Ambulatory Visit: Payer: 59 | Admitting: Neurology

## 2016-10-26 DIAGNOSIS — R002 Palpitations: Secondary | ICD-10-CM

## 2016-10-26 LAB — BASIC METABOLIC PANEL
ANION GAP: 3 — AB (ref 5–15)
BUN: 37 mg/dL — ABNORMAL HIGH (ref 6–20)
CALCIUM: 11.1 mg/dL — AB (ref 8.9–10.3)
CO2: 22 mmol/L (ref 22–32)
Chloride: 116 mmol/L — ABNORMAL HIGH (ref 101–111)
Creatinine, Ser: 4.21 mg/dL — ABNORMAL HIGH (ref 0.44–1.00)
GFR, EST AFRICAN AMERICAN: 13 mL/min — AB (ref >60)
GFR, EST NON AFRICAN AMERICAN: 11 mL/min — AB (ref >60)
Glucose, Bld: 91 mg/dL (ref 65–99)
Potassium: 4.1 mmol/L (ref 3.5–5.1)
Sodium: 141 mmol/L (ref 135–145)

## 2016-10-26 LAB — RETICULOCYTES
RBC.: 2.47 MIL/uL — ABNORMAL LOW (ref 3.87–5.11)
Retic Count, Absolute: 14.8 10*3/uL — ABNORMAL LOW (ref 19.0–186.0)
Retic Ct Pct: 0.6 % (ref 0.4–3.1)

## 2016-10-26 LAB — CBC WITH DIFFERENTIAL/PLATELET
BASOS ABS: 0 10*3/uL (ref 0.0–0.1)
BASOS PCT: 0 %
Eosinophils Absolute: 0.1 10*3/uL (ref 0.0–0.7)
Eosinophils Relative: 1 %
HEMATOCRIT: 22.1 % — AB (ref 36.0–46.0)
HEMOGLOBIN: 7.6 g/dL — AB (ref 12.0–15.0)
Lymphocytes Relative: 59 %
Lymphs Abs: 4.4 10*3/uL — ABNORMAL HIGH (ref 0.7–4.0)
MCH: 30.8 pg (ref 26.0–34.0)
MCHC: 34.4 g/dL (ref 30.0–36.0)
MCV: 89.5 fL (ref 78.0–100.0)
Monocytes Absolute: 0.4 10*3/uL (ref 0.1–1.0)
Monocytes Relative: 5 %
NEUTROS ABS: 2.7 10*3/uL (ref 1.7–7.7)
NEUTROS PCT: 35 %
Platelets: 220 10*3/uL (ref 150–400)
RBC: 2.47 MIL/uL — ABNORMAL LOW (ref 3.87–5.11)
RDW: 12.3 % (ref 11.5–15.5)
WBC: 7.6 10*3/uL (ref 4.0–10.5)

## 2016-10-26 LAB — HCV COMMENT:

## 2016-10-26 LAB — HIV ANTIBODY (ROUTINE TESTING W REFLEX): HIV Screen 4th Generation wRfx: NONREACTIVE

## 2016-10-26 LAB — VITAMIN B12: Vitamin B-12: 346 pg/mL (ref 180–914)

## 2016-10-26 LAB — FERRITIN: Ferritin: 185 ng/mL (ref 11–307)

## 2016-10-26 LAB — IRON AND TIBC
IRON: 66 ug/dL (ref 28–170)
SATURATION RATIOS: 25 % (ref 10.4–31.8)
TIBC: 260 ug/dL (ref 250–450)
UIBC: 194 ug/dL

## 2016-10-26 LAB — FOLATE: FOLATE: 6.3 ng/mL (ref >5.9)

## 2016-10-26 LAB — HEPATITIS C ANTIBODY (REFLEX)

## 2016-10-26 LAB — PROTEIN / CREATININE RATIO, URINE
Creatinine, Urine: 30.89 mg/dL
PROTEIN CREATININE RATIO: 0.29 mg/mg{creat} — AB (ref 0.00–0.15)
TOTAL PROTEIN, URINE: 9 mg/dL

## 2016-10-26 MED ORDER — HYDROCODONE-ACETAMINOPHEN 5-325 MG PO TABS
1.0000 | ORAL_TABLET | Freq: Four times a day (QID) | ORAL | Status: DC | PRN
  Administered 2016-10-27: 1 via ORAL
  Filled 2016-10-26: qty 1

## 2016-10-26 MED ORDER — CALCITRIOL 0.5 MCG PO CAPS
1.0000 ug | ORAL_CAPSULE | Freq: Every day | ORAL | Status: DC
  Administered 2016-10-27: 1 ug via ORAL
  Filled 2016-10-26: qty 2

## 2016-10-26 MED ORDER — HYDROCODONE-ACETAMINOPHEN 5-325 MG PO TABS
1.0000 | ORAL_TABLET | Freq: Once | ORAL | Status: AC
Start: 2016-10-26 — End: 2016-10-26
  Administered 2016-10-26: 1 via ORAL
  Filled 2016-10-26: qty 1

## 2016-10-26 NOTE — Progress Notes (Signed)
Assessment:  1 Nonoliguric, AKI prob hemodynamically mediated(GI losses), exacerbated by NSAID and CKD 2 CKD prob Sjogren's kidney (but lack of acidosis unusual) 3 Anemia ? Acute v chronic 4 Hypercalcemia (Hx of hypoparathyroidism) on D  Plan: Supportive, reduce calcitriol  Subjective: Interval History: Good UOP  Objective: Vital signs in last 24 hours: Temp:  [98.4 F (36.9 C)] 98.4 F (36.9 C) (03/28 0536) Pulse Rate:  [72-85] 77 (03/28 0536) Resp:  [16-18] 16 (03/28 0536) BP: (130-142)/(64-80) 130/64 (03/28 0536) SpO2:  [98 %-100 %] 98 % (03/28 0536) Weight change:   Intake/Output from previous day: 03/27 0701 - 03/28 0700 In: 600 [P.O.:600] Out: 3300 [Urine:3300] Intake/Output this shift: No intake/output data recorded.  General appearance: alert and cooperative Resp: clear to auscultation bilaterally Chest wall: no tenderness Cardio: regular rate and rhythm, S1, S2 normal, no murmur, click, rub or gallop GI: epigastric tenderness Extremities: extremities normal, atraumatic, no cyanosis or edema  Lab Results:  Recent Labs  10/25/16 0347 10/26/16 0408  WBC 7.1 7.6  HGB 7.3* 7.6*  HCT 21.2* 22.1*  PLT 207 220   BMET:  Recent Labs  10/25/16 0347 10/26/16 0408  NA 141 141  K 3.4* 4.1  CL 111 116*  CO2 23 22  GLUCOSE 113* 91  BUN 45* 37*  CREATININE 4.68* 4.21*  CALCIUM 10.6* 11.1*   No results for input(s): PTH in the last 72 hours. Iron Studies: No results for input(s): IRON, TIBC, TRANSFERRIN, FERRITIN in the last 72 hours. Studies/Results: US Renal  Result Date: 10/25/2016 CLINICAL DATA:  Acute renal failure. EXAM: RENAL / URINARY TRACT ULTRASOUND COMPLETE COMPARISON:  Lumbar spine MRI from 07/13/2015 FINDINGS: Right Kidney: Length: 11.1 cm. Diffusely hyperechoic, significantly more so than the adjacent liver. No mass or hydronephrosis visualized. Left Kidney: Length: 10.8 cm. Diffusely hyperechoic. No atrophy. No mass or hydronephrosis  visualized. Bladder: Appears normal for degree of bladder distention. IMPRESSION: 1. Diffusely hyperechoic kidneys, not overtly atrophic. No hydronephrosis. Chronic medical renal disease could have this appearance, as can not a variety of other conditions such as diabetic nephropathy and HIV nephropathy. Electronically Signed   By: Van Clines M.D.   On: 10/25/2016 09:58    Scheduled: . calcitRIOL  1 mcg Oral BID  . enoxaparin (LOVENOX) injection  30 mg Subcutaneous QHS  . gabapentin  200 mg Oral BID  . hydroxychloroquine  400 mg Oral Daily  . levothyroxine  150 mcg Oral QAC breakfast  . pilocarpine  5 mg Oral BID  . topiramate  100 mg Oral QHS    LOS: 2 days   Saqib Cazarez C 10/26/2016,8:56 AM
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
## 2016-10-27 ENCOUNTER — Encounter: Payer: Self-pay | Admitting: Neurology

## 2016-10-27 LAB — URINALYSIS, ROUTINE W REFLEX MICROSCOPIC
BILIRUBIN URINE: NEGATIVE
Glucose, UA: NEGATIVE mg/dL
HGB URINE DIPSTICK: NEGATIVE
Ketones, ur: NEGATIVE mg/dL
Leukocytes, UA: NEGATIVE
Nitrite: NEGATIVE
Protein, ur: NEGATIVE mg/dL
SPECIFIC GRAVITY, URINE: 1.005 (ref 1.005–1.030)
pH: 6 (ref 5.0–8.0)

## 2016-10-27 LAB — BASIC METABOLIC PANEL
Anion gap: 8 (ref 5–15)
BUN: 35 mg/dL — AB (ref 6–20)
CALCIUM: 10.4 mg/dL — AB (ref 8.9–10.3)
CO2: 21 mmol/L — AB (ref 22–32)
CREATININE: 3.98 mg/dL — AB (ref 0.44–1.00)
Chloride: 114 mmol/L — ABNORMAL HIGH (ref 101–111)
GFR calc Af Amer: 14 mL/min — ABNORMAL LOW (ref >60)
GFR calc non Af Amer: 12 mL/min — ABNORMAL LOW (ref >60)
GLUCOSE: 86 mg/dL (ref 65–99)
Potassium: 4 mmol/L (ref 3.5–5.1)
Sodium: 143 mmol/L (ref 135–145)

## 2016-10-27 LAB — MAGNESIUM: Magnesium: 1.6 mg/dL — ABNORMAL LOW (ref 1.7–2.4)

## 2016-10-27 NOTE — Progress Notes (Signed)
Patient will call when ready to go on CPAP

## 2016-10-27 NOTE — Care Management Note (Signed)
Case Management Note  Patient Details  Name: Terri Roberts MRN: [id number] Date of Birth: [birth year]  Subjective/Objective:  56 y/o f admitted w/ARF. From home.                  Action/Plan:d/c home.   Expected Discharge Date:  10/27/16               Expected Discharge Plan:  Home/Self Care  In-House Referral:     Discharge planning Services  CM Consult  Post Acute Care Choice:    Choice offered to:     DME Arranged:    DME Agency:     HH Arranged:    HH Agency:     Status of Service:  Completed, signed off  If discussed at [employer] of Stay Meetings, dates discussed:    Additional Comments:  Dessa Phi, RN 10/27/2016, 12:16 PM
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
## 2016-10-27 NOTE — Progress Notes (Signed)
Assessment:  1 Nonoliguric, AKI prob hemodynamically mediated(GI losses), exacerbated by NSAID and CKD 2 CKD prob Sjogren's kidney (but lack of acidosis unusual) 3 Anemia ? Acute v chronic 4 Hypercalcemia (Hx of hypoparathyroidism) on D 5 Microhematuria  Plan: 1) Repeat UA 2) OK for discharge from renal perspective. 3) Labs in a week and office visit with me in a month  Subjective: Interval History: Feels OK, except swollen lips c/w her Sjogren's.  HIV and Hep C neg  Objective: Vital signs in last 24 hours: Temp:  [98 F (36.7 C)-99.3 F (37.4 C)] 98 F (36.7 C) (03/29 0512) Pulse Rate:  [60-85] 60 (03/29 0512) Resp:  [18-19] 19 (03/29 0512) BP: (140-161)/(69-85) 148/77 (03/29 0512) SpO2:  [95 %-100 %] 100 % (03/29 0512) Weight change:   Intake/Output from previous day: 03/28 0701 - 03/29 0700 In: 5420 [P.O.:480; I.V.:4940] Out: 700 [Urine:700] Intake/Output this shift: No intake/output data recorded.  General appearance: alert and cooperative Resp: clear to auscultation bilaterally Cardio: regular rate and rhythm, S1, S2 normal, no murmur, click, rub or gallop  Lab Results:  Recent Labs  10/25/16 0347 10/26/16 0408  WBC 7.1 7.6  HGB 7.3* 7.6*  HCT 21.2* 22.1*  PLT 207 220   BMET:  Recent Labs  10/26/16 0408 10/27/16 0500  NA 141 143  K 4.1 4.0  CL 116* 114*  CO2 22 21*  GLUCOSE 91 86  BUN 37* 35*  CREATININE 4.21* 3.98*  CALCIUM 11.1* 10.4*   No results for input(s): PTH in the last 72 hours. Iron Studies:  Recent Labs  10/26/16 0408  IRON 66  TIBC 260  FERRITIN 185   Studies/Results: US Renal  Result Date: 10/25/2016 CLINICAL DATA:  Acute renal failure. EXAM: RENAL / URINARY TRACT ULTRASOUND COMPLETE COMPARISON:  Lumbar spine MRI from 07/13/2015 FINDINGS: Right Kidney: Length: 11.1 cm. Diffusely hyperechoic, significantly more so than the adjacent liver. No mass or hydronephrosis visualized. Left Kidney: Length: 10.8 cm. Diffusely  hyperechoic. No atrophy. No mass or hydronephrosis visualized. Bladder: Appears normal for degree of bladder distention. IMPRESSION: 1. Diffusely hyperechoic kidneys, not overtly atrophic. No hydronephrosis. Chronic medical renal disease could have this appearance, as can not a variety of other conditions such as diabetic nephropathy and HIV nephropathy. Electronically Signed   By: Van Clines M.D.   On: 10/25/2016 09:58   Scheduled: . calcitRIOL  1 mcg Oral Daily  . enoxaparin (LOVENOX) injection  30 mg Subcutaneous QHS  . gabapentin  200 mg Oral BID  . hydroxychloroquine  400 mg Oral Daily  . levothyroxine  150 mcg Oral QAC breakfast  . pilocarpine  5 mg Oral BID  . topiramate  100 mg Oral QHS     LOS: 3 days   Jeremiah Tarpley C 10/27/2016,8:36 AM

## 2016-11-10 ENCOUNTER — Encounter: Payer: Self-pay | Admitting: Internal Medicine

## 2016-11-10 NOTE — Progress Notes (Signed)
Result abstracted 

## 2016-11-14 ENCOUNTER — Encounter: Payer: Self-pay | Admitting: Endocrinology

## 2016-11-23 ENCOUNTER — Telehealth: Payer: 59 | Admitting: Family

## 2016-11-23 DIAGNOSIS — R112 Nausea with vomiting, unspecified: Secondary | ICD-10-CM

## 2016-11-23 MED ORDER — ONDANSETRON HCL 4 MG PO TABS: 4.0000 mg | ORAL_TABLET | Freq: Three times a day (TID) | ORAL | 0 refills | Status: DC | PRN

## 2016-11-23 NOTE — Progress Notes (Signed)
We are sorry that you are not feeling well. Here is how we plan to help!  Based on what you have shared with me it looks like you have a Virus that is irritating your GI tract.  Vomiting is the forceful emptying of a portion of the stomach's content through the mouth.  Although nausea and vomiting can make you feel miserable, it's important to remember that these are not diseases, but rather symptoms of an underlying illness.  When we treat short term symptoms, we always caution that any symptoms that persist should be fully evaluated in a medical office.  I have prescribed a medication that will help alleviate your symptoms and allow you to stay hydrated:  Zofran 4 mg 1 tablet every 8 hours as needed for nausea and vomiting   I am sorry, but can not call in your Synthroid. I would recommend you call the Endocrinologists and tell them you have an appt and ask if they can send in enough until your appt or you could also call your primary care provider and ask.   HOME CARE:  Drink clear liquids.  This is very important! Dehydration (the lack of fluid) can lead to a serious complication.  Start off with 1 tablespoon every 5 minutes for 8 hours.  You may begin eating bland foods after 8 hours without vomiting.  Start with saltine crackers, white bread, rice, mashed potatoes, applesauce.  After 48 hours on a bland diet, you may resume a normal diet.  Try to go to sleep.  Sleep often empties the stomach and relieves the need to vomit.  GET HELP RIGHT AWAY IF:   Your symptoms do not improve or worsen within 2 days after treatment.  You have a fever for over 3 days.  You cannot keep down fluids after trying the medication.  MAKE SURE YOU:   Understand these instructions.  Will watch your condition.  Will get help right away if you are not doing well or get worse.   Thank you for choosing an e-visit. Your e-visit answers were reviewed by a board certified advanced clinical practitioner  to complete your personal care plan. Depending upon the condition, your plan could have included both over the counter or prescription medications. Please review your pharmacy choice. Be sure that the pharmacy you have chosen is open so that you can pick up your prescription now.  If there is a problem you may message your provider in [place] to have the prescription routed to another pharmacy. Your safety is important to [place]. If you have drug allergies check your prescription carefully.  For the next 24 hours, you can use MyChart to ask questions about today's visit, request a non-urgent call back, or ask for a work or school excuse from your e-visit provider. You will get an e-mail in the next two days asking about your experience. I hope that your e-visit has been valuable and will speed your recovery.

## 2016-11-24 ENCOUNTER — Telehealth: Payer: Self-pay | Admitting: Internal Medicine

## 2016-11-24 MED ORDER — SYNTHROID 150 MCG PO TABS: 150.0000 ug | ORAL_TABLET | Freq: Every day | ORAL | 1 refills | Status: DC

## 2016-11-24 NOTE — Telephone Encounter (Signed)
Pt would like a refill SYNTHROID 150 MCG tablet  She has an appt  5/30 to see the endocrinologists for the first time.  [facility] [address] [employer]

## 2016-11-24 NOTE — Telephone Encounter (Signed)
Please advise. Not on current med list 

## 2016-11-24 NOTE — Telephone Encounter (Signed)
On med list as synthroid - sent to [facility].

## 2016-11-30 ENCOUNTER — Ambulatory Visit: Payer: 59 | Admitting: Neurology

## 2016-12-01 ENCOUNTER — Encounter: Payer: Self-pay | Admitting: Internal Medicine

## 2016-12-01 ENCOUNTER — Ambulatory Visit (INDEPENDENT_AMBULATORY_CARE_PROVIDER_SITE_OTHER): Payer: 59 | Admitting: Internal Medicine

## 2016-12-01 ENCOUNTER — Other Ambulatory Visit (INDEPENDENT_AMBULATORY_CARE_PROVIDER_SITE_OTHER): Payer: 59

## 2016-12-01 VITALS — BP 122/80 | HR 99 | Temp 98.5°F | Resp 16 | Wt 192.0 lb

## 2016-12-01 DIAGNOSIS — N179 Acute kidney failure, unspecified: Secondary | ICD-10-CM

## 2016-12-01 DIAGNOSIS — E892 Postprocedural hypoparathyroidism: Secondary | ICD-10-CM | POA: Diagnosis not present

## 2016-12-01 DIAGNOSIS — N189 Chronic kidney disease, unspecified: Secondary | ICD-10-CM

## 2016-12-01 DIAGNOSIS — D649 Anemia, unspecified: Secondary | ICD-10-CM | POA: Diagnosis not present

## 2016-12-01 DIAGNOSIS — E890 Postprocedural hypothyroidism: Secondary | ICD-10-CM

## 2016-12-01 LAB — CBC WITH DIFFERENTIAL/PLATELET
BASOS ABS: 0.1 10*3/uL (ref 0.0–0.1)
BASOS PCT: 0.7 % (ref 0.0–3.0)
EOS ABS: 0.1 10*3/uL (ref 0.0–0.7)
Eosinophils Relative: 1 % (ref 0.0–5.0)
HEMATOCRIT: 24.7 % — AB (ref 36.0–46.0)
LYMPHS PCT: 41.9 % (ref 12.0–46.0)
Lymphs Abs: 3 10*3/uL (ref 0.7–4.0)
MCHC: 34.6 g/dL (ref 30.0–36.0)
MCV: 90.9 fl (ref 78.0–100.0)
Monocytes Absolute: 0.4 10*3/uL (ref 0.1–1.0)
Monocytes Relative: 5.3 % (ref 3.0–12.0)
Neutro Abs: 3.7 10*3/uL (ref 1.4–7.7)
Neutrophils Relative %: 51.1 % (ref 43.0–77.0)
Platelets: 320 10*3/uL (ref 150.0–400.0)
RBC: 2.72 Mil/uL — AB (ref 3.87–5.11)
RDW: 12.5 % (ref 11.5–15.5)
WBC: 7.2 10*3/uL (ref 4.0–10.5)

## 2016-12-01 LAB — COMPREHENSIVE METABOLIC PANEL
ALBUMIN: 4.2 g/dL (ref 3.5–5.2)
ALK PHOS: 51 U/L (ref 39–117)
ALT: 13 U/L (ref 0–35)
AST: 17 U/L (ref 0–37)
BUN: 29 mg/dL — ABNORMAL HIGH (ref 6–23)
CALCIUM: 9.2 mg/dL (ref 8.4–10.5)
CO2: 30 mEq/L (ref 19–32)
CREATININE: 2.13 mg/dL — AB (ref 0.40–1.20)
Chloride: 104 mEq/L (ref 96–112)
GFR: 30.93 mL/min — ABNORMAL LOW (ref >60.00)
Glucose, Bld: 121 mg/dL — ABNORMAL HIGH (ref 70–99)
Potassium: 4.3 mEq/L (ref 3.5–5.1)
Sodium: 140 mEq/L (ref 135–145)
Total Bilirubin: 0.3 mg/dL (ref 0.2–1.2)
Total Protein: 7.4 g/dL (ref 6.0–8.3)

## 2016-12-01 LAB — T4, FREE: FREE T4: 1.11 ng/dL (ref 0.60–1.60)

## 2016-12-01 LAB — MAGNESIUM: Magnesium: 1.9 mg/dL (ref 1.5–2.5)

## 2016-12-01 LAB — VITAMIN D 25 HYDROXY (VIT D DEFICIENCY, FRACTURES): VITD: 16.68 ng/mL — AB (ref 30.00–100.00)

## 2016-12-01 LAB — TSH: TSH: 0.15 u[IU]/mL — AB (ref 0.35–4.50)

## 2016-12-01 NOTE — Assessment & Plan Note (Signed)
cmp today Management per endo/nephrology

## 2016-12-01 NOTE — Assessment & Plan Note (Signed)
Secondary to meloxicam Improved in hospital with IVF, but Cr still high Check cmp

## 2016-12-01 NOTE — Patient Instructions (Addendum)
  Test(s) ordered today. Your results will be released to MyChart (or called to you) after review, usually within 72hours after test completion. If any changes need to be made, you will be notified at that same time.  Medications reviewed and updated.  No changes recommended at this time.    Please followup in 6 months   

## 2016-12-01 NOTE — Assessment & Plan Note (Signed)
Was seeing Dr Chalmers Cater - will start seeing Dr Dwyane Dee - appt scheduled Will check tsh today

## 2016-12-01 NOTE — Assessment & Plan Note (Signed)
Following with nephrology Recent AKI on CKD due to meloxicam Recheck cmp today Has f/u with nephrology 5/30
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
## 2016-12-01 NOTE — Progress Notes (Signed)
Pre visit review using our clinic review tool, if applicable. No additional management support is needed unless otherwise documented below in the visit note. 

## 2016-12-01 NOTE — Assessment & Plan Note (Signed)
cbc

## 2016-12-02 ENCOUNTER — Other Ambulatory Visit: Payer: Self-pay | Admitting: Emergency Medicine

## 2016-12-02 MED ORDER — LEVOTHYROXINE SODIUM 137 MCG PO TABS: 137.0000 ug | ORAL_TABLET | Freq: Every day | ORAL | 1 refills | Status: DC

## 2016-12-27 NOTE — Progress Notes (Deleted)
Patient ID: Terri Roberts, female   DOB: 10-26-1960, 56 y.o.   MRN: [id number]            Referring Physician:  Reason for Appointment:  Hypothyroidism, new visit    History of Present Illness:   Hypothyroidism was first diagnosed in   At the time of diagnosis patient was having symptoms of  fatigue, cold sensitivity, difficulty concentrating, dry skin, weight gain and hair loss .           The patient has been treated with    With starting thyroid supplementation the patient's symptoms         Patient's weight history is as follows:  Wt Readings from Last 3 Encounters:  12/01/16 192 lb (87.1 kg)  10/24/16 195 lb 1.7 oz (88.5 kg)  10/24/16 193 lb (87.5 kg)    Thyroid function results have been as follows:  Lab Results  Component Value Date   TSH 0.15 (L) 12/01/2016   TSH 13.63 (H) 05/21/2010   TSH 0.69 05/14/2009   TSH 0.48 03/03/2009   FREET4 1.11 12/01/2016   FREET4 0.7 05/14/2009   FREET4 0.7 03/03/2009   FREET4 0.7 11/18/2008   T3FREE 2.4 05/14/2009   T3FREE 2.4 03/03/2009   T3FREE 2.9 11/18/2008     Past Medical History:  Diagnosis Date  . Fibromyalgia   . Migraine    Dr Domingo Cocking  . Peripheral neuropathy   . Sjogren's syndrome ([place])    Dr Ouida Sills  . Thyroid nodule     Past Surgical History:  Procedure Laterality Date  . Intrauterine Ablation  2011   Dr Raphael Gibney ( now seeing Dr Christophe Louis)  . lip biopsy  08/2012   Dr Thea Gist  . MANDIBLE SURGERY     TMJ   . THYROIDECTOMY  06/2009   benign nodule  . TUBAL LIGATION      Family History  Problem Relation Age of Onset  . AAA (abdominal aortic aneurysm) Father   . Stroke Mother 71  . Diabetes Mother   . Kidney failure Mother   . Stroke Sister 56  . Hypertension Sister   . Heart attack Maternal Grandfather        ? age  . Heart attack Maternal Grandmother        ? age  . Diabetes Sister   . Hypertension Sister   . Diabetes Brother   . Kidney failure Brother   . Cancer Neg Hx      Social History:  reports that she has never smoked. She has never used smokeless tobacco. She reports that she drinks about 1.8 oz of alcohol per week . She reports that she does not use drugs.  Allergies: No Known Allergies  Allergies as of 12/28/2016   No Known Allergies     Medication List       Accurate as of 12/27/16  9:40 PM. Always use your most recent med list.          Biotin 5000 MCG Tabs Take 1 tablet by mouth daily.   calcitRIOL 0.5 MCG capsule Commonly known as:  ROCALTROL Take 1 mcg by mouth 2 (two) times daily.   cycloSPORINE 0.05 % ophthalmic emulsion Commonly known as:  RESTASIS Place 1 drop into both eyes 2 (two) times daily.   DULoxetine 60 MG capsule Commonly known as:  CYMBALTA Take 1 capsule (60 mg total) by mouth daily.   gabapentin 300 MG capsule Commonly known as:  NEURONTIN Take 2 capsules (600  mg total) by mouth 2 (two) times daily.   hydroxychloroquine 200 MG tablet Commonly known as:  PLAQUENIL Take 400 mg by mouth daily.   levothyroxine 137 MCG tablet Commonly known as:  SYNTHROID Take 1 tablet (137 mcg total) by mouth daily before breakfast.   metoCLOPramide 10 MG tablet Commonly known as:  REGLAN Take 10 mg by mouth every 6 (six) hours as needed for nausea or vomiting.   ondansetron 4 MG disintegrating tablet Commonly known as:  ZOFRAN ODT Take 1 tablet (4 mg total) by mouth every 8 (eight) hours as needed for nausea or vomiting.   ondansetron 4 MG tablet Commonly known as:  ZOFRAN Take 1 tablet (4 mg total) by mouth every 8 (eight) hours as needed for nausea or vomiting.   pilocarpine 5 MG tablet Commonly known as:  SALAGEN TAKE 1 TABLET PO TWICE A DAY   tiZANidine 4 MG tablet Commonly known as:  ZANAFLEX Take 4 mg by mouth every 6 (six) hours as needed for muscle spasms.   TOPAMAX 200 MG tablet Generic drug:  topiramate Take 200 mg by mouth at bedtime.   traMADol 50 MG tablet Commonly known as:  ULTRAM Take 2  tablets (100 mg total) by mouth every 12 (twelve) hours as needed.          Review of Systems              Examination:    There were no vitals taken for this visit.  GENERAL:  Average build.   No pallor, clubbing, lymphadenopathy or edema.  Skin:  no rash or pigmentation.  EYES:  No prominence of the eyes or swelling of the eyelids  ENT: Oral mucosa and tongue normal.  THYROID:  Not palpable.  HEART:  Normal  S1 and S2; no murmur or click.  CHEST:    Lungs: Vescicular breath sounds heard equally.  No crepitations/ wheeze.  ABDOMEN:  No distention.  Liver and spleen not palpable.  No other mass or tenderness.  NEUROLOGICAL: Reflexes are bilaterally at biceps.  JOINTS:  Normal.   Assessment:  HYPOTHYROIDISM  PLAN:    Follow-up   Zonya Gudger 12/27/2016, 9:40 PM   Consultation note copy sent to the PCP  Note: This office note was prepared with Dragon voice recognition system technology. Any transcriptional errors that result from this process are unintentional.

## 2016-12-28 ENCOUNTER — Ambulatory Visit: Payer: 59 | Admitting: Endocrinology

## 2017-01-17 ENCOUNTER — Encounter: Payer: Self-pay | Admitting: Endocrinology

## 2017-01-17 ENCOUNTER — Ambulatory Visit (INDEPENDENT_AMBULATORY_CARE_PROVIDER_SITE_OTHER): Payer: 59 | Admitting: Endocrinology

## 2017-01-17 VITALS — BP 128/82 | HR 75 | Ht 63.5 in | Wt 195.0 lb

## 2017-01-17 DIAGNOSIS — E890 Postprocedural hypothyroidism: Secondary | ICD-10-CM

## 2017-01-17 DIAGNOSIS — E892 Postprocedural hypoparathyroidism: Secondary | ICD-10-CM | POA: Diagnosis not present

## 2017-01-17 DIAGNOSIS — R5383 Other fatigue: Secondary | ICD-10-CM

## 2017-01-17 DIAGNOSIS — N189 Chronic kidney disease, unspecified: Secondary | ICD-10-CM | POA: Diagnosis not present

## 2017-01-17 DIAGNOSIS — E559 Vitamin D deficiency, unspecified: Secondary | ICD-10-CM | POA: Diagnosis not present

## 2017-01-17 LAB — RENAL FUNCTION PANEL
Albumin: 4.3 g/dL (ref 3.5–5.2)
BUN: 30 mg/dL — ABNORMAL HIGH (ref 6–23)
CO2: 24 mEq/L (ref 19–32)
CREATININE: 1.8 mg/dL — AB (ref 0.40–1.20)
Calcium: 9 mg/dL (ref 8.4–10.5)
Chloride: 108 mEq/L (ref 96–112)
GFR: 37.54 mL/min — AB (ref >60.00)
GLUCOSE: 87 mg/dL (ref 70–99)
PHOSPHORUS: 4.2 mg/dL (ref 2.3–4.6)
Potassium: 4 mEq/L (ref 3.5–5.1)
SODIUM: 139 meq/L (ref 135–145)

## 2017-01-17 LAB — TSH: TSH: 0.21 u[IU]/mL — AB (ref 0.35–4.50)

## 2017-01-17 LAB — T4, FREE: FREE T4: 1.75 ng/dL — AB (ref 0.60–1.60)

## 2017-01-17 NOTE — Patient Instructions (Signed)
Vitamin D3, 2000 units daily 

## 2017-01-17 NOTE — Progress Notes (Signed)
Patient ID: Terri Roberts, female   DOB: 07-Mar-1961, 56 y.o.   MRN: [id number]             Reason for Appointment:  New thyroid consultation    History of Present Illness:   The patient's thyroid enlargement was first discovered in 2010 She apparently was found to have a large left-sided thyroid nodule as described below Apparently she was recommended thyroidectomy based on her needle aspiration biopsy Subsequently has been on thyroid supplementation  She had been on 175 g of levothyroxine and followed by an endocrinologist Subsequently her dose was reduced to 150 g and most recently was seen by her PCP and because of her low TSH in 5/15 her dose was reduced down to 137 g She does not think she feels any different with these changes in dosage She thinks she is feeling mild fatigue but also has had issues with insomnia and her fatigue is not new or worse At times will feel hot No shakiness or palpitations Over the last several months she has lost about 10 pounds and now her weight is stable   Lab Results  Component Value Date   TSH 0.15 (L) 12/01/2016   TSH 13.63 (H) 05/21/2010   TSH 0.69 05/14/2009   FREET4 1.11 12/01/2016   FREET4 0.7 05/14/2009   FREET4 0.7 03/03/2009    Prior evaluation: She  had an ultrasound exam in 2010 showing the following: The dominant nodule is in the inferior aspect of the left lobe and measures 4.0 x 2.4 x 2.1 cm with some calcifications. Multiple nodules are noted throughout the right lobe with the largest in the inferior right lobe measuring 1.4 x 1.0 x 1.2 cm and appearing solid.   Thyroid biopsy Reported on 04/16/09 showed the following   LEFT THYROID NODULE, FINE NEEDLE ASPIRATION, THIN PREP There is colloid and blood with follicular epithelium which is in sheets and small aggregates. The features favor an adenomatous or hyperplastic follicular lesion.    PROBLEM 2: HYPOCALCEMIA  The patient thinks that she started having muscle  cramps in her hands and feet and was found to have hypocalcemia in 2013 but no details are available She was told that this may have been related to her thyroid surgery and was started on calcitriol Previously had been taking 4 capsules of calcitriol 0.5 g per day Has not been  taking any calcium supplements  She had renal failure in March and at that time her calcium started going above normal Her calcitriol dose has been reduced to 2 capsules a day since 10/28/16 Most recent calcium was normal and she only has complained of some nocturnal leg cramps in her calves but not in her hands and feet  She had a low vitamin D of about 16 and this has not been supplemented  Lab Results  Component Value Date   CALCIUM 9.2 12/01/2016   CALCIUM 10.4 (H) 10/27/2016   CALCIUM 11.1 (H) 10/26/2016   CALCIUM 10.6 (H) 10/25/2016   CALCIUM 11.8 (H) 10/24/2016   CALCIUM 11.8 (H) 10/24/2016        Allergies as of 01/17/2017   No Known Allergies     Medication List       Accurate as of 01/17/17 10:24 AM. Always use your most recent med list.          baclofen 10 MG tablet Commonly known as:  LIORESAL Take 10 mg by mouth as needed for muscle spasms.   Biotin 5000 MCG  Tabs Take 1 tablet by mouth daily.   calcitRIOL 0.5 MCG capsule Commonly known as:  ROCALTROL Take 1 mcg by mouth 2 (two) times daily.   cycloSPORINE 0.05 % ophthalmic emulsion Commonly known as:  RESTASIS Place 1 drop into both eyes 2 (two) times daily.   DULoxetine 60 MG capsule Commonly known as:  CYMBALTA Take 1 capsule (60 mg total) by mouth daily.   gabapentin 300 MG capsule Commonly known as:  NEURONTIN Take 2 capsules (600 mg total) by mouth 2 (two) times daily.   hydroxychloroquine 200 MG tablet Commonly known as:  PLAQUENIL Take 400 mg by mouth daily.   levothyroxine 137 MCG tablet Commonly known as:  SYNTHROID Take 1 tablet (137 mcg total) by mouth daily before breakfast.   metoCLOPramide 10 MG  tablet Commonly known as:  REGLAN Take 10 mg by mouth every 6 (six) hours as needed for nausea or vomiting.   ondansetron 4 MG tablet Commonly known as:  ZOFRAN Take 1 tablet (4 mg total) by mouth every 8 (eight) hours as needed for nausea or vomiting.   pilocarpine 5 MG tablet Commonly known as:  SALAGEN TAKE 1 TABLET PO TWICE A DAY   tiZANidine 4 MG tablet Commonly known as:  ZANAFLEX Take 4 mg by mouth every 6 (six) hours as needed for muscle spasms.   TOPAMAX 200 MG tablet Generic drug:  topiramate Take 200 mg by mouth at bedtime.   traMADol 50 MG tablet Commonly known as:  ULTRAM Take 2 tablets (100 mg total) by mouth every 12 (twelve) hours as needed.       Allergies: No Known Allergies  Past Medical History:  Diagnosis Date  . Fibromyalgia   . Migraine    Dr Domingo Cocking  . Peripheral neuropathy   . Sjogren's syndrome ([place])    Dr Ouida Sills  . Thyroid nodule     Past Surgical History:  Procedure Laterality Date  . Intrauterine Ablation  2011   Dr Raphael Gibney ( now seeing Dr Christophe Louis)  . lip biopsy  08/2012   Dr Thea Gist  . MANDIBLE SURGERY     TMJ   . THYROIDECTOMY  06/2009   benign nodule  . TUBAL LIGATION      Family History  Problem Relation Age of Onset  . AAA (abdominal aortic aneurysm) Father   . Stroke Mother 73  . Diabetes Mother   . Kidney failure Mother   . Stroke Sister 65  . Hypertension Sister   . Heart attack Maternal Grandfather        ? age  . Heart attack Maternal Grandmother        ? age  . Diabetes Sister   . Hypertension Sister   . Diabetes Brother   . Kidney failure Brother   . Cancer Neg Hx     Social History:  reports that she has never smoked. She has never used smokeless tobacco. She reports that she drinks about 1.8 oz of alcohol per week . She reports that she does not use drugs.     Review of Systems  Constitutional: Positive for malaise.  HENT:       Occasional headaches  Eyes:       Has had dry eyes    Respiratory: Negative for shortness of breath.   Cardiovascular: Negative for leg swelling.  Gastrointestinal: Negative for abdominal pain.  Endocrine: Negative for fatigue.  Musculoskeletal: Positive for muscle aches and muscle cramps.  Skin: Negative for dry skin.  Hair loss for several years, takes biotin with some improvement May tend to bruise easily  Neurological: Negative for numbness and tingling.  Psychiatric/Behavioral: Positive for insomnia.      Examination:   BP 128/82   Pulse 75   Ht 5' 3.5" (1.613 m)   Wt 195 lb (88.5 kg)   SpO2 96%   BMI 34.00 kg/m    General Appearance: pleasant, has mild generalized obesity          Eyes: No abnormal prominence, conjunctivae normal ENT: Tongue normal         Neck: The thyroid is nonpalpable  There is no lymphadenopathy.     Cardiovascular: Normal  heart sounds, no murmur Respiratory:  Lungs clear Abdomen: No hepatosplenomegaly Or tenderness Neurological: REFLEXES: at biceps are normal.  She was take signed appears negative  Skin: no rash, has minimal small ecchymoses on forearms         Assessment/Plan:   HYPOTHYROIDISM, postsurgical She has required somewhat less levothyroxine for supplementation this year and now taking 137 g for about 6 weeks Last TSH 0.15 She is subjectively doing well with no symptoms of hypothyroid is more hyperthyroidism  HYPOPARATHYROIDISM, reportedly resulting from her thyroidectomy Requiring less calcitriol because of her renal failure this year Currently asymptomatic, has tendency to chronic mild leg cramps Objectively no signs of hypocalcemia Does need to have repeat calcium level checked today  VITAMIN D deficiency: Since she does have significant vitamin D deficiency and her renal function is only moderately impaired she will need to have supplementation with at least 2000 units daily  Follow-up to be decided based on labs today   [facility] 01/17/2017

## 2017-01-18 NOTE — Addendum Note (Signed)
Addended by: Elayne Snare on: 01/18/2017 07:55 AM   Modules accepted: Level of Service

## 2017-01-19 ENCOUNTER — Other Ambulatory Visit: Payer: Self-pay

## 2017-01-19 MED ORDER — LEVOTHYROXINE SODIUM 125 MCG PO TABS: 125.0000 ug | ORAL_TABLET | Freq: Every day | ORAL | 3 refills | Status: DC

## 2017-01-24 ENCOUNTER — Telehealth: Payer: Self-pay | Admitting: Endocrinology

## 2017-01-24 ENCOUNTER — Encounter: Payer: Self-pay | Admitting: Endocrinology

## 2017-01-24 NOTE — Telephone Encounter (Signed)
-----   Message from Elayne Snare, MD sent at 01/21/2017  3:05 PM EDT ----- Regarding: appt change non urgent Will need to move up her appointment to August instead of October with labs to recheck thyroid

## 2017-01-24 NOTE — Telephone Encounter (Signed)
Attempted to call pt to get her appt moved to August but no answer and no vm pick up  Mailed letter

## 2017-03-15 ENCOUNTER — Other Ambulatory Visit (INDEPENDENT_AMBULATORY_CARE_PROVIDER_SITE_OTHER): Payer: 59

## 2017-03-15 DIAGNOSIS — E890 Postprocedural hypothyroidism: Secondary | ICD-10-CM | POA: Diagnosis not present

## 2017-03-15 DIAGNOSIS — N189 Chronic kidney disease, unspecified: Secondary | ICD-10-CM | POA: Diagnosis not present

## 2017-03-15 LAB — BASIC METABOLIC PANEL
BUN: 31 mg/dL — ABNORMAL HIGH (ref 6–23)
CO2: 26 mEq/L (ref 19–32)
CREATININE: 2.12 mg/dL — AB (ref 0.40–1.20)
Calcium: 8.6 mg/dL (ref 8.4–10.5)
Chloride: 108 mEq/L (ref 96–112)
GFR: 31.06 mL/min — ABNORMAL LOW (ref >60.00)
Glucose, Bld: 94 mg/dL (ref 70–99)
Potassium: 4.1 mEq/L (ref 3.5–5.1)
Sodium: 140 mEq/L (ref 135–145)

## 2017-03-15 LAB — T4, FREE: FREE T4: 1.18 ng/dL (ref 0.60–1.60)

## 2017-03-15 LAB — TSH: TSH: 2.54 u[IU]/mL (ref 0.35–4.50)

## 2017-03-20 ENCOUNTER — Ambulatory Visit (INDEPENDENT_AMBULATORY_CARE_PROVIDER_SITE_OTHER): Payer: 59 | Admitting: Endocrinology

## 2017-03-20 ENCOUNTER — Encounter: Payer: Self-pay | Admitting: Endocrinology

## 2017-03-20 VITALS — BP 128/88 | HR 69 | Ht 63.5 in | Wt 200.0 lb

## 2017-03-20 DIAGNOSIS — E892 Postprocedural hypoparathyroidism: Secondary | ICD-10-CM

## 2017-03-20 DIAGNOSIS — E890 Postprocedural hypothyroidism: Secondary | ICD-10-CM

## 2017-03-20 NOTE — Progress Notes (Signed)
Patient ID: Terri Roberts, female   DOB: 1961/04/02, 56 y.o.   MRN: [id number]             Reason for Appointment:  thyroid and calcium follow-up    History of Present Illness:   The patient's thyroid enlargement was first discovered in 2010 and was recommended thyroidectomy based on her needle aspiration biopsy Subsequently has been on thyroid supplementation  She had been on 175 g of levothyroxine and followed by an endocrinologist Subsequently her dose was reduced progressively and on her last visit she was taking 137 g Because of her TSH being consistently low this was further reduced down to 125 g  She now says that she is starting to feel more fatigue but this is mostly in the last 2 weeks She also has significant difficulty sleeping at night and having back pain and generalized pains She has difficulty getting up in the morning and feels sluggish and sleepy She is concerned about her weight gain also  She takes her thyroid supplement before breakfast by itself daily She does take biotin, this has been a long-term supplement  TSH is back to normal  Wt Readings from Last 3 Encounters:  03/20/17 200 lb (90.7 kg)  01/17/17 195 lb (88.5 kg)  12/01/16 192 lb (87.1 kg)    Lab Results  Component Value Date   TSH 2.54 03/15/2017   TSH 0.21 (L) 01/17/2017   TSH 0.15 (L) 12/01/2016   FREET4 1.18 03/15/2017   FREET4 1.75 (H) 01/17/2017   FREET4 1.11 12/01/2016    Prior evaluation: She  had an ultrasound exam in 2010 showing the following: The dominant nodule is in the inferior aspect of the left lobe and measures 4.0 x 2.4 x 2.1 cm with some calcifications. Multiple nodules are noted throughout the right lobe with the largest in the inferior right lobe measuring 1.4 x 1.0 x 1.2 cm and appearing solid.   Thyroid biopsy Reported on 04/16/09 showed the following   LEFT THYROID NODULE, FINE NEEDLE ASPIRATION, THIN PREP There is colloid and blood with follicular  epithelium which is in sheets and small aggregates. The features favor an adenomatous or hyperplastic follicular lesion.    PROBLEM 2: HYPOCALCEMIA  The patient started having muscle cramps in her hands and feet and was found to have hypocalcemia in 2013 but no details are available She was told that this may have been related to her thyroid surgery and was started on calcitriol Has not been  taking any calcium supplements  Her calcitriol dose has been reduced to 2 capsules a day since 10/28/16 when her calcium was high Most recent calcium has been consistently normal No muscle cramps recently  She had a low vitamin D of about 16 and she has started taking 2000 units of vitamin D3 as directed    Lab Results  Component Value Date   CALCIUM 8.6 03/15/2017   CALCIUM 9.0 01/17/2017   CALCIUM 9.2 12/01/2016   CALCIUM 10.4 (H) 10/27/2016   CALCIUM 11.1 (H) 10/26/2016   CALCIUM 10.6 (H) 10/25/2016        Allergies as of 03/20/2017   No Known Allergies     Medication List       Accurate as of 03/20/17  2:00 PM. Always use your most recent med list.          baclofen 10 MG tablet Commonly known as:  LIORESAL Take 10 mg by mouth as needed for muscle spasms.   Biotin  5000 MCG Tabs Take 1 tablet by mouth daily.   calcitRIOL 0.5 MCG capsule Commonly known as:  ROCALTROL Take 1 mcg by mouth 2 (two) times daily.   cycloSPORINE 0.05 % ophthalmic emulsion Commonly known as:  RESTASIS Place 1 drop into both eyes 2 (two) times daily.   DULoxetine 60 MG capsule Commonly known as:  CYMBALTA Take 1 capsule (60 mg total) by mouth daily.   gabapentin 300 MG capsule Commonly known as:  NEURONTIN Take 2 capsules (600 mg total) by mouth 2 (two) times daily.   hydroxychloroquine 200 MG tablet Commonly known as:  PLAQUENIL Take 400 mg by mouth daily.   levothyroxine 125 MCG tablet Commonly known as:  SYNTHROID, LEVOTHROID Take 1 tablet (125 mcg total) by mouth daily.     metoCLOPramide 10 MG tablet Commonly known as:  REGLAN Take 10 mg by mouth every 6 (six) hours as needed for nausea or vomiting.   ondansetron 4 MG tablet Commonly known as:  ZOFRAN Take 1 tablet (4 mg total) by mouth every 8 (eight) hours as needed for nausea or vomiting.   pilocarpine 5 MG tablet Commonly known as:  SALAGEN TAKE 1 TABLET PO TWICE A DAY   tiZANidine 4 MG tablet Commonly known as:  ZANAFLEX Take 4 mg by mouth every 6 (six) hours as needed for muscle spasms.   TOPAMAX 200 MG tablet Generic drug:  topiramate Take 200 mg by mouth at bedtime.   traMADol 50 MG tablet Commonly known as:  ULTRAM Take 2 tablets (100 mg total) by mouth every 12 (twelve) hours as needed.       Allergies: No Known Allergies  Past Medical History:  Diagnosis Date  . Fibromyalgia   . Migraine    Dr Domingo Cocking  . Peripheral neuropathy   . Sjogren's syndrome ([place])    Dr Ouida Sills  . Thyroid nodule     Past Surgical History:  Procedure Laterality Date  . Intrauterine Ablation  2011   Dr Raphael Gibney ( now seeing Dr Christophe Louis)  . lip biopsy  08/2012   Dr Thea Gist  . MANDIBLE SURGERY     TMJ   . THYROIDECTOMY  06/2009   benign nodule  . TUBAL LIGATION      Family History  Problem Relation Age of Onset  . AAA (abdominal aortic aneurysm) Father   . Stroke Mother 40  . Diabetes Mother   . Kidney failure Mother   . Stroke Sister 43  . Hypertension Sister   . Heart attack Maternal Grandfather        ? age  . Heart attack Maternal Grandmother        ? age  . Diabetes Sister   . Hypertension Sister   . Diabetes Brother   . Kidney failure Brother   . Cancer Neg Hx     Social History:  reports that she has never smoked. She has never used smokeless tobacco. She reports that she drinks about 1.8 oz of alcohol per week . She reports that she does not use drugs.     Review of Systems  She is followed by a rheumatologist, has back pain and muscle aches Currently taking  Cymbalta and gabapentin along with baclofen  RENAL dysfunction: Slightly worse recently   Examination:   BP 128/88   Pulse 69   Ht 5' 3.5" (1.613 m)   Wt 200 lb (90.7 kg)   SpO2 97%   BMI 34.87 kg/m   Exam not indicated  Assessment/Plan:   HYPOTHYROIDISM, postsurgical She has required  less levothyroxine for supplementation this year and now taking  125 g since her initial consultation in June TSH is back to normal at 2.5 She had previously done well with levothyroxine supplement for several years  Most likely her complaints of fatigue are related to her insomnia, chronic pain and not related to hypothyroidism   HYPOPARATHYROIDISM, reportedly resulting from her thyroidectomy Calcium is low normal She is not taking calcium supplements and will need to start 500 mg a day at dinnertime She will continue 2 capsules of the 0.5 ug daily  VITAMIN D deficiency:  continue 2000 units daily  Follow-up  with rheumatologist and PCP recommended for other ongoing problems Follow-up in 3 months for thyroid   [facility] 03/20/2017

## 2017-03-20 NOTE — Patient Instructions (Addendum)
Take 500mg  calcium daily at dinner  No Biotin for 2 weeks before next labs

## 2017-05-04 ENCOUNTER — Encounter: Payer: Self-pay | Admitting: Internal Medicine

## 2017-05-04 NOTE — Telephone Encounter (Signed)
Is this something that can be moved to earlier next week?

## 2017-05-09 ENCOUNTER — Ambulatory Visit (INDEPENDENT_AMBULATORY_CARE_PROVIDER_SITE_OTHER): Payer: 59 | Admitting: Internal Medicine

## 2017-05-09 ENCOUNTER — Other Ambulatory Visit (INDEPENDENT_AMBULATORY_CARE_PROVIDER_SITE_OTHER): Payer: 59

## 2017-05-09 ENCOUNTER — Telehealth: Payer: Self-pay | Admitting: Internal Medicine

## 2017-05-09 ENCOUNTER — Encounter: Payer: Self-pay | Admitting: Internal Medicine

## 2017-05-09 VITALS — BP 122/78 | HR 78 | Temp 98.4°F | Resp 16 | Wt 200.0 lb

## 2017-05-09 DIAGNOSIS — E559 Vitamin D deficiency, unspecified: Secondary | ICD-10-CM

## 2017-05-09 DIAGNOSIS — N189 Chronic kidney disease, unspecified: Secondary | ICD-10-CM

## 2017-05-09 DIAGNOSIS — G4700 Insomnia, unspecified: Secondary | ICD-10-CM

## 2017-05-09 DIAGNOSIS — D649 Anemia, unspecified: Secondary | ICD-10-CM

## 2017-05-09 DIAGNOSIS — M797 Fibromyalgia: Secondary | ICD-10-CM

## 2017-05-09 DIAGNOSIS — K5909 Other constipation: Secondary | ICD-10-CM | POA: Diagnosis not present

## 2017-05-09 LAB — CBC WITH DIFFERENTIAL/PLATELET
BASOS PCT: 0.9 % (ref 0.0–3.0)
Basophils Absolute: 0.1 10*3/uL (ref 0.0–0.1)
EOS PCT: 1 % (ref 0.0–5.0)
Eosinophils Absolute: 0.1 10*3/uL (ref 0.0–0.7)
HCT: 30.4 % — ABNORMAL LOW (ref 36.0–46.0)
Hemoglobin: 10.1 g/dL — ABNORMAL LOW (ref 12.0–15.0)
LYMPHS ABS: 4.4 10*3/uL — AB (ref 0.7–4.0)
Lymphocytes Relative: 54.8 % — ABNORMAL HIGH (ref 12.0–46.0)
MCHC: 33.3 g/dL (ref 30.0–36.0)
MCV: 94 fl (ref 78.0–100.0)
MONO ABS: 0.6 10*3/uL (ref 0.1–1.0)
MONOS PCT: 7.7 % (ref 3.0–12.0)
NEUTROS PCT: 35.6 % — AB (ref 43.0–77.0)
Neutro Abs: 2.9 10*3/uL (ref 1.4–7.7)
Platelets: 268 10*3/uL (ref 150.0–400.0)
RBC: 3.24 Mil/uL — ABNORMAL LOW (ref 3.87–5.11)
RDW: 12.7 % (ref 11.5–15.5)
WBC: 8.1 10*3/uL (ref 4.0–10.5)

## 2017-05-09 LAB — VITAMIN D 25 HYDROXY (VIT D DEFICIENCY, FRACTURES): VITD: 32.85 ng/mL (ref 30.00–100.00)

## 2017-05-09 LAB — COMPREHENSIVE METABOLIC PANEL
ALK PHOS: 49 U/L (ref 39–117)
ALT: 16 U/L (ref 0–35)
AST: 21 U/L (ref 0–37)
Albumin: 4.3 g/dL (ref 3.5–5.2)
BILIRUBIN TOTAL: 0.3 mg/dL (ref 0.2–1.2)
BUN: 20 mg/dL (ref 6–23)
CO2: 26 mEq/L (ref 19–32)
CREATININE: 2.07 mg/dL — AB (ref 0.40–1.20)
Calcium: 8.7 mg/dL (ref 8.4–10.5)
Chloride: 107 mEq/L (ref 96–112)
GFR: 31.91 mL/min — ABNORMAL LOW (ref >60.00)
Glucose, Bld: 86 mg/dL (ref 70–99)
Potassium: 3.6 mEq/L (ref 3.5–5.1)
SODIUM: 140 meq/L (ref 135–145)
TOTAL PROTEIN: 7.7 g/dL (ref 6.0–8.3)

## 2017-05-09 LAB — FERRITIN: FERRITIN: 134.4 ng/mL (ref 10.0–291.0)

## 2017-05-09 LAB — IRON: Iron: 78 ug/dL (ref 42–145)

## 2017-05-09 MED ORDER — ESZOPICLONE 2 MG PO TABS: 2.0000 mg | ORAL_TABLET | Freq: Every evening | ORAL | 2 refills | Status: DC | PRN

## 2017-05-09 MED ORDER — ONDANSETRON HCL 4 MG PO TABS: 4.0000 mg | ORAL_TABLET | Freq: Three times a day (TID) | ORAL | 2 refills | Status: DC | PRN

## 2017-05-09 MED ORDER — TIZANIDINE HCL 2 MG PO TABS: 2.0000 mg | ORAL_TABLET | Freq: Four times a day (QID) | ORAL | 2 refills | Status: DC | PRN

## 2017-05-09 NOTE — Assessment & Plan Note (Signed)
cmp

## 2017-05-09 NOTE — Assessment & Plan Note (Addendum)
Trazodone not effective Taking z-quil or benadryl - not working Trial of lunesta 2 mg nightly F/u in one month

## 2017-05-09 NOTE — Progress Notes (Signed)
Subjective:    Patient ID: Terri Roberts, female    DOB: 08-30-1960, 56 y.o.   MRN: [id number]  HPI She is here for an acute visit.   Chronic constipation:  She is taking senokot 1 pill three times a week.  The constipation is getting worse.  She does not tolerate miralax - it makes her nauseous and gag.   Her stool is hard and balls.    Weight gain:  She has not changed her eating habits.  She is not sure if it has to do with changing her synthroid.    She is having some anxiety:  She feels it in her chest - she loses her breath.  She feels SOB when driving.  She is going through a lot.  She is stressed a lot.  It is hard for her to get up and go to work.  Her fibromyalgia pain is constant.  She has finger numbness.  She has so many things going on right now.    Fibromyalgia, chronic pain:  She wonders about disability.  She has difficulty concentrating at work.  She never gets a good nights rest - back pain, neck pain, leg pain.  She has difficulty getting up and going to work after not resting.  It is becoming more difficult.  It is hard to manage life, work and health.    Insomnia:  Trazodone was not effective.  She has tried otc medications and nothing helps.  She just wants to sleep.  She is having more and more difficulty functioning.  She has chronic fatigue.  Medications and allergies reviewed with patient and updated if appropriate.  Patient Active Problem List   Diagnosis Date Noted  . CKD (chronic kidney disease) 10/24/2016  . Acute renal failure (ARF) ([place]) 10/24/2016  . Normocytic anemia 10/24/2016  . Periodic limb movement 10/09/2016  . T12 burst fracture ([place]) 08/05/2016  . OSA on CPAP 04/01/2016  . Insomnia 01/12/2016  . Chronic pain syndrome 01/12/2016  . Joint pain 01/12/2016  . Numbness and tingling 01/12/2016  . Chronic fatigue 01/12/2016  . Depression 01/12/2016  . Neck pain 06/10/2015  . Bilateral calf pain 03/05/2015  . Positive D dimer 03/05/2015  .  Fibromyalgia 03/04/2015  . Postprocedural hypothyroidism 03/04/2015  . Postprocedural hypoparathyroidism ([place]) 03/04/2015  . Sjogren's syndrome ([place]) 11/01/2012  . Headache(784.0) 05/21/2010  . THYROID NODULE 03/10/2009  . PALPITATIONS 03/03/2009    Current Outpatient Prescriptions on File Prior to Visit  Medication Sig Dispense Refill  . baclofen (LIORESAL) 10 MG tablet Take 10 mg by mouth as needed for muscle spasms.    . Biotin 5000 MCG TABS Take 1 tablet by mouth daily.    . calcitRIOL (ROCALTROL) 0.5 MCG capsule Take 1 mcg by mouth 2 (two) times daily.  10  . cycloSPORINE (RESTASIS) 0.05 % ophthalmic emulsion Place 1 drop into both eyes 2 (two) times daily.    . DULoxetine (CYMBALTA) 60 MG capsule Take 1 capsule (60 mg total) by mouth daily. 90 capsule 1  . gabapentin (NEURONTIN) 300 MG capsule Take 2 capsules (600 mg total) by mouth 2 (two) times daily. 90 capsule 0  . hydroxychloroquine (PLAQUENIL) 200 MG tablet Take 400 mg by mouth daily.     Marland Kitchen levothyroxine (SYNTHROID, LEVOTHROID) 125 MCG tablet Take 1 tablet (125 mcg total) by mouth daily. 90 tablet 3  . metoCLOPramide (REGLAN) 10 MG tablet Take 10 mg by mouth every 6 (six) hours as needed for nausea  or vomiting.     . ondansetron (ZOFRAN) 4 MG tablet Take 1 tablet (4 mg total) by mouth every 8 (eight) hours as needed for nausea or vomiting. 20 tablet 0  . pilocarpine (SALAGEN) 5 MG tablet TAKE 1 TABLET PO TWICE A DAY  3  . tiZANidine (ZANAFLEX) 4 MG tablet Take 4 mg by mouth every 6 (six) hours as needed for muscle spasms.     Marland Kitchen topiramate (TOPAMAX) 200 MG tablet Take 200 mg by mouth at bedtime.     . traMADol (ULTRAM) 50 MG tablet Take 2 tablets (100 mg total) by mouth every 12 (twelve) hours as needed. (Patient taking differently: Take 100 mg by mouth every 12 (twelve) hours as needed for moderate pain or severe pain. ) 120 tablet 0   No current facility-administered medications on file prior to visit.     Past Medical  History:  Diagnosis Date  . Fibromyalgia   . Migraine    Dr Domingo Cocking  . Peripheral neuropathy   . Sjogren's syndrome ([place])    Dr Ouida Sills  . Thyroid nodule     Past Surgical History:  Procedure Laterality Date  . Intrauterine Ablation  2011   Dr Raphael Gibney ( now seeing Dr Christophe Louis)  . lip biopsy  08/2012   Dr Thea Gist  . MANDIBLE SURGERY     TMJ   . THYROIDECTOMY  06/2009   benign nodule  . TUBAL LIGATION      Social History   Social History  . Marital status: Widowed    Spouse name: N/A  . Number of children: 2  . Years of education: 14   Occupational History  . Product Compliance Specialist    Social History Main Topics  . Smoking status: Never Smoker  . Smokeless tobacco: Never Used  . Alcohol use 1.8 oz/week    3 Shots of liquor per week     Comment:  3 shots of liquor per week  . Drug use: No  . Sexual activity: Not on file   Other Topics Concern  . Not on file   Social History Narrative   Lives at home with her daughter.   Right-handed.   One cup caffeine per day.    Family History  Problem Relation Age of Onset  . AAA (abdominal aortic aneurysm) Father   . Stroke Mother 80  . Diabetes Mother   . Kidney failure Mother   . Stroke Sister 73  . Hypertension Sister   . Heart attack Maternal Grandfather        ? age  . Heart attack Maternal Grandmother        ? age  . Diabetes Sister   . Hypertension Sister   . Diabetes Brother   . Kidney failure Brother   . Cancer Neg Hx     Review of Systems  Constitutional: Negative for fever.  Respiratory: Negative for cough, shortness of breath and wheezing.   Cardiovascular: Positive for leg swelling ( mild). Negative for chest pain and palpitations (occ flutter with anxiety).  Gastrointestinal: Positive for abdominal pain, constipation and nausea. Negative for blood in stool.  Musculoskeletal: Positive for back pain, myalgias and neck pain.  Neurological: Positive for light-headedness and headaches.         Objective:   Vitals:   05/09/17 1453  BP: 122/78  Pulse: 78  Resp: 16  Temp: 98.4 F (36.9 C)  SpO2: 96%   Filed Weights   05/09/17 1453  Weight: 220  lb (99.8 kg)   Body mass index is 38.36 kg/m.  Wt Readings from Last 3 Encounters:  05/09/17 220 lb (99.8 kg)  03/20/17 200 lb (90.7 kg)  01/17/17 195 lb (88.5 kg)     Physical Exam Constitutional: Appears well-developed and well-nourished. No distress.  HENT:  Head: Normocephalic and atraumatic.  Neck: Neck supple. No tracheal deviation present. No thyromegaly present.  No cervical lymphadenopathy Cardiovascular: Normal rate, regular rhythm and normal heart sounds.   No murmur heard. No carotid bruit .  No edema Pulmonary/Chest: Effort normal and breath sounds normal. No respiratory distress. No has no wheezes. No rales.  Abdomen: soft, non tender, non distended Skin: Skin is warm and dry. Not diaphoretic.  Psychiatric: depressed mood and affect. Behavior is normal.         Assessment & Plan:   See Problem List for Assessment and Plan of chronic medical problems.

## 2017-05-09 NOTE — Assessment & Plan Note (Addendum)
Chronic pain, not controlled Taking tramadol, gabapentin, tizanidine, cymbalta Following with Dr Amil Amen

## 2017-05-09 NOTE — Assessment & Plan Note (Signed)
Taking vitamin d Check level 

## 2017-05-09 NOTE — Assessment & Plan Note (Signed)
Taking senokot three times a week - advised to take daily 1-2 pills May need colace, probiotic or change in diet Can consider linzess if needed

## 2017-05-09 NOTE — Patient Instructions (Signed)
  Test(s) ordered today. Your results will be released to [place] (or called to you) after review, usually within 72hours after test completion. If any changes need to be made, you will be notified at that same time.   Medications reviewed and updated.  Changes include decreasing dose of tizanidine to 2 mg and trying lunesta for sleep.   Take senokot and colace regularly for constipation.  Your prescription(s) have been submitted to your pharmacy. Please take as directed and contact our office if you believe you are having problem(s) with the medication(s).   Please followup in 4 weeks

## 2017-05-09 NOTE — Telephone Encounter (Signed)
Weight corrected, it was entered wrong

## 2017-05-09 NOTE — Assessment & Plan Note (Signed)
Check cbc, iron, ferritin 

## 2017-05-09 NOTE — Telephone Encounter (Signed)
Patient at check out today believe her AVS was wrong and does not believe her weight was 220 but it was 200  Please advise

## 2017-05-11 ENCOUNTER — Encounter: Payer: Self-pay | Admitting: Internal Medicine

## 2017-05-12 ENCOUNTER — Ambulatory Visit: Payer: 59 | Admitting: Internal Medicine

## 2017-05-16 ENCOUNTER — Other Ambulatory Visit: Payer: 59

## 2017-05-19 ENCOUNTER — Ambulatory Visit: Payer: 59 | Admitting: Endocrinology

## 2017-05-30 ENCOUNTER — Other Ambulatory Visit (HOSPITAL_COMMUNITY)
Admission: RE | Admit: 2017-05-30 | Discharge: 2017-05-30 | Disposition: A | Discharge status: Home / Self Care | Payer: 59 | Source: Ambulatory Visit | Attending: Obstetrics and Gynecology | Admitting: Obstetrics and Gynecology

## 2017-05-30 ENCOUNTER — Other Ambulatory Visit: Payer: Self-pay | Admitting: Obstetrics and Gynecology

## 2017-05-30 DIAGNOSIS — Z124 Encounter for screening for malignant neoplasm of cervix: Secondary | ICD-10-CM | POA: Insufficient documentation

## 2017-06-01 LAB — CYTOLOGY - PAP
DIAGNOSIS: NEGATIVE
HPV (WINDOPATH): DETECTED — AB
HPV 16/18/45 GENOTYPING: POSITIVE — AB

## 2017-06-06 NOTE — Progress Notes (Signed)
Subjective:    Patient ID: Terri Roberts, female    DOB: [birth year], 56 y.o.   MRN: [id number]  HPI The patient is here for follow up.  Insomnia:  We started her on lunesta 6 weeks ago.  She is getting 5-6 hrs of sleep. She often has difficulty sleeping due to leg or back pain. She is seeing other doctors for the pain. She denies side effects.    Chronic constipation:  She was taking senokot 3 times a week and at her last visit I advised increasing that to 1-2 pills daily.  She is taking colace daily. She has a bowel movement from every other day to every couple of days.  She does have to strain.    Fibromyalia, chronic pain syndrome:  She is following wiht Dr Amil Amen.  She is taking tramamdol, gabapentin, tizanidine and cymbalta.    Numbness/tingling in right/left fingers, chronic neck pain, had resent MRI of neck:  She just had an MRI of her neck and has a follow up with her back specialist.    Medications and allergies reviewed with patient and updated if appropriate.  Patient Active Problem List   Diagnosis Date Noted  . Vitamin D deficiency 05/09/2017  . CKD (chronic kidney disease) 10/24/2016  . Normocytic anemia 10/24/2016  . Periodic limb movement 10/09/2016  . T12 burst fracture ([place]) 08/05/2016  . OSA on CPAP 04/01/2016  . Insomnia 01/12/2016  . Chronic pain syndrome 01/12/2016  . Joint pain 01/12/2016  . Numbness and tingling 01/12/2016  . Chronic fatigue 01/12/2016  . Depression 01/12/2016  . Neck pain 06/10/2015  . Bilateral calf pain 03/05/2015  . Fibromyalgia 03/04/2015  . Postprocedural hypothyroidism 03/04/2015  . Postprocedural hypoparathyroidism ([place]) 03/04/2015  . Sjogren's syndrome ([place]) 11/01/2012  . Headache(784.0) 05/21/2010  . Chronic constipation 05/14/2009  . THYROID NODULE 03/10/2009  . PALPITATIONS 03/03/2009    Current Outpatient Medications on File Prior to Visit  Medication Sig Dispense Refill  . baclofen (LIORESAL) 10 MG tablet Take  10 mg by mouth as needed for muscle spasms.    . Biotin 5000 MCG TABS Take 1 tablet by mouth daily.    . calcitRIOL (ROCALTROL) 0.5 MCG capsule Take 1 mcg by mouth 2 (two) times daily.  10  . cycloSPORINE (RESTASIS) 0.05 % ophthalmic emulsion Place 1 drop into both eyes 2 (two) times daily.    . DULoxetine (CYMBALTA) 60 MG capsule Take 1 capsule (60 mg total) by mouth daily. 90 capsule 1  . eszopiclone (LUNESTA) 2 MG TABS tablet Take 1 tablet (2 mg total) by mouth at bedtime as needed for sleep. Take immediately before bedtime 30 tablet 2  . gabapentin (NEURONTIN) 300 MG capsule Take 2 capsules (600 mg total) by mouth 2 (two) times daily. 90 capsule 0  . hydroxychloroquine (PLAQUENIL) 200 MG tablet Take 400 mg by mouth daily.     Marland Kitchen levothyroxine (SYNTHROID, LEVOTHROID) 125 MCG tablet Take 1 tablet (125 mcg total) by mouth daily. 90 tablet 3  . metoCLOPramide (REGLAN) 10 MG tablet Take 10 mg by mouth every 6 (six) hours as needed for nausea or vomiting.     . ondansetron (ZOFRAN) 4 MG tablet Take 1 tablet (4 mg total) by mouth every 8 (eight) hours as needed for nausea or vomiting. 30 tablet 2  . pilocarpine (SALAGEN) 5 MG tablet TAKE 1 TABLET PO TWICE A DAY  3  . tiZANidine (ZANAFLEX) 2 MG tablet Take 1 tablet (2 mg total) by  mouth every 6 (six) hours as needed for muscle spasms. 60 tablet 2  . topiramate (TOPAMAX) 200 MG tablet Take 200 mg by mouth at bedtime.     . traMADol (ULTRAM) 50 MG tablet Take 2 tablets (100 mg total) by mouth every 12 (twelve) hours as needed. (Patient taking differently: Take 100 mg by mouth every 12 (twelve) hours as needed for moderate pain or severe pain. ) 120 tablet 0   No current facility-administered medications on file prior to visit.     Past Medical History:  Diagnosis Date  . Fibromyalgia   . Migraine    Dr Domingo Cocking  . Peripheral neuropathy   . Sjogren's syndrome ([place])    Dr Ouida Sills  . Thyroid nodule     Past Surgical History:  Procedure  Laterality Date  . Intrauterine Ablation  2011   Dr Raphael Gibney ( now seeing Dr Christophe Louis)  . lip biopsy  08/2012   Dr Thea Gist  . MANDIBLE SURGERY     TMJ   . THYROIDECTOMY  06/2009   benign nodule  . TUBAL LIGATION      Social History   Socioeconomic History  . Marital status: Widowed    Spouse name: Not on file  . Number of children: 2  . Years of education: 89  . Highest education level: Not on file  Social Needs  . Financial resource strain: Not on file  . Food insecurity - worry: Not on file  . Food insecurity - inability: Not on file  . Transportation needs - medical: Not on file  . Transportation needs - non-medical: Not on file  Occupational History  . Occupation: Product Compliance Specialist  Tobacco Use  . Smoking status: Never Smoker  . Smokeless tobacco: Never Used  Substance and Sexual Activity  . Alcohol use: Yes    Alcohol/week: 1.8 oz    Types: 3 Shots of liquor per week    Comment:  3 shots of liquor per week  . Drug use: No  . Sexual activity: Not on file  Other Topics Concern  . Not on file  Social History Narrative   Lives at home with her daughter.   Right-handed.   One cup caffeine per day.    Family History  Problem Relation Age of Onset  . AAA (abdominal aortic aneurysm) Father   . Stroke Mother 48  . Diabetes Mother   . Kidney failure Mother   . Stroke Sister 73  . Hypertension Sister   . Heart attack Maternal Grandfather        ? age  . Heart attack Maternal Grandmother        ? age  . Diabetes Sister   . Hypertension Sister   . Diabetes Brother   . Kidney failure Brother   . Cancer Neg Hx     Review of Systems  Gastrointestinal: Positive for abdominal distention, abdominal pain (mild from constipation), constipation and nausea (occ). Negative for blood in stool.  Musculoskeletal: Positive for back pain, myalgias and neck pain.  Neurological: Positive for numbness. Negative for headaches (controlled).       Objective:    Vitals:   06/07/17 1357  BP: 122/74  Pulse: 72  Resp: 16  Temp: 98 F (36.7 C)  SpO2: 94%   Wt Readings from Last 3 Encounters:  06/07/17 200 lb (90.7 kg)  05/09/17 200 lb (90.7 kg)  03/20/17 200 lb (90.7 kg)   Body mass index is 34.87 kg/m.   Physical  Exam    Constitutional: Appears well-developed and well-nourished. No distress.  HENT:  Head: Normocephalic and atraumatic.  Cardiovascular: Normal rate, regular rhythm and normal heart sounds.   No murmur heard.  No edema Pulmonary/Chest: Effort normal and breath sounds normal. No respiratory distress. No has no wheezes. No rales.  Abdomen: soft, non tender, non distended Skin: Skin is warm and dry. Not diaphoretic.  Psychiatric: Normal mood and affect. Behavior is normal.      Assessment & Plan:    See Problem List for Assessment and Plan of chronic medical problems.

## 2017-06-07 ENCOUNTER — Ambulatory Visit (INDEPENDENT_AMBULATORY_CARE_PROVIDER_SITE_OTHER): Payer: 59 | Admitting: Internal Medicine

## 2017-06-07 ENCOUNTER — Encounter: Payer: Self-pay | Admitting: Internal Medicine

## 2017-06-07 DIAGNOSIS — G4700 Insomnia, unspecified: Secondary | ICD-10-CM

## 2017-06-07 DIAGNOSIS — K5909 Other constipation: Secondary | ICD-10-CM | POA: Diagnosis not present

## 2017-06-07 MED ORDER — LINACLOTIDE 145 MCG PO CAPS
145.0000 ug | ORAL_CAPSULE | Freq: Every day | ORAL | 5 refills | Status: DC
Start: 2017-06-07 — End: 2018-07-31

## 2017-06-07 NOTE — Assessment & Plan Note (Signed)
Not improved with taking senokot and colace regularly BM every other day to every two days - has to strain Will try linzess 145 mcg daily

## 2017-06-07 NOTE — Assessment & Plan Note (Signed)
Improved with lunesta 2 mg nightly - getting 5-6 hrs a night Some of her sleep difficulty now is her chronic back and leg pain -- following with specialists for this Sleep is much better - will continue lunesta 2 mg nightly

## 2017-06-07 NOTE — Patient Instructions (Signed)
Try linzess for your constipation.  We can increase the dose if needed.    Continue the lunesta for your sleep.     Follow up in 6 months.

## 2017-06-08 ENCOUNTER — Telehealth: Payer: Self-pay | Admitting: Emergency Medicine

## 2017-06-08 NOTE — Telephone Encounter (Signed)
PA completed for Linzess, awaiting approval. Key [id number]

## 2017-06-08 NOTE — Telephone Encounter (Signed)
PA approved, pharmacy notified.

## 2017-06-23 IMAGING — US US RENAL
1 series · 14 of 25 positions shown · non-contrast
Comparison: Lumbar spine MRI from 07/13/2015

CLINICAL DATA: Acute renal failure.

EXAM:
RENAL / URINARY TRACT ULTRASOUND COMPLETE

[Series 1: us renal · 0.25mm/px · 14 of 58 slices shown]
[im 1/58]
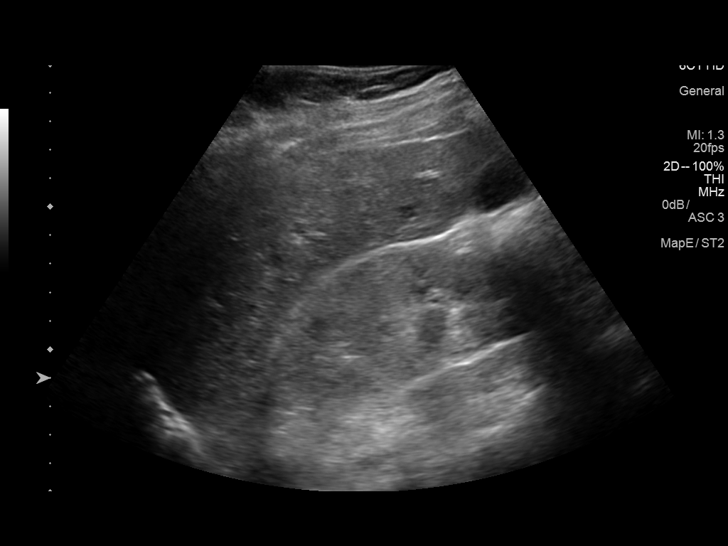
[im 5/58]
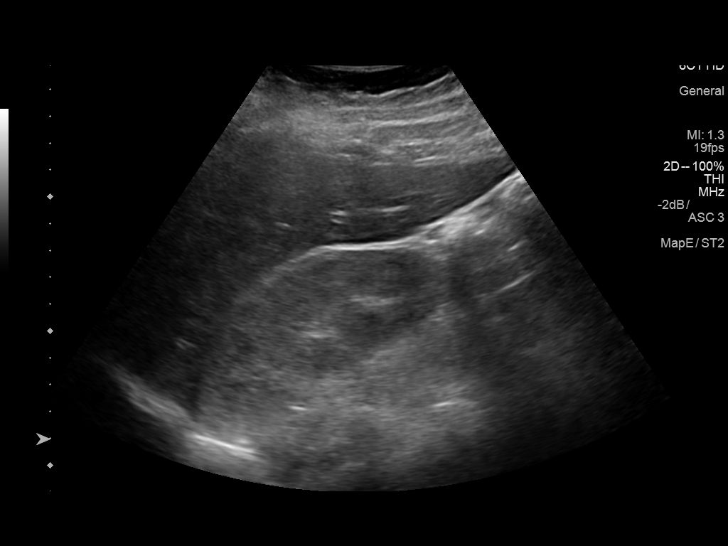
[im 10/58]
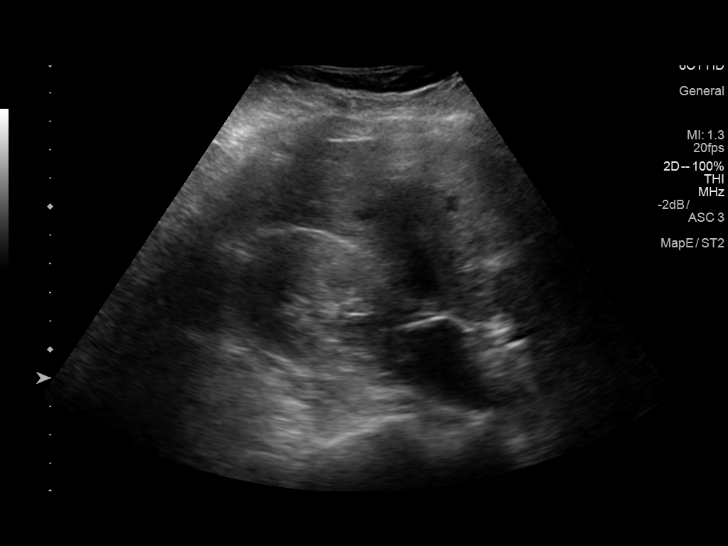
[im 15/58]
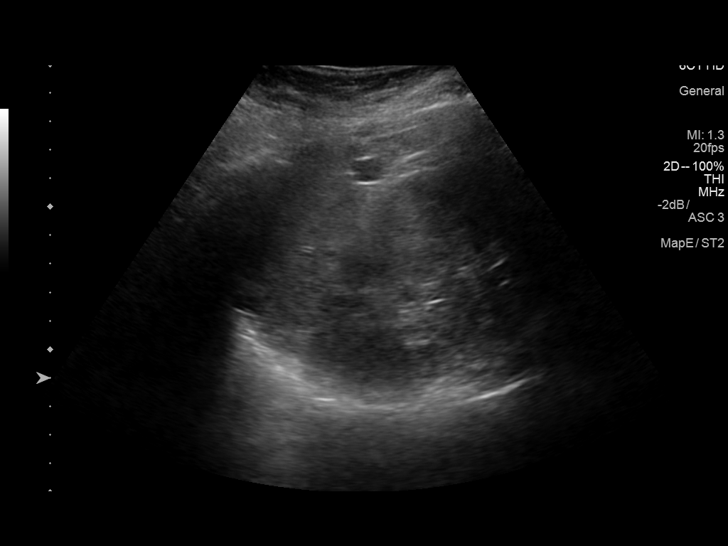
[im 20/58]
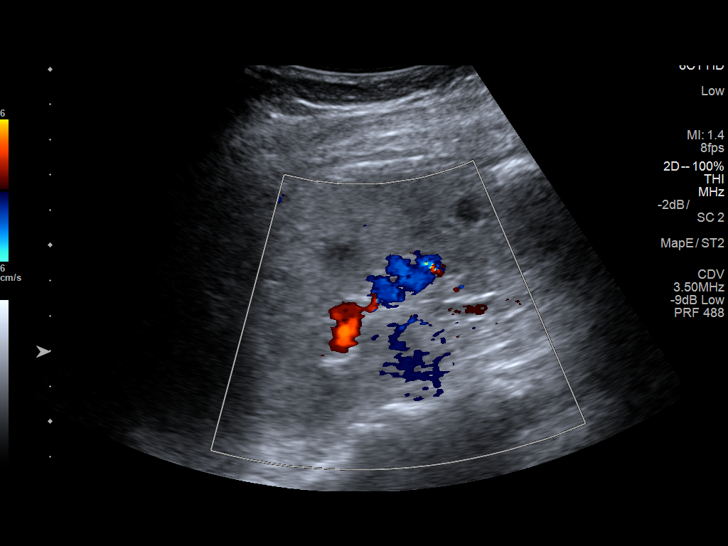
[im 22/58]
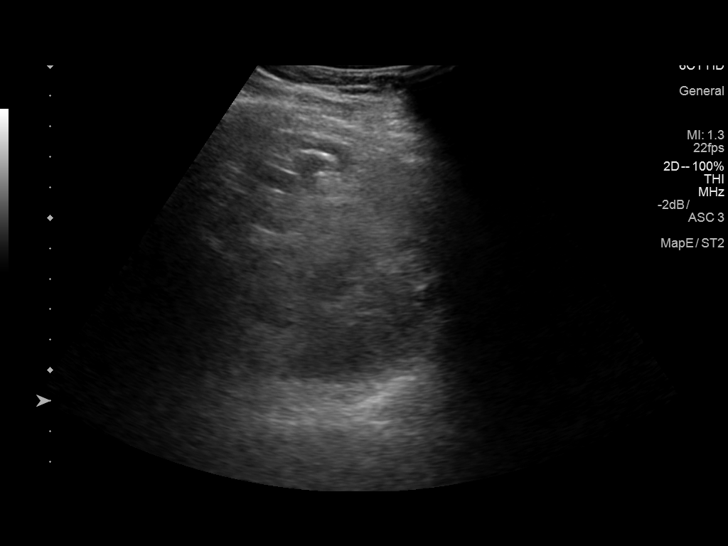
[im 27/58]
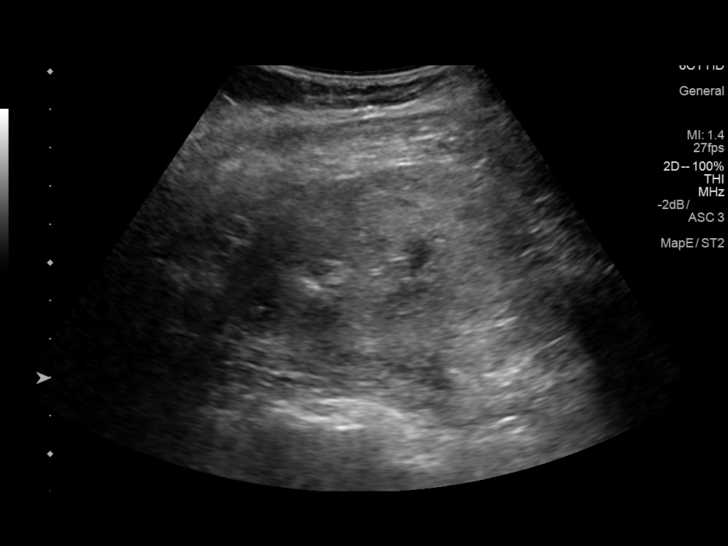
[im 31/58]
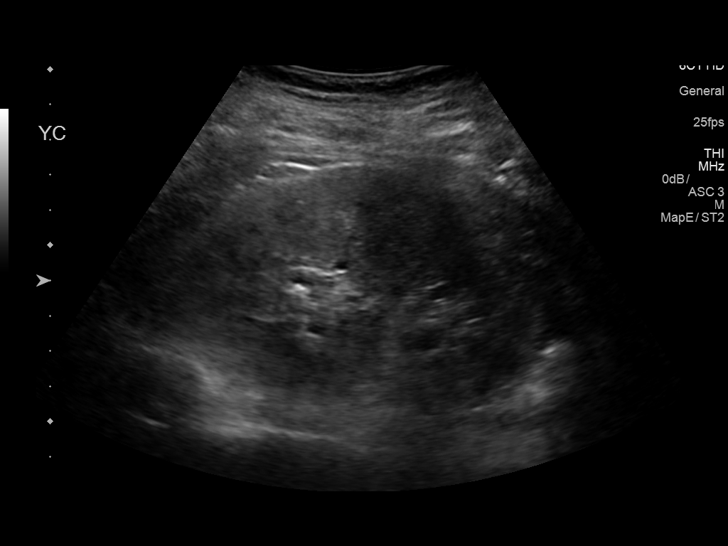
[im 36/58]
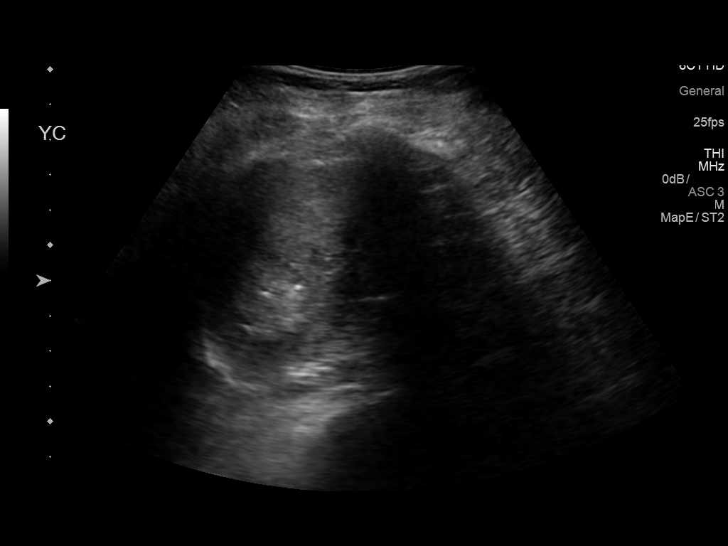
[im 39/58]
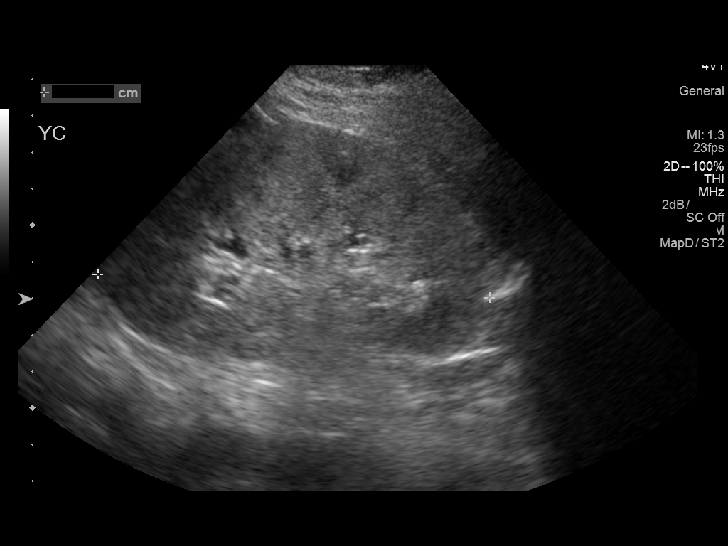
[im 43/58]
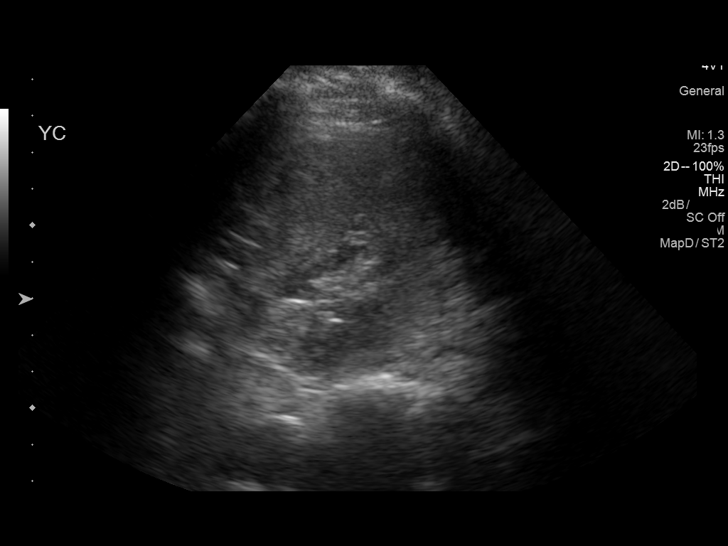
[im 48/58]
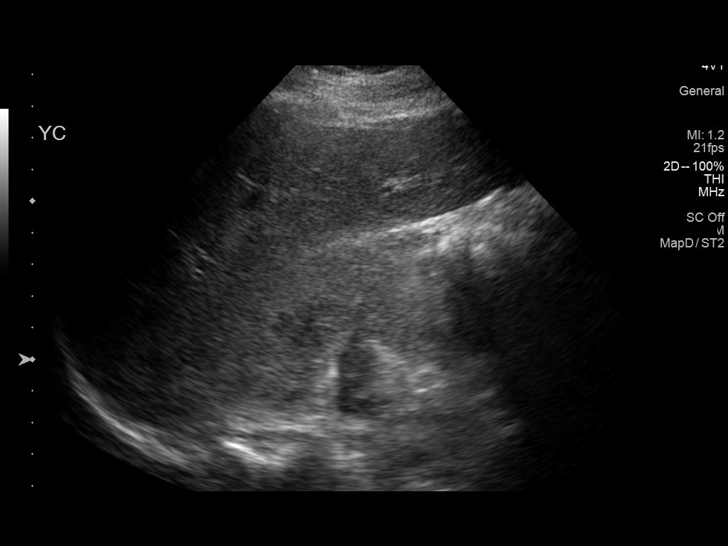
[im 53/58]
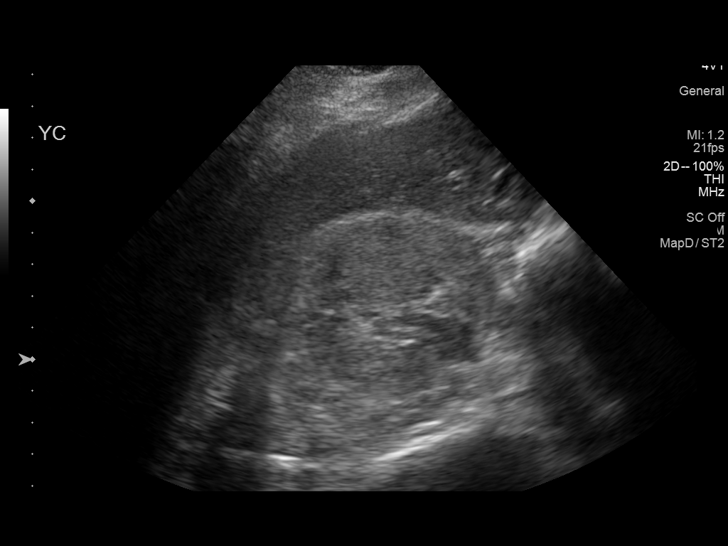
[im 58/58]
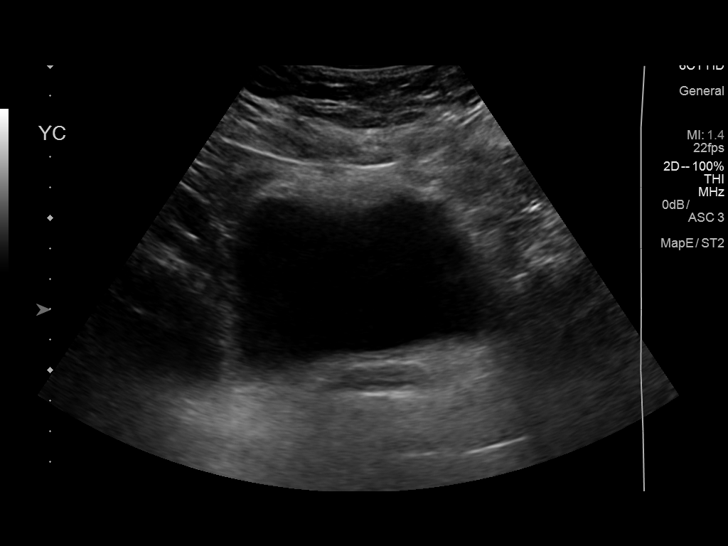

[14 of 25 positions shown; findings below may reference images not displayed]

FINDINGS: Right Kidney:

Length: 11.1 cm. Diffusely hyperechoic, significantly more so than
the adjacent liver. No mass or hydronephrosis visualized.

Left Kidney:

Length: 10.8 cm. Diffusely hyperechoic. No atrophy. No mass or
hydronephrosis visualized.

Bladder:

Appears normal for degree of bladder distention.
IMPRESSION: 1. Diffusely hyperechoic kidneys, not overtly atrophic. No
hydronephrosis. Chronic medical renal disease could have this
appearance, as can not a variety of other conditions such as
diabetic nephropathy and HIV nephropathy.

## 2017-06-27 ENCOUNTER — Other Ambulatory Visit: Payer: Self-pay | Admitting: Obstetrics and Gynecology

## 2017-08-22 ENCOUNTER — Encounter: Payer: Self-pay | Admitting: Internal Medicine

## 2017-08-23 ENCOUNTER — Telehealth: Payer: 59 | Admitting: Family

## 2017-08-23 DIAGNOSIS — J208 Acute bronchitis due to other specified organisms: Secondary | ICD-10-CM

## 2017-08-23 DIAGNOSIS — B9689 Other specified bacterial agents as the cause of diseases classified elsewhere: Secondary | ICD-10-CM

## 2017-08-23 MED ORDER — AZITHROMYCIN 250 MG PO TABS: ORAL_TABLET | ORAL | 0 refills | Status: DC

## 2017-08-23 MED ORDER — BENZONATATE 100 MG PO CAPS: 100.0000 mg | ORAL_CAPSULE | Freq: Three times a day (TID) | ORAL | 0 refills | Status: DC | PRN

## 2017-08-23 NOTE — Progress Notes (Signed)

## 2017-08-25 ENCOUNTER — Other Ambulatory Visit: Payer: Self-pay

## 2017-08-25 ENCOUNTER — Encounter (HOSPITAL_COMMUNITY): Payer: Self-pay | Admitting: Emergency Medicine

## 2017-08-25 DIAGNOSIS — R05 Cough: Secondary | ICD-10-CM | POA: Diagnosis present

## 2017-08-25 DIAGNOSIS — R062 Wheezing: Secondary | ICD-10-CM | POA: Insufficient documentation

## 2017-08-25 DIAGNOSIS — Z79899 Other long term (current) drug therapy: Secondary | ICD-10-CM | POA: Diagnosis not present

## 2017-08-25 DIAGNOSIS — N189 Chronic kidney disease, unspecified: Secondary | ICD-10-CM | POA: Diagnosis not present

## 2017-08-25 NOTE — ED Triage Notes (Signed)
Patient complaining of a cough that she has had for about 3 weeks. Patient was put on antibiotics yesterday.

## 2017-08-26 ENCOUNTER — Emergency Department (HOSPITAL_COMMUNITY): Payer: 59

## 2017-08-26 ENCOUNTER — Emergency Department (HOSPITAL_COMMUNITY)
Admission: EM | Admit: 2017-08-26 | Discharge: 2017-08-26 | Disposition: A | Discharge status: Home / Self Care | Payer: 59 | Attending: Emergency Medicine | Admitting: Emergency Medicine

## 2017-08-26 DIAGNOSIS — R059 Cough, unspecified: Secondary | ICD-10-CM

## 2017-08-26 DIAGNOSIS — R05 Cough: Secondary | ICD-10-CM

## 2017-08-26 MED ORDER — ALBUTEROL SULFATE HFA 108 (90 BASE) MCG/ACT IN AERS
2.0000 | INHALATION_SPRAY | Freq: Once | RESPIRATORY_TRACT | Status: AC
  Administered 2017-08-26: 2 via RESPIRATORY_TRACT
  Filled 2017-08-26: qty 6.7

## 2017-08-26 NOTE — ED Provider Notes (Signed)
[facility] DEPT Provider Note   CSN: [id number] Arrival date & time: 08/25/17  1939     History   Chief Complaint Chief Complaint  Patient presents with  . Cough    HPI Terri Roberts is a 57 y.o. female.   57 year old female presents to the emergency department for evaluation of cough for the past 3 weeks.  Symptoms associated with chest congestion as well as "rattling" in her chest.  She reports a sore throat as well.  She was seen at an E-visit by [facility] yesterday.  They placed the patient on an antibiotic.  She has been taking azithromycin as prescribed as well as over-the-counter Mucinex.  She does not believe these medications are helping her.  She presents today for onset of wheezing.  She has no history of asthma.  No fever or sick contacts.     Past Medical History:  Diagnosis Date  . Fibromyalgia   . Migraine    Dr Domingo Cocking  . Peripheral neuropathy   . Sjogren's syndrome ([place])    Dr Ouida Sills  . Thyroid nodule     Patient Active Problem List   Diagnosis Date Noted  . Vitamin D deficiency 05/09/2017  . CKD (chronic kidney disease) 10/24/2016  . Normocytic anemia 10/24/2016  . Periodic limb movement 10/09/2016  . T12 burst fracture ([place]) 08/05/2016  . OSA on CPAP 04/01/2016  . Insomnia 01/12/2016  . Chronic pain syndrome 01/12/2016  . Joint pain 01/12/2016  . Numbness and tingling 01/12/2016  . Chronic fatigue 01/12/2016  . Depression 01/12/2016  . Neck pain 06/10/2015  . Bilateral calf pain 03/05/2015  . Fibromyalgia 03/04/2015  . Postprocedural hypothyroidism 03/04/2015  . Postprocedural hypoparathyroidism ([place]) 03/04/2015  . Sjogren's syndrome ([place]) 11/01/2012  . Headache(784.0) 05/21/2010  . Chronic constipation 05/14/2009  . THYROID NODULE 03/10/2009  . PALPITATIONS 03/03/2009    Past Surgical History:  Procedure Laterality Date  . Intrauterine Ablation  2011   Dr Raphael Gibney ( now seeing Dr Christophe Louis)  . lip  biopsy  08/2012   Dr Thea Gist  . MANDIBLE SURGERY     TMJ   . THYROIDECTOMY  06/2009   benign nodule  . TUBAL LIGATION      OB History    No data available       Home Medications    Prior to Admission medications   Medication Sig Start Date End Date Taking? Authorizing Provider  azithromycin (ZITHROMAX) 250 MG tablet Take 500 mg once, then 250 mg for four days 08/23/17   Evelina Dun A, FNP  baclofen (LIORESAL) 10 MG tablet Take 10 mg by mouth as needed for muscle spasms.    [provider]  benzonatate (TESSALON PERLES) 100 MG capsule Take 1 capsule (100 mg total) by mouth 3 (three) times daily as needed. 08/23/17   Evelina Dun A, FNP  Biotin 5000 MCG TABS Take 1 tablet by mouth daily.    [provider]  calcitRIOL (ROCALTROL) 0.5 MCG capsule Take 1 mcg by mouth 2 (two) times daily. 03/12/16   [provider]  cycloSPORINE (RESTASIS) 0.05 % ophthalmic emulsion Place 1 drop into both eyes 2 (two) times daily.    [provider]  DULoxetine (CYMBALTA) 60 MG capsule Take 1 capsule (60 mg total) by mouth daily. 10/07/16   [name], MD  eszopiclone (LUNESTA) 2 MG TABS tablet Take 1 tablet (2 mg total) by mouth at bedtime as needed for sleep. Take immediately before bedtime  05/09/17   [name], MD  gabapentin (NEURONTIN) 300 MG capsule Take 2 capsules (600 mg total) by mouth 2 (two) times daily. 03/30/16   [name], MD  hydroxychloroquine (PLAQUENIL) 200 MG tablet Take 400 mg by mouth daily.     [provider]  levothyroxine (SYNTHROID, LEVOTHROID) 125 MCG tablet Take 1 tablet (125 mcg total) by mouth daily. 01/19/17   Elayne Snare, MD  linaclotide [facility]) 145 MCG CAPS capsule Take 1 capsule (145 mcg total) daily before breakfast by mouth. 06/07/17   [name], MD  metoCLOPramide (REGLAN) 10 MG tablet Take 10 mg by mouth every 6 (six) hours as needed for nausea or vomiting.  10/18/16   [provider]  ondansetron  (ZOFRAN) 4 MG tablet Take 1 tablet (4 mg total) by mouth every 8 (eight) hours as needed for nausea or vomiting. 05/09/17   [name], MD  pilocarpine (SALAGEN) 5 MG tablet TAKE 1 TABLET PO TWICE A DAY 03/12/16   [provider]  tiZANidine (ZANAFLEX) 2 MG tablet Take 1 tablet (2 mg total) by mouth every 6 (six) hours as needed for muscle spasms. 05/09/17   [name], MD  topiramate (TOPAMAX) 200 MG tablet Take 200 mg by mouth at bedtime.     [provider]  traMADol (ULTRAM) 50 MG tablet Take 2 tablets (100 mg total) by mouth every 12 (twelve) hours as needed. Patient taking differently: Take 100 mg by mouth every 12 (twelve) hours as needed for moderate pain or severe pain.  08/05/16   [name], MD    Family History Family History  Problem Relation Age of Onset  . AAA (abdominal aortic aneurysm) Father   . Stroke Mother 9  . Diabetes Mother   . Kidney failure Mother   . Stroke Sister 42  . Hypertension Sister   . Heart attack Maternal Grandfather        ? age  . Heart attack Maternal Grandmother        ? age  . Diabetes Sister   . Hypertension Sister   . Diabetes Brother   . Kidney failure Brother   . Cancer Neg Hx     Social History Social History   Tobacco Use  . Smoking status: Never Smoker  . Smokeless tobacco: Never Used  Substance Use Topics  . Alcohol use: Yes    Alcohol/week: 1.8 oz    Types: 3 Shots of liquor per week    Comment:  3 shots of liquor per week  . Drug use: No     Allergies   Patient has no known allergies.   Review of Systems Review of Systems Ten systems reviewed and are negative for acute change, except as noted in the HPI.    Physical Exam Updated Vital Signs BP 119/61   Pulse 70   Temp 98.4 F (36.9 C) (Oral)   Resp 16   Ht 5' 3.5" (1.613 m)   Wt 88.5 kg (195 lb)   SpO2 99%   BMI 34.00 kg/m   Physical Exam  Constitutional: She is oriented to person, place, and time. She appears well-developed  and well-nourished. No distress.  Nontoxic appearing and in NAD  HENT:  Head: Normocephalic and atraumatic.  Eyes: Conjunctivae and EOM are normal. No scleral icterus.  Neck: Normal range of motion.  Cardiovascular: Normal rate, regular rhythm and intact distal pulses.  Pulmonary/Chest: Effort normal. No stridor. No respiratory distress. She has no  wheezes. She has no rales.  Respirations even and unlabored. Lungs CTAB.  Musculoskeletal: Normal range of motion.  Neurological: She is alert and oriented to person, place, and time. She exhibits normal muscle tone. Coordination normal.  GCS 15. Patient moving all extremities.  Skin: Skin is warm and dry. No rash noted. She is not diaphoretic. No erythema. No pallor.  Psychiatric: She has a normal mood and affect. Her behavior is normal.  Nursing note and vitals reviewed.    ED Treatments / Results  Labs (all labs ordered are listed, but only abnormal results are displayed) Labs Reviewed - No data to display  EKG  EKG Interpretation None       Radiology Dg Chest 2 View  Result Date: 08/26/2017 CLINICAL DATA:  Productive cough for 3 weeks. Shortness of breath and chest pain. Nonsmoker. EXAM: CHEST  2 VIEW COMPARISON:  03/03/2009 FINDINGS: Normal heart size and pulmonary vascularity. No focal airspace disease or consolidation in the lungs. No blunting of costophrenic angles. No pneumothorax. Mediastinal contours appear intact. Surgical clips in the base of the neck and in the thoracolumbar spine. IMPRESSION: No active cardiopulmonary disease. Electronically Signed   By: Lucienne Capers M.D.   On: 08/26/2017 00:52    Procedures Procedures (including critical care time)  Medications Ordered in ED Medications  albuterol (PROVENTIL HFA;VENTOLIN HFA) 108 (90 Base) MCG/ACT inhaler 2 puff (2 puffs Inhalation Given 08/26/17 0200)     Initial Impression / Assessment and Plan / ED Course  I have reviewed the triage vital signs and the  nursing notes.  Pertinent labs & imaging results that were available during my care of the patient were reviewed by me and considered in my medical decision making (see chart for details).     Pt CXR negative for acute infiltrate. Patient's symptoms are consistent with URI, likely viral etiology. Patient will be discharged with symptomatic treatment. She has been told to continue antibiotics as previously prescribed. She verbalizes understanding and is agreeable with plan. Return precautions discussed and provided. Patient discharged in stable condition with no unaddressed concerns.   Final Clinical Impressions(s) / ED Diagnoses   Final diagnoses:  Cough    ED Discharge Orders    None       Antonietta Breach, PA-C 08/26/17 0300    Palumbo, April, MD 08/26/17 [phone]

## 2017-08-31 NOTE — Progress Notes (Signed)
Subjective:    Patient ID: Terri Roberts, female    DOB: 28-Dec-1960, 57 y.o.   MRN: [id number]  HPI She is here for an acute visit for cold symptoms.  Her cold symptoms started the last weekend in December.    She did an evisit on 1/23 and was prescribed a zpak, which may have helped a little.  She went to the ED on 1/26 and was prescribed an inhaler.    Her symptoms are better, but still not gone.    She is experiencing a productive cough with sometimes discolored sputum, rhinorrhea, hoarseness, mild diarrhea, headaches and lightheadedness.  Most of her other symptoms have improved.    She had some prednisone leftover and took some of that the past two days.  She is taking the tessalon perles and is still using the inhaler.  She has used vicks.       Medications and allergies reviewed with patient and updated if appropriate.  Patient Active Problem List   Diagnosis Date Noted  . Vitamin D deficiency 05/09/2017  . CKD (chronic kidney disease) 10/24/2016  . Normocytic anemia 10/24/2016  . Periodic limb movement 10/09/2016  . T12 burst fracture ([place]) 08/05/2016  . OSA on CPAP 04/01/2016  . Insomnia 01/12/2016  . Chronic pain syndrome 01/12/2016  . Joint pain 01/12/2016  . Numbness and tingling 01/12/2016  . Chronic fatigue 01/12/2016  . Depression 01/12/2016  . Neck pain 06/10/2015  . Bilateral calf pain 03/05/2015  . Fibromyalgia 03/04/2015  . Postprocedural hypothyroidism 03/04/2015  . Postprocedural hypoparathyroidism ([place]) 03/04/2015  . Sjogren's syndrome ([place]) 11/01/2012  . Headache(784.0) 05/21/2010  . Chronic constipation 05/14/2009  . THYROID NODULE 03/10/2009  . PALPITATIONS 03/03/2009    Current Outpatient Medications on File Prior to Visit  Medication Sig Dispense Refill  . baclofen (LIORESAL) 10 MG tablet Take 10 mg by mouth as needed for muscle spasms.    . benzonatate (TESSALON PERLES) 100 MG capsule Take 1 capsule (100 mg total) by mouth 3 (three)  times daily as needed. 20 capsule 0  . Biotin 5000 MCG TABS Take 1 tablet by mouth daily.    . calcitRIOL (ROCALTROL) 0.5 MCG capsule Take 1 mcg by mouth 2 (two) times daily.  10  . cycloSPORINE (RESTASIS) 0.05 % ophthalmic emulsion Place 1 drop into both eyes 2 (two) times daily.    . DULoxetine (CYMBALTA) 60 MG capsule Take 1 capsule (60 mg total) by mouth daily. 90 capsule 1  . eszopiclone (LUNESTA) 2 MG TABS tablet Take 1 tablet (2 mg total) by mouth at bedtime as needed for sleep. Take immediately before bedtime 30 tablet 2  . gabapentin (NEURONTIN) 300 MG capsule Take 2 capsules (600 mg total) by mouth 2 (two) times daily. 90 capsule 0  . hydroxychloroquine (PLAQUENIL) 200 MG tablet Take 400 mg by mouth daily.     Marland Kitchen levothyroxine (SYNTHROID, LEVOTHROID) 125 MCG tablet Take 1 tablet (125 mcg total) by mouth daily. 90 tablet 3  . linaclotide (LINZESS) 145 MCG CAPS capsule Take 1 capsule (145 mcg total) daily before breakfast by mouth. 30 capsule 5  . metoCLOPramide (REGLAN) 10 MG tablet Take 10 mg by mouth every 6 (six) hours as needed for nausea or vomiting.     . ondansetron (ZOFRAN) 4 MG tablet Take 1 tablet (4 mg total) by mouth every 8 (eight) hours as needed for nausea or vomiting. 30 tablet 2  . pilocarpine (SALAGEN) 5 MG tablet TAKE 1 TABLET PO  TWICE A DAY  3  . tiZANidine (ZANAFLEX) 2 MG tablet Take 1 tablet (2 mg total) by mouth every 6 (six) hours as needed for muscle spasms. 60 tablet 2  . topiramate (TOPAMAX) 200 MG tablet Take 200 mg by mouth at bedtime.     . traMADol (ULTRAM) 50 MG tablet Take 2 tablets (100 mg total) by mouth every 12 (twelve) hours as needed. (Patient taking differently: Take 100 mg by mouth every 12 (twelve) hours as needed for moderate pain or severe pain. ) 120 tablet 0   No current facility-administered medications on file prior to visit.     Past Medical History:  Diagnosis Date  . Fibromyalgia   . Migraine    Dr Domingo Cocking  . Peripheral neuropathy     . Sjogren's syndrome ([place])    Dr Ouida Sills  . Thyroid nodule     Past Surgical History:  Procedure Laterality Date  . Intrauterine Ablation  2011   Dr Raphael Gibney ( now seeing Dr Christophe Louis)  . lip biopsy  08/2012   Dr Thea Gist  . MANDIBLE SURGERY     TMJ   . THYROIDECTOMY  06/2009   benign nodule  . TUBAL LIGATION      Social History   Socioeconomic History  . Marital status: Widowed    Spouse name: None  . Number of children: 2  . Years of education: 32  . Highest education level: None  Social Needs  . Financial resource strain: None  . Food insecurity - worry: None  . Food insecurity - inability: None  . Transportation needs - medical: None  . Transportation needs - non-medical: None  Occupational History  . Occupation: Product Compliance Specialist  Tobacco Use  . Smoking status: Never Smoker  . Smokeless tobacco: Never Used  Substance and Sexual Activity  . Alcohol use: Yes    Alcohol/week: 1.8 oz    Types: 3 Shots of liquor per week    Comment:  3 shots of liquor per week  . Drug use: No  . Sexual activity: None  Other Topics Concern  . None  Social History Narrative   Lives at home with her daughter.   Right-handed.   One cup caffeine per day.    Family History  Problem Relation Age of Onset  . AAA (abdominal aortic aneurysm) Father   . Stroke Mother 43  . Diabetes Mother   . Kidney failure Mother   . Stroke Sister 71  . Hypertension Sister   . Heart attack Maternal Grandfather        ? age  . Heart attack Maternal Grandmother        ? age  . Diabetes Sister   . Hypertension Sister   . Diabetes Brother   . Kidney failure Brother   . Cancer Neg Hx     Review of Systems  Constitutional: Negative for chills and fever.  HENT: Positive for rhinorrhea and voice change. Negative for congestion, ear pain, sinus pressure, sinus pain and sore throat.   Respiratory: Positive for cough (productive). Negative for shortness of breath and wheezing.    Gastrointestinal: Positive for diarrhea (little). Negative for nausea.  Musculoskeletal: Negative for myalgias.  Neurological: Positive for light-headedness and headaches.       Objective:   Vitals:   09/01/17 1136  BP: 116/66  Pulse: 89  Resp: 16  Temp: 98.4 F (36.9 C)  SpO2: 96%   Filed Weights   09/01/17 1136  Weight: 200  lb (90.7 kg)   Body mass index is 34.87 kg/m.  Wt Readings from Last 3 Encounters:  09/01/17 200 lb (90.7 kg)  08/25/17 195 lb (88.5 kg)  06/07/17 200 lb (90.7 kg)     Physical Exam GENERAL APPEARANCE: Appears stated age, well appearing, NAD EYES: conjunctiva clear, no icterus HEENT: bilateral tympanic membranes and ear canals normal, oropharynx with mild erythema, no thyromegaly, trachea midline, no cervical or supraclavicular lymphadenopathy LUNGS: Clear to auscultation without wheeze or crackles, unlabored breathing, good air entry bilaterally CARDIOVASCULAR: Normal S1,S2 without murmurs, no edema SKIN: warm, dry        Assessment & Plan:   See Problem List for Assessment and Plan of chronic medical problems.

## 2017-09-01 ENCOUNTER — Encounter: Payer: Self-pay | Admitting: Internal Medicine

## 2017-09-01 ENCOUNTER — Ambulatory Visit (INDEPENDENT_AMBULATORY_CARE_PROVIDER_SITE_OTHER): Payer: 59 | Admitting: Internal Medicine

## 2017-09-01 VITALS — BP 116/66 | HR 89 | Temp 98.4°F | Resp 16 | Wt 200.0 lb

## 2017-09-01 DIAGNOSIS — J209 Acute bronchitis, unspecified: Secondary | ICD-10-CM

## 2017-09-01 MED ORDER — DOXYCYCLINE HYCLATE 100 MG PO TABS
100.0000 mg | ORAL_TABLET | Freq: Two times a day (BID) | ORAL | 0 refills | Status: DC
Start: 2017-09-01 — End: 2017-10-25

## 2017-09-01 NOTE — Patient Instructions (Signed)
Take the doxycycline twice daily for 10 days - complete the entire course.   Continue all the over the counter medications.   Continue increased rest and fluids.  Call if no improvement    Your prescription(s) have been submitted to your pharmacy or been printed and provided for you. Please take as directed and contact our office if you believe you are having problem(s) with the medication(s) or have any questions.  If your symptoms worsen or fail to improve, please contact our office for further instruction, or in case of emergency go directly to the emergency room at the closest medical facility.   General Recommendations:    Please drink plenty of fluids.  Get plenty of rest   Sleep in humidified air  Use saline nasal sprays  Netti pot  OTC Medications:  Decongestants - helps relieve congestion   Flonase (generic fluticasone) or Nasacort (generic triamcinolone) - please make sure to use the "cross-over" technique at a 45 degree angle towards the opposite eye as opposed to straight up the nasal passageway.   Sudafed (generic pseudoephedrine - Note this is the one that is available behind the pharmacy counter); Products with phenylephrine (-PE) may also be used but is often not as effective as pseudoephedrine.   If you have HIGH BLOOD PRESSURE - Coricidin HBP; AVOID any product that is -D as this contains pseudoephedrine which may increase your blood pressure.  Afrin (oxymetazoline) every 6-8 hours for up to 3 days.  Allergies - helps relieve runny nose, itchy eyes and sneezing   Claritin (generic loratidine), Allegra (fexofenidine), or Zyrtec (generic cyrterizine) for runny nose. These medications should not cause drowsiness.  Note - Benadryl (generic diphenhydramine) may be used however may cause drowsiness  Cough -   Delsym or Robitussin (generic dextromethorphan)  Expectorants - helps loosen mucus to ease removal   Mucinex (generic guaifenesin)  as directed on the package.  Headaches / General Aches   Tylenol (generic acetaminophen) - DO NOT EXCEED 3 grams (3,000 mg) in a 24 hour time period  Advil/Motrin (generic ibuprofen)  Sore Throat -   Salt water gargle   Chloraseptic (generic benzocaine) spray or lozenges / Sucrets (generic dyclonine)

## 2017-09-01 NOTE — Assessment & Plan Note (Signed)
Persistent symptoms, esp productive cough despite taking the zpak and otc cold medications Concern for infection resistant to the zpak Will prescribe doxycycline Continue otc cold medications, rest/fluids  Call if no improvement

## 2017-09-10 MED ORDER — CALCITRIOL 0.25 MCG PO CAPS
ORAL_CAPSULE | ORAL | Status: DC
Start: 2017-09-11 — End: 2017-09-10

## 2017-10-25 ENCOUNTER — Encounter: Payer: Self-pay | Admitting: Internal Medicine

## 2017-10-25 ENCOUNTER — Ambulatory Visit (INDEPENDENT_AMBULATORY_CARE_PROVIDER_SITE_OTHER): Payer: 59 | Admitting: Internal Medicine

## 2017-10-25 VITALS — BP 96/68 | HR 83 | Temp 98.2°F | Resp 16 | Wt 196.0 lb

## 2017-10-25 DIAGNOSIS — R202 Paresthesia of skin: Secondary | ICD-10-CM | POA: Diagnosis not present

## 2017-10-25 DIAGNOSIS — R6889 Other general symptoms and signs: Secondary | ICD-10-CM | POA: Insufficient documentation

## 2017-10-25 NOTE — Progress Notes (Signed)
Subjective:    Patient ID: Terri Roberts, female    DOB: 1961-06-16, 57 y.o.   MRN: [id number]  HPI The patient is here for an acute visit.  Left finger tingling:  She started having tingling in her 4-5th fingers in the tips of her fingers about 6 weeks ago.  She saw a doctor in [place] for cold symptoms and her arm symptoms.  She was referred to a neurologist and had an MRI of her neck.  There was no pinched nerve.  The neurologist did not give her an explanation for the tingling.  She was referred for PT, which she will start today.  The tingling is moving to the middle finger.  The only thing that helps is keeping the hand warm - having a brace on it or having heat on it.  She wondered it it was tennis elbow and she has been wearing an elbow brace for over one week w/o improvement.  It is very aggravating.  If she turns over in bed she feels it.  It is not painful - just constant tingling. Nothing makes it worse - it is just there, constantly.  She has had accupunture and it did not help.  She also did massage therapy and it did not help.     She is hot/cold all the time.  She can not seem to be in a happy median.  She constantly puts on a giant and has to take it off.  This happens throughout the day and night.  It is very aggravating and she does not understand why.  Medications and allergies reviewed with patient and updated if appropriate.  Patient Active Problem List   Diagnosis Date Noted  . Acute bronchitis 09/01/2017  . Vitamin D deficiency 05/09/2017  . CKD (chronic kidney disease) 10/24/2016  . Normocytic anemia 10/24/2016  . Periodic limb movement 10/09/2016  . T12 burst fracture ([place]) 08/05/2016  . OSA on CPAP 04/01/2016  . Insomnia 01/12/2016  . Chronic pain syndrome 01/12/2016  . Joint pain 01/12/2016  . Numbness and tingling 01/12/2016  . Chronic fatigue 01/12/2016  . Depression 01/12/2016  . Neck pain 06/10/2015  . Bilateral calf pain 03/05/2015  .  Fibromyalgia 03/04/2015  . Postprocedural hypothyroidism 03/04/2015  . Postprocedural hypoparathyroidism ([place]) 03/04/2015  . Sjogren's syndrome ([place]) 11/01/2012  . Headache(784.0) 05/21/2010  . Chronic constipation 05/14/2009  . THYROID NODULE 03/10/2009  . PALPITATIONS 03/03/2009    Current Outpatient Medications on File Prior to Visit  Medication Sig Dispense Refill  . baclofen (LIORESAL) 10 MG tablet Take 10 mg by mouth as needed for muscle spasms.    . Biotin 5000 MCG TABS Take 1 tablet by mouth daily.    . calcitRIOL (ROCALTROL) 0.5 MCG capsule Take 1 mcg by mouth 2 (two) times daily.  10  . cycloSPORINE (RESTASIS) 0.05 % ophthalmic emulsion Place 1 drop into both eyes 2 (two) times daily.    . eszopiclone (LUNESTA) 2 MG TABS tablet Take 1 tablet (2 mg total) by mouth at bedtime as needed for sleep. Take immediately before bedtime 30 tablet 2  . gabapentin (NEURONTIN) 300 MG capsule Take 2 capsules (600 mg total) by mouth 2 (two) times daily. 90 capsule 0  . hydroxychloroquine (PLAQUENIL) 200 MG tablet Take 400 mg by mouth daily.     Marland Kitchen levothyroxine (SYNTHROID, LEVOTHROID) 125 MCG tablet Take 1 tablet (125 mcg total) by mouth daily. 90 tablet 3  . linaclotide (LINZESS) 145 MCG CAPS capsule  Take 1 capsule (145 mcg total) daily before breakfast by mouth. 30 capsule 5  . metoCLOPramide (REGLAN) 10 MG tablet Take 10 mg by mouth every 6 (six) hours as needed for nausea or vomiting.     . ondansetron (ZOFRAN) 4 MG tablet Take 1 tablet (4 mg total) by mouth every 8 (eight) hours as needed for nausea or vomiting. 30 tablet 2  . pilocarpine (SALAGEN) 5 MG tablet TAKE 1 TABLET PO TWICE A DAY  3  . tiZANidine (ZANAFLEX) 2 MG tablet Take 1 tablet (2 mg total) by mouth every 6 (six) hours as needed for muscle spasms. 60 tablet 2  . topiramate (TOPAMAX) 200 MG tablet Take 200 mg by mouth at bedtime.     . traMADol (ULTRAM) 50 MG tablet Take 2 tablets (100 mg total) by mouth every 12 (twelve) hours  as needed. (Patient taking differently: Take 100 mg by mouth every 12 (twelve) hours as needed for moderate pain or severe pain. ) 120 tablet 0   No current facility-administered medications on file prior to visit.     Past Medical History:  Diagnosis Date  . Fibromyalgia   . Migraine    Dr Domingo Cocking  . Peripheral neuropathy   . Sjogren's syndrome ([place])    Dr Ouida Sills  . Thyroid nodule     Past Surgical History:  Procedure Laterality Date  . Intrauterine Ablation  2011   Dr Raphael Gibney ( now seeing Dr Christophe Louis)  . lip biopsy  08/2012   Dr Thea Gist  . MANDIBLE SURGERY     TMJ   . THYROIDECTOMY  06/2009   benign nodule  . TUBAL LIGATION      Social History   Socioeconomic History  . Marital status: Widowed    Spouse name: Not on file  . Number of children: 2  . Years of education: 18  . Highest education level: Not on file  Occupational History  . Occupation: Product Compliance Specialist  Social Needs  . Financial resource strain: Not on file  . Food insecurity:    Worry: Not on file    Inability: Not on file  . Transportation needs:    Medical: Not on file    Non-medical: Not on file  Tobacco Use  . Smoking status: Never Smoker  . Smokeless tobacco: Never Used  Substance and Sexual Activity  . Alcohol use: Yes    Alcohol/week: 1.8 oz    Types: 3 Shots of liquor per week    Comment:  3 shots of liquor per week  . Drug use: No  . Sexual activity: Not on file  Lifestyle  . Physical activity:    Days per week: Not on file    Minutes per session: Not on file  . Stress: Not on file  Relationships  . Social connections:    Talks on phone: Not on file    Gets together: Not on file    Attends religious service: Not on file    Active member of club or organization: Not on file    Attends meetings of clubs or organizations: Not on file    Relationship status: Not on file  Other Topics Concern  . Not on file  Social History Narrative   Lives at home with her  daughter.   Right-handed.   One cup caffeine per day.    Family History  Problem Relation Age of Onset  . AAA (abdominal aortic aneurysm) Father   . Stroke Mother 40  .  Diabetes Mother   . Kidney failure Mother   . Stroke Sister 4  . Hypertension Sister   . Heart attack Maternal Grandfather        ? age  . Heart attack Maternal Grandmother        ? age  . Diabetes Sister   . Hypertension Sister   . Diabetes Brother   . Kidney failure Brother   . Cancer Neg Hx     Review of Systems  Constitutional: Negative for chills and fever.  Musculoskeletal: Positive for neck pain and neck stiffness.  Skin: Negative for color change and rash.  Neurological: Positive for numbness (Tingling left fingers) and headaches (Occasionally). Negative for weakness.       Objective:   Vitals:   10/25/17 1024  BP: 96/68  Pulse: 83  Resp: 16  Temp: 98.2 F (36.8 C)  SpO2: 97%   BP Readings from Last 3 Encounters:  10/25/17 96/68  09/01/17 116/66  08/26/17 119/61   Wt Readings from Last 3 Encounters:  10/25/17 196 lb (88.9 kg)  09/01/17 200 lb (90.7 kg)  08/25/17 195 lb (88.5 kg)   Body mass index is 34.18 kg/m.   Physical Exam  Constitutional: She appears well-developed and well-nourished. No distress.  HENT:  Head: Normocephalic and atraumatic.  Eyes: Conjunctivae are normal.  Musculoskeletal: She exhibits no edema.  No atrophy left hand, full range of motion left arm;; tightness in neck and upper back  Neurological: She exhibits normal muscle tone.  Decreased sensation left fourth-fifth fingers-most of her fifth finger and tip of fourth finger, slight decreased sensation middle tip of finger; normal strength left hand and arm  Skin: Skin is warm and dry. No rash noted. She is not diaphoretic. No erythema.           Assessment & Plan:    See Problem List for Assessment and Plan of chronic medical problems.

## 2017-10-25 NOTE — Assessment & Plan Note (Signed)
Unsure of cause Advised her to f/u with endocrine to have thyroid rechecked and evaluate for other causes

## 2017-10-25 NOTE — Patient Instructions (Signed)
A referral for hand orthopedics was ordered.   They will call you to schedule an appointment.

## 2017-10-25 NOTE — Assessment & Plan Note (Signed)
Left 4-5 th fingers, mild in middle finger tip No pain Saw neuro, mri of neck - no cervical pinched nerve Will do PT today Will refer to hand ortho

## 2017-10-30 ENCOUNTER — Other Ambulatory Visit: Payer: 59

## 2017-11-01 ENCOUNTER — Ambulatory Visit: Payer: 59 | Admitting: Endocrinology

## 2017-11-22 ENCOUNTER — Other Ambulatory Visit (INDEPENDENT_AMBULATORY_CARE_PROVIDER_SITE_OTHER): Payer: 59

## 2017-11-22 DIAGNOSIS — E890 Postprocedural hypothyroidism: Secondary | ICD-10-CM

## 2017-11-22 DIAGNOSIS — E892 Postprocedural hypoparathyroidism: Secondary | ICD-10-CM

## 2017-11-22 LAB — BASIC METABOLIC PANEL
BUN: 32 mg/dL — AB (ref 6–23)
CALCIUM: 8.6 mg/dL (ref 8.4–10.5)
CO2: 22 mEq/L (ref 19–32)
Chloride: 111 mEq/L (ref 96–112)
Creatinine, Ser: 1.86 mg/dL — ABNORMAL HIGH (ref 0.40–1.20)
GFR: 36.04 mL/min — AB (ref >60.00)
Glucose, Bld: 73 mg/dL (ref 70–99)
POTASSIUM: 3.5 meq/L (ref 3.5–5.1)
SODIUM: 142 meq/L (ref 135–145)

## 2017-11-22 LAB — TSH: TSH: 0.04 u[IU]/mL — ABNORMAL LOW (ref 0.35–4.50)

## 2017-11-22 LAB — T3, FREE

## 2017-11-22 LAB — T4, FREE: FREE T4: 1.97 ng/dL — AB (ref 0.60–1.60)

## 2017-11-24 ENCOUNTER — Ambulatory Visit (INDEPENDENT_AMBULATORY_CARE_PROVIDER_SITE_OTHER): Payer: 59 | Admitting: Endocrinology

## 2017-11-24 ENCOUNTER — Encounter: Payer: Self-pay | Admitting: Endocrinology

## 2017-11-24 VITALS — BP 128/78 | HR 84 | Ht 63.5 in | Wt 193.2 lb

## 2017-11-24 DIAGNOSIS — E890 Postprocedural hypothyroidism: Secondary | ICD-10-CM | POA: Diagnosis not present

## 2017-11-24 MED ORDER — LEVOTHYROXINE SODIUM 100 MCG PO TABS: 100.0000 ug | ORAL_TABLET | Freq: Every day | ORAL | 3 refills | Status: DC

## 2017-11-24 NOTE — Progress Notes (Signed)
Patient ID: Terri Roberts, female   DOB: [birth year], 57 y.o.   MRN: [id number]             Reason for Appointment:  thyroid and calcium follow-up    History of Present Illness:   The patient's thyroid enlargement was first discovered in 2010 and was recommended thyroidectomy based on her needle aspiration biopsy Subsequently has been on thyroid supplementation  She had been on 175 g of levothyroxine and followed by an endocrinologist Subsequently her dose was reduced progressively and on her last visit she was taking 137 g Because of her TSH being consistently low this was further reduced down to 125 g  Recent history:  She has not been seen in follow-up since 03/2017 when her dose was continued unchanged at 125 mcg daily  She came in because she is complaining of feeling excessively hot at times and sometimes cold also At night may have some sweating He has not had any significant fatigue She thinks her appetite is somewhat decreased and has lost weight No palpitations, tremor, shakiness  She takes her thyroid supplement before breakfast by itself daily regularly She still does take biotin, this has been a long-term supplement  TSH is unusually low and her free T4 is increased compared to her last visit   Wt Readings from Last 3 Encounters:  11/24/17 193 lb 3.2 oz (87.6 kg)  10/25/17 196 lb (88.9 kg)  09/01/17 200 lb (90.7 kg)    Lab Results  Component Value Date   TSH 0.04 (L) 11/22/2017   TSH 2.54 03/15/2017   TSH 0.21 (L) 01/17/2017   FREET4 1.97 (H) 11/22/2017   FREET4 1.18 03/15/2017   FREET4 1.75 (H) 01/17/2017    Prior evaluation: She  had an ultrasound exam in 2010 showing the following: The dominant nodule is in the inferior aspect of the left lobe and measures 4.0 x 2.4 x 2.1 cm with some calcifications. Multiple nodules are noted throughout the right lobe with the largest in the inferior right lobe measuring 1.4 x 1.0 x 1.2 cm and appearing solid.    Thyroid biopsy Reported on 04/16/09 showed the following   LEFT THYROID NODULE, FINE NEEDLE ASPIRATION, THIN PREP There is colloid and blood with follicular epithelium which is in sheets and small aggregates. The features favor an adenomatous or hyperplastic follicular lesion.    PROBLEM 2: HYPOCALCEMIA  The patient started having muscle cramps in her hands and feet and was found to have hypocalcemia in 2013 but no details are available She was told that this may have been related to her thyroid surgery and was started on calcitriol Has not been  taking any calcium supplements  Her calcitriol dose has been reduced to 2 capsules a day since 10/28/16 when her calcium was high  Subsequently calcium has been consistently normal No muscle cramps now  She had a low vitamin D of about 16 and she has started taking 2000 units of vitamin D3 as directed    Lab Results  Component Value Date   CALCIUM 8.6 11/22/2017   CALCIUM 8.7 05/09/2017   CALCIUM 8.6 03/15/2017   CALCIUM 9.0 01/17/2017   CALCIUM 9.2 12/01/2016   CALCIUM 10.4 (H) 10/27/2016        Allergies as of 11/24/2017   No Known Allergies     Medication List        Accurate as of 11/24/17 10:31 AM. Always use your most recent med list.  baclofen 10 MG tablet Commonly known as:  LIORESAL Take 10 mg by mouth as needed for muscle spasms.   Biotin 5000 MCG Tabs Take 1 tablet by mouth daily.   calcitRIOL 0.5 MCG capsule Commonly known as:  ROCALTROL Take 1 mcg by mouth 2 (two) times daily.   cycloSPORINE 0.05 % ophthalmic emulsion Commonly known as:  RESTASIS Place 1 drop into both eyes 2 (two) times daily.   eszopiclone 2 MG Tabs tablet Commonly known as:  LUNESTA Take 1 tablet (2 mg total) by mouth at bedtime as needed for sleep. Take immediately before bedtime   gabapentin 300 MG capsule Commonly known as:  NEURONTIN Take 2 capsules (600 mg total) by mouth 2 (two) times daily.    hydroxychloroquine 200 MG tablet Commonly known as:  PLAQUENIL Take 400 mg by mouth daily.   levothyroxine 125 MCG tablet Commonly known as:  SYNTHROID, LEVOTHROID Take 1 tablet (125 mcg total) by mouth daily.   linaclotide 145 MCG Caps capsule Commonly known as:  LINZESS Take 1 capsule (145 mcg total) daily before breakfast by mouth.   metoCLOPramide 10 MG tablet Commonly known as:  REGLAN Take 10 mg by mouth every 6 (six) hours as needed for nausea or vomiting.   ondansetron 4 MG tablet Commonly known as:  ZOFRAN Take 1 tablet (4 mg total) by mouth every 8 (eight) hours as needed for nausea or vomiting.   pilocarpine 5 MG tablet Commonly known as:  SALAGEN TAKE 1 TABLET PO TWICE A DAY   tiZANidine 2 MG tablet Commonly known as:  ZANAFLEX Take 1 tablet (2 mg total) by mouth every 6 (six) hours as needed for muscle spasms.   TOPAMAX 200 MG tablet Generic drug:  topiramate Take 200 mg by mouth at bedtime.   traMADol 50 MG tablet Commonly known as:  ULTRAM Take 2 tablets (100 mg total) by mouth every 12 (twelve) hours as needed.       Allergies: No Known Allergies  Past Medical History:  Diagnosis Date  . Fibromyalgia   . Migraine    Dr Domingo Cocking  . Peripheral neuropathy   . Sjogren's syndrome ([place])    Dr Ouida Sills  . Thyroid nodule     Past Surgical History:  Procedure Laterality Date  . Intrauterine Ablation  2011   Dr Raphael Gibney ( now seeing Dr Christophe Louis)  . lip biopsy  08/2012   Dr Thea Gist  . MANDIBLE SURGERY     TMJ   . THYROIDECTOMY  06/2009   benign nodule  . TUBAL LIGATION      Family History  Problem Relation Age of Onset  . AAA (abdominal aortic aneurysm) Father   . Stroke Mother 5  . Diabetes Mother   . Kidney failure Mother   . Stroke Sister 29  . Hypertension Sister   . Heart attack Maternal Grandfather        ? age  . Heart attack Maternal Grandmother        ? age  . Diabetes Sister   . Hypertension Sister   . Diabetes Brother    . Kidney failure Brother   . Cancer Neg Hx     Social History:  reports that she has never smoked. She has never used smokeless tobacco. She reports that she drinks about 1.8 oz of alcohol per week. She reports that she does not use drugs.     Review of Systems  She is followed by a rheumatologist, has back pain and  muscle aches Currently taking Cymbalta and gabapentin along with baclofen  RENAL dysfunction: Slightly worse recently   Examination:   BP 128/78 (BP Location: Left Arm, Patient Position: Sitting, Cuff Size: Normal)   Pulse 84   Ht 5' 3.5" (1.613 m)   Wt 193 lb 3.2 oz (87.6 kg)   SpO2 97%   BMI 33.69 kg/m   No tremor Biceps reflexes difficult to elicit and ankle reflexes slightly brisk Skin not diaphoretic  Assessment/Plan:   HYPOTHYROIDISM, postsurgical She has required  less levothyroxine for supplementation in 2018 and now again appears to be getting excessive doses with taking 125 mcg  She is mostly complaining of heat intolerance and no other typical symptoms of hyperthyroidism but her TSH is suppressed Although she is taking biotin her free T4 is higher than on the last visit Most likely her unusually high free T3 is related to biotin  For now she will reduce her dose to 100 mcg and follow-up in 6 weeks again She will not take biotin for 3 days prior to her test  HYPOPARATHYROIDISM, reportedly resulting from her thyroidectomy Calcium is back to normal continue to monitor on the same regimen  Elayne Snare 11/24/2017

## 2017-11-27 ENCOUNTER — Other Ambulatory Visit: Payer: Self-pay

## 2017-11-27 ENCOUNTER — Telehealth: Payer: Self-pay

## 2017-11-27 ENCOUNTER — Other Ambulatory Visit: Payer: Self-pay | Admitting: Endocrinology

## 2017-11-27 MED ORDER — CALCITRIOL 0.5 MCG PO CAPS: ORAL_CAPSULE | ORAL | 6 refills | Status: DC

## 2017-11-27 NOTE — Telephone Encounter (Signed)
Please fill this prescription

## 2017-11-27 NOTE — Telephone Encounter (Signed)
Patient requesting calcitriol but this has not been prescribed by you before is this ok to fill please advise

## 2017-11-27 NOTE — Telephone Encounter (Signed)
Done

## 2017-11-30 NOTE — Progress Notes (Signed)
Subjective:    Patient ID: Terri Roberts, female    DOB: May 11, 1961, 57 y.o.   MRN: [id number]  HPI The patient is here for an acute visit.   Irritated mole:  She has had mole on her left upper back /shoulder for years, but two days ago it became red, swollen and tender.  There is a firmness underneath the mall.  She denies any trauma or injury to the area.  She has not had any fevers or chills.  The area has gotten better it is less sore and red, but it has not resolved completely.  She denies any other skin concerns.  She has chronic kidney disease and is taking calcium daily.  She did miss a couple of days of her calcium.  She has been experiencing some increased tingling and cramping and was concerned about her calcium level.  She is taking all of her medications daily.    Medications and allergies reviewed with patient and updated if appropriate.  Patient Active Problem List   Diagnosis Date Noted  . Cellulitis and abscess of trunk 12/01/2017  . Tingling of left upper extremity 10/25/2017  . Impaired regulation of body temperature 10/25/2017  . Vitamin D deficiency 05/09/2017  . CKD (chronic kidney disease) 10/24/2016  . Normocytic anemia 10/24/2016  . Periodic limb movement 10/09/2016  . T12 burst fracture ([place]) 08/05/2016  . OSA on CPAP 04/01/2016  . Insomnia 01/12/2016  . Chronic pain syndrome 01/12/2016  . Joint pain 01/12/2016  . Numbness and tingling 01/12/2016  . Chronic fatigue 01/12/2016  . Depression 01/12/2016  . Neck pain 06/10/2015  . Bilateral calf pain 03/05/2015  . Fibromyalgia 03/04/2015  . Postprocedural hypothyroidism 03/04/2015  . Postprocedural hypoparathyroidism ([place]) 03/04/2015  . Sjogren's syndrome ([place]) 11/01/2012  . Headache(784.0) 05/21/2010  . Chronic constipation 05/14/2009  . THYROID NODULE 03/10/2009  . PALPITATIONS 03/03/2009    Current Outpatient Medications on File Prior to Visit  Medication Sig Dispense Refill  . baclofen  (LIORESAL) 10 MG tablet Take 10 mg by mouth as needed for muscle spasms.    . Biotin 5000 MCG TABS Take 1 tablet by mouth daily.    . calcitRIOL (ROCALTROL) 0.5 MCG capsule Take twice daily 60 capsule 6  . cycloSPORINE (RESTASIS) 0.05 % ophthalmic emulsion Place 1 drop into both eyes 2 (two) times daily.    . eszopiclone (LUNESTA) 2 MG TABS tablet Take 1 tablet (2 mg total) by mouth at bedtime as needed for sleep. Take immediately before bedtime 30 tablet 2  . gabapentin (NEURONTIN) 300 MG capsule Take 2 capsules (600 mg total) by mouth 2 (two) times daily. 90 capsule 0  . hydroxychloroquine (PLAQUENIL) 200 MG tablet Take 400 mg by mouth daily.     Marland Kitchen levothyroxine (SYNTHROID, LEVOTHROID) 100 MCG tablet Take 1 tablet (100 mcg total) by mouth daily. 30 tablet 3  . linaclotide (LINZESS) 145 MCG CAPS capsule Take 1 capsule (145 mcg total) daily before breakfast by mouth. 30 capsule 5  . metoCLOPramide (REGLAN) 10 MG tablet Take 10 mg by mouth every 6 (six) hours as needed for nausea or vomiting.     . ondansetron (ZOFRAN) 4 MG tablet Take 1 tablet (4 mg total) by mouth every 8 (eight) hours as needed for nausea or vomiting. 30 tablet 2  . pilocarpine (SALAGEN) 5 MG tablet TAKE 1 TABLET PO TWICE A DAY  3  . tiZANidine (ZANAFLEX) 2 MG tablet Take 1 tablet (2 mg total) by mouth  every 6 (six) hours as needed for muscle spasms. 60 tablet 2  . topiramate (TOPAMAX) 200 MG tablet Take 200 mg by mouth at bedtime.     . traMADol (ULTRAM) 50 MG tablet Take 2 tablets (100 mg total) by mouth every 12 (twelve) hours as needed. (Patient taking differently: Take 100 mg by mouth every 12 (twelve) hours as needed for moderate pain or severe pain. ) 120 tablet 0   No current facility-administered medications on file prior to visit.     Past Medical History:  Diagnosis Date  . Fibromyalgia   . Migraine    Dr Domingo Cocking  . Peripheral neuropathy   . Sjogren's syndrome ([place])    Dr Ouida Sills  . Thyroid nodule     Past  Surgical History:  Procedure Laterality Date  . Intrauterine Ablation  2011   Dr Raphael Gibney ( now seeing Dr Christophe Louis)  . lip biopsy  08/2012   Dr Thea Gist  . MANDIBLE SURGERY     TMJ   . THYROIDECTOMY  06/2009   benign nodule  . TUBAL LIGATION      Social History   Socioeconomic History  . Marital status: Widowed    Spouse name: Not on file  . Number of children: 2  . Years of education: 88  . Highest education level: Not on file  Occupational History  . Occupation: Product Compliance Specialist  Social Needs  . Financial resource strain: Not on file  . Food insecurity:    Worry: Not on file    Inability: Not on file  . Transportation needs:    Medical: Not on file    Non-medical: Not on file  Tobacco Use  . Smoking status: Never Smoker  . Smokeless tobacco: Never Used  Substance and Sexual Activity  . Alcohol use: Yes    Alcohol/week: 1.8 oz    Types: 3 Shots of liquor per week    Comment:  3 shots of liquor per week  . Drug use: No  . Sexual activity: Not on file  Lifestyle  . Physical activity:    Days per week: Not on file    Minutes per session: Not on file  . Stress: Not on file  Relationships  . Social connections:    Talks on phone: Not on file    Gets together: Not on file    Attends religious service: Not on file    Active member of club or organization: Not on file    Attends meetings of clubs or organizations: Not on file    Relationship status: Not on file  Other Topics Concern  . Not on file  Social History Narrative   Lives at home with her daughter.   Right-handed.   One cup caffeine per day.    Family History  Problem Relation Age of Onset  . AAA (abdominal aortic aneurysm) Father   . Stroke Mother 64  . Diabetes Mother   . Kidney failure Mother   . Stroke Sister 76  . Hypertension Sister   . Heart attack Maternal Grandfather        ? age  . Heart attack Maternal Grandmother        ? age  . Diabetes Sister   . Hypertension  Sister   . Diabetes Brother   . Kidney failure Brother   . Cancer Neg Hx     Review of Systems  Constitutional: Negative for chills and fever.  Musculoskeletal:  Leg cramps  Skin: Positive for color change (redness around mole - sore, firm, larger).  Neurological: Positive for numbness (and tingling - arms).       Objective:   Vitals:   12/01/17 1422  BP: 114/68  Pulse: (!) 105  Resp: 16  Temp: 98.1 F (36.7 C)  SpO2: 94%   BP Readings from Last 3 Encounters:  12/01/17 114/68  11/24/17 128/78  10/25/17 96/68   Wt Readings from Last 3 Encounters:  12/01/17 193 lb (87.5 kg)  11/24/17 193 lb 3.2 oz (87.6 kg)  10/25/17 196 lb (88.9 kg)   Body mass index is 33.65 kg/m.   Physical Exam  Constitutional: She appears well-developed and well-nourished. No distress.  HENT:  Head: Normocephalic and atraumatic.  Skin: She is not diaphoretic.  Pencil eraser sized mole/tag with mild erythema and swelling around mole, firmness under mole sized of peanut that is tender           Assessment & Plan:    See Problem List for Assessment and Plan of chronic medical problems.

## 2017-12-01 ENCOUNTER — Ambulatory Visit (INDEPENDENT_AMBULATORY_CARE_PROVIDER_SITE_OTHER): Payer: 59 | Admitting: Internal Medicine

## 2017-12-01 ENCOUNTER — Other Ambulatory Visit (INDEPENDENT_AMBULATORY_CARE_PROVIDER_SITE_OTHER): Payer: 59

## 2017-12-01 VITALS — BP 114/68 | HR 105 | Temp 98.1°F | Resp 16 | Wt 193.0 lb

## 2017-12-01 DIAGNOSIS — L02219 Cutaneous abscess of trunk, unspecified: Secondary | ICD-10-CM | POA: Diagnosis not present

## 2017-12-01 DIAGNOSIS — N189 Chronic kidney disease, unspecified: Secondary | ICD-10-CM

## 2017-12-01 DIAGNOSIS — L03319 Cellulitis of trunk, unspecified: Secondary | ICD-10-CM

## 2017-12-01 DIAGNOSIS — R202 Paresthesia of skin: Secondary | ICD-10-CM

## 2017-12-01 LAB — BASIC METABOLIC PANEL
BUN: 33 mg/dL — ABNORMAL HIGH (ref 6–23)
CALCIUM: 9.3 mg/dL (ref 8.4–10.5)
CHLORIDE: 107 meq/L (ref 96–112)
CO2: 23 meq/L (ref 19–32)
Creatinine, Ser: 1.72 mg/dL — ABNORMAL HIGH (ref 0.40–1.20)
GFR: 39.44 mL/min — ABNORMAL LOW (ref >60.00)
Glucose, Bld: 85 mg/dL (ref 70–99)
Potassium: 4 mEq/L (ref 3.5–5.1)
Sodium: 139 mEq/L (ref 135–145)

## 2017-12-01 MED ORDER — CEPHALEXIN 500 MG PO CAPS: 500.0000 mg | ORAL_CAPSULE | Freq: Three times a day (TID) | ORAL | 0 refills | Status: DC

## 2017-12-01 NOTE — Patient Instructions (Addendum)
  Test(s) ordered today. Your results will be released to [place] (or called to you) after review, usually within 72hours after test completion. If any changes need to be made, you will be notified at that same time.   Medications reviewed and updated.  Changes include starting an antibiotic.  Your prescription(s) have been submitted to your pharmacy. Please take as directed and contact our office if you believe you are having problem(s) with the medication(s).  A referral was ordered for dermatology

## 2017-12-02 ENCOUNTER — Encounter: Payer: Self-pay | Admitting: Internal Medicine

## 2017-12-02 DIAGNOSIS — R202 Paresthesia of skin: Secondary | ICD-10-CM | POA: Insufficient documentation

## 2017-12-02 NOTE — Assessment & Plan Note (Signed)
Likely from cervical radiculopathy which she has, but did not take calcium a couple of days - will check cmp Continue daily vitamins - will increase if needed

## 2017-12-02 NOTE — Assessment & Plan Note (Signed)
cmp

## 2017-12-02 NOTE — Assessment & Plan Note (Signed)
Mild cellulitis of mole - there is a firmness that is tender under the mole - possible abscess Warm compresses Start keflex TID x 1 week  Will refer to derm for evaluation/removal of mole

## 2018-03-23 ENCOUNTER — Other Ambulatory Visit: Payer: Self-pay | Admitting: Endocrinology

## 2018-05-17 ENCOUNTER — Other Ambulatory Visit: Payer: Self-pay | Admitting: Orthopaedic Surgery

## 2018-05-17 DIAGNOSIS — M5030 Other cervical disc degeneration, unspecified cervical region: Secondary | ICD-10-CM

## 2018-05-23 DIAGNOSIS — Z0289 Encounter for other administrative examinations: Secondary | ICD-10-CM

## 2018-05-27 ENCOUNTER — Other Ambulatory Visit: Payer: 59

## 2018-06-03 ENCOUNTER — Ambulatory Visit
Admission: RE | Admit: 2018-06-03 | Discharge: 2018-06-03 | Disposition: A | Discharge status: Home / Self Care | Payer: BLUE CROSS/BLUE SHIELD | Source: Ambulatory Visit | Attending: Orthopaedic Surgery | Admitting: Orthopaedic Surgery

## 2018-06-03 DIAGNOSIS — M5030 Other cervical disc degeneration, unspecified cervical region: Secondary | ICD-10-CM

## 2018-06-06 ENCOUNTER — Other Ambulatory Visit: Payer: Self-pay | Admitting: Orthopaedic Surgery

## 2018-06-06 DIAGNOSIS — M546 Pain in thoracic spine: Secondary | ICD-10-CM

## 2018-06-15 ENCOUNTER — Ambulatory Visit
Admission: RE | Admit: 2018-06-15 | Discharge: 2018-06-15 | Disposition: A | Discharge status: Home / Self Care | Payer: BLUE CROSS/BLUE SHIELD | Source: Ambulatory Visit | Attending: Orthopaedic Surgery | Admitting: Orthopaedic Surgery

## 2018-06-15 DIAGNOSIS — M546 Pain in thoracic spine: Secondary | ICD-10-CM

## 2018-06-16 ENCOUNTER — Other Ambulatory Visit: Payer: BLUE CROSS/BLUE SHIELD

## 2018-06-18 ENCOUNTER — Ambulatory Visit
Admission: RE | Admit: 2018-06-18 | Discharge: 2018-06-18 | Disposition: A | Discharge status: Home / Self Care | Payer: BLUE CROSS/BLUE SHIELD | Source: Ambulatory Visit | Attending: Orthopaedic Surgery | Admitting: Orthopaedic Surgery

## 2018-06-18 ENCOUNTER — Other Ambulatory Visit: Payer: Self-pay | Admitting: Orthopaedic Surgery

## 2018-06-18 DIAGNOSIS — M546 Pain in thoracic spine: Secondary | ICD-10-CM

## 2018-06-20 ENCOUNTER — Other Ambulatory Visit: Payer: Self-pay | Admitting: Endocrinology

## 2018-06-29 ENCOUNTER — Other Ambulatory Visit: Payer: Self-pay | Admitting: Internal Medicine

## 2018-07-16 ENCOUNTER — Other Ambulatory Visit (HOSPITAL_COMMUNITY)
Admission: RE | Admit: 2018-07-16 | Discharge: 2018-07-16 | Disposition: A | Discharge status: Home / Self Care | Payer: BLUE CROSS/BLUE SHIELD | Source: Ambulatory Visit | Attending: Obstetrics and Gynecology | Admitting: Obstetrics and Gynecology

## 2018-07-16 ENCOUNTER — Other Ambulatory Visit: Payer: Self-pay | Admitting: Obstetrics and Gynecology

## 2018-07-16 DIAGNOSIS — Z01419 Encounter for gynecological examination (general) (routine) without abnormal findings: Secondary | ICD-10-CM | POA: Insufficient documentation

## 2018-07-18 LAB — CYTOLOGY - PAP
DIAGNOSIS: NEGATIVE
HPV 16/18/45 GENOTYPING: POSITIVE — AB
HPV: DETECTED — AB

## 2018-07-30 NOTE — Progress Notes (Signed)
Subjective:    Patient ID: Terri Roberts, female    DOB: Sep 22, 1960, 57 y.o.   MRN: [id number]  HPI The patient is here for an acute visit for anxiety.  Anxiety:  She is having a lot of anxiety.  At night it feels like her heart is going to beat out of her chest.  She is going through a lot.  She has chest pain in the middle of her chest.  It is a scary feeling.  It  Is unbearable.  She has the chest pain during the day at times.  Sometimes it feels like indigestion but it is not.    Depression:  She has a lot of depression and it is hard to get herself out of bed.  She is in a lot of pain.  Getting up and coming here is hard to do.  Some days she does not even brush her teeth.   She stopped working Feb 2019.  She applied for disability in late July and was denied.  She appealed and it is in reconsideration.  She spine and scoliosis doctor wrote a letter on her behalf.    There is something wrong with her eyes - she has blurry vision and she can not go to her eye doctor.   She has blurriness even with her glasses.    She can not concentrate or focus or sleep because of how she is feeling.    She has no insurance as of tomorrow.  The only way she will have insurance is if she gets SS disability.    Medications and allergies reviewed with patient and updated if appropriate.  Patient Active Problem List   Diagnosis Date Noted  . Tingling in extremities 12/02/2017  . Tingling of left upper extremity 10/25/2017  . Impaired regulation of body temperature 10/25/2017  . Vitamin D deficiency 05/09/2017  . CKD (chronic kidney disease) 10/24/2016  . Normocytic anemia 10/24/2016  . Periodic limb movement 10/09/2016  . T12 burst fracture ([place]) 08/05/2016  . OSA on CPAP 04/01/2016  . Insomnia 01/12/2016  . Chronic pain syndrome 01/12/2016  . Joint pain 01/12/2016  . Numbness and tingling 01/12/2016  . Chronic fatigue 01/12/2016  . Depression 01/12/2016  . Neck pain 06/10/2015  .  Bilateral calf pain 03/05/2015  . Fibromyalgia 03/04/2015  . Postprocedural hypothyroidism 03/04/2015  . Postprocedural hypoparathyroidism ([place]) 03/04/2015  . Sjogren's syndrome ([place]) 11/01/2012  . Headache(784.0) 05/21/2010  . Chronic constipation 05/14/2009  . THYROID NODULE 03/10/2009  . PALPITATIONS 03/03/2009    Current Outpatient Medications on File Prior to Visit  Medication Sig Dispense Refill  . baclofen (LIORESAL) 10 MG tablet Take 10 mg by mouth as needed for muscle spasms.    . Biotin 5000 MCG TABS Take 1 tablet by mouth daily.    . calcitRIOL (ROCALTROL) 0.5 MCG capsule TAKE TWICE DAILY 60 capsule 0  . cycloSPORINE (RESTASIS) 0.05 % ophthalmic emulsion Place 1 drop into both eyes 2 (two) times daily.    . hydroxychloroquine (PLAQUENIL) 200 MG tablet Take 400 mg by mouth daily.     . metoCLOPramide (REGLAN) 10 MG tablet Take 10 mg by mouth every 6 (six) hours as needed for nausea or vomiting.     . ondansetron (ZOFRAN) 4 MG tablet TAKE 1 TABLET BY MOUTH EVERY 8 HOURS AS NEEDED FOR NAUSEA AND VOMITING 30 tablet 0  . pilocarpine (SALAGEN) 5 MG tablet TAKE 1 TABLET PO TWICE A DAY  3  .  pregabalin (LYRICA) 75 MG capsule 75 mg 2 (two) times daily.     Marland Kitchen SYNTHROID 100 MCG tablet TAKE 1 TABLET BY MOUTH EVERY DAY 30 tablet 3  . tiZANidine (ZANAFLEX) 2 MG tablet Take 1 tablet (2 mg total) by mouth every 6 (six) hours as needed for muscle spasms. 60 tablet 2  . topiramate (TOPAMAX) 200 MG tablet Take 200 mg by mouth at bedtime.     . traMADol (ULTRAM) 50 MG tablet tramadol 50 mg tablet   100 mg by oral route.     No current facility-administered medications on file prior to visit.     Past Medical History:  Diagnosis Date  . Fibromyalgia   . Migraine    Dr Domingo Cocking  . Peripheral neuropathy   . Sjogren's syndrome ([place])    Dr Ouida Sills  . Thyroid nodule     Past Surgical History:  Procedure Laterality Date  . Intrauterine Ablation  2011   Dr Raphael Gibney ( now seeing Dr Christophe Louis)  . lip biopsy  08/2012   Dr Thea Gist  . MANDIBLE SURGERY     TMJ   . THYROIDECTOMY  06/2009   benign nodule  . TUBAL LIGATION      Social History   Socioeconomic History  . Marital status: Widowed    Spouse name: Not on file  . Number of children: 2  . Years of education: 80  . Highest education level: Not on file  Occupational History  . Occupation: Product Compliance Specialist  Social Needs  . Financial resource strain: Not on file  . Food insecurity:    Worry: Not on file    Inability: Not on file  . Transportation needs:    Medical: Not on file    Non-medical: Not on file  Tobacco Use  . Smoking status: Never Smoker  . Smokeless tobacco: Never Used  Substance and Sexual Activity  . Alcohol use: Yes    Alcohol/week: 3.0 standard drinks    Types: 3 Shots of liquor per week    Comment:  3 shots of liquor per week  . Drug use: No  . Sexual activity: Not on file  Lifestyle  . Physical activity:    Days per week: Not on file    Minutes per session: Not on file  . Stress: Not on file  Relationships  . Social connections:    Talks on phone: Not on file    Gets together: Not on file    Attends religious service: Not on file    Active member of club or organization: Not on file    Attends meetings of clubs or organizations: Not on file    Relationship status: Not on file  Other Topics Concern  . Not on file  Social History Narrative   Lives at home with her daughter.   Right-handed.   One cup caffeine per day.    Family History  Problem Relation Age of Onset  . AAA (abdominal aortic aneurysm) Father   . Stroke Mother 37  . Diabetes Mother   . Kidney failure Mother   . Stroke Sister 70  . Hypertension Sister   . Heart attack Maternal Grandfather        ? age  . Heart attack Maternal Grandmother        ? age  . Diabetes Sister   . Hypertension Sister   . Diabetes Brother   . Kidney failure Brother   . Cancer Neg Hx  Review of Systems    Cardiovascular: Positive for chest pain and palpitations.  Psychiatric/Behavioral: Positive for decreased concentration, dysphoric mood and sleep disturbance. The patient is nervous/anxious.        Objective:   Vitals:   07/31/18 1140  BP: 130/82  Pulse: 78  Resp: 18  Temp: 98.3 F (36.8 C)  SpO2: 98%   BP Readings from Last 3 Encounters:  07/31/18 130/82  12/01/17 114/68  11/24/17 128/78   Wt Readings from Last 3 Encounters:  07/31/18 195 lb 12.8 oz (88.8 kg)  12/01/17 193 lb (87.5 kg)  11/24/17 193 lb 3.2 oz (87.6 kg)   Body mass index is 34.14 kg/m.   Physical Exam Constitutional:      General: She is not in acute distress (uncomfortable with chronic pain).    Appearance: Normal appearance. She is not ill-appearing.  HENT:     Head: Normocephalic and atraumatic.  Neurological:     Mental Status: She is alert.  Psychiatric:        Behavior: Behavior normal.        Thought Content: Thought content normal.        Judgment: Judgment normal.     Comments: Anxious, depressed, crying throughout most of the visit            Assessment & Plan:    She has multiple medical problems, most significantly chronic pain from fibromyalgia and spinal stenosis.  These conditions are debilitating.  She also is experiencing severe anxiety and depression related to multiple things.  With all these problems she would have difficulty working.   See Problem List for Assessment and Plan of chronic medical problems.

## 2018-07-31 ENCOUNTER — Ambulatory Visit: Payer: BLUE CROSS/BLUE SHIELD | Admitting: Internal Medicine

## 2018-07-31 ENCOUNTER — Encounter: Payer: Self-pay | Admitting: Internal Medicine

## 2018-07-31 DIAGNOSIS — F419 Anxiety disorder, unspecified: Secondary | ICD-10-CM | POA: Diagnosis not present

## 2018-07-31 DIAGNOSIS — M797 Fibromyalgia: Secondary | ICD-10-CM

## 2018-07-31 DIAGNOSIS — F3289 Other specified depressive episodes: Secondary | ICD-10-CM

## 2018-07-31 NOTE — Patient Instructions (Addendum)
   Medications reviewed and updated.  Changes include :   Start hydroxyzine as needed for anxiety.   Your prescription(s) have been submitted to your pharmacy. Please take as directed and contact our office if you believe you are having problem(s) with the medication(s).

## 2018-08-01 DIAGNOSIS — F419 Anxiety disorder, unspecified: Secondary | ICD-10-CM | POA: Insufficient documentation

## 2018-08-01 DIAGNOSIS — M4800 Spinal stenosis, site unspecified: Secondary | ICD-10-CM | POA: Insufficient documentation

## 2018-08-01 MED ORDER — ESZOPICLONE 2 MG PO TABS: 2.0000 mg | ORAL_TABLET | Freq: Every evening | ORAL | 2 refills | Status: DC | PRN

## 2018-08-01 MED ORDER — HYDROXYZINE PAMOATE 50 MG PO CAPS: 50.0000 mg | ORAL_CAPSULE | Freq: Three times a day (TID) | ORAL | 0 refills | Status: DC | PRN

## 2018-08-01 MED ORDER — GABAPENTIN 300 MG PO CAPS: 600.0000 mg | ORAL_CAPSULE | Freq: Two times a day (BID) | ORAL | 0 refills | Status: DC

## 2018-08-01 MED ORDER — LINACLOTIDE 145 MCG PO CAPS: 145.0000 ug | ORAL_CAPSULE | Freq: Every day | ORAL | 5 refills | Status: DC

## 2018-08-01 NOTE — Assessment & Plan Note (Signed)
Severe depression related to chronic pain and current situation Limited with medication and now lack of insurance-unable to take an SSRI given that she is on tramadol Should ideally see psychiatry again-has no insurance as of tomorrow Given current degree of depression it would be difficult for her to work

## 2018-08-01 NOTE — Assessment & Plan Note (Signed)
Experiencing severe anxiety at this time related to chronic pain financial issues and lack of insurance as of tomorrow Experiencing chest pain as a result of severe anxiety Unable to take an SSRI secondary to being on tramadol Would like to avoid benzodiazepines Start hydroxyzine, which she has been in the past-discussed this will only help with the anxiety and not the depression Needs to follow-up with her current medications-hopefully she will have insurance in the near future

## 2018-08-01 NOTE — Assessment & Plan Note (Signed)
Chronic pain secondary to fibromyalgia Following with rheumatology Taking tramadol, Lyrica, baclofen

## 2018-08-20 ENCOUNTER — Other Ambulatory Visit: Payer: Self-pay | Admitting: Obstetrics and Gynecology

## 2018-08-21 ENCOUNTER — Other Ambulatory Visit: Payer: Self-pay | Admitting: Internal Medicine

## 2018-08-29 ENCOUNTER — Other Ambulatory Visit: Payer: Self-pay | Admitting: Internal Medicine

## 2018-09-03 ENCOUNTER — Other Ambulatory Visit: Payer: Self-pay | Admitting: Endocrinology

## 2018-09-20 ENCOUNTER — Telehealth: Payer: Self-pay | Admitting: Endocrinology

## 2018-09-20 ENCOUNTER — Encounter: Payer: Self-pay | Admitting: Endocrinology

## 2018-09-20 NOTE — Telephone Encounter (Signed)
Unable to reach patient by phone, sending letter to try and schedule a lab and Dr Dwyane Dee appointment

## 2018-09-29 ENCOUNTER — Other Ambulatory Visit: Payer: Self-pay | Admitting: Endocrinology

## 2018-11-27 ENCOUNTER — Telehealth: Payer: Self-pay

## 2018-11-27 NOTE — Telephone Encounter (Signed)
Schedule Virtual

## 2018-11-27 NOTE — Telephone Encounter (Signed)
Copied from [place] [id number]. Topic: Referral - Request for Referral >> Nov 27, 2018  3:47 PM Marin Olp L wrote: Has patient seen PCP for this complaint? yes *If NO, is insurance requiring patient see PCP for this issue before PCP can refer them? no Referral for which specialty: orthopaedic Preferred provider/office: [facility] Reason for referral: left knee pain, had surgery on it in 2012  Patient only has [employer] now. Please advise patient if referral can still be placed.

## 2018-11-27 NOTE — Telephone Encounter (Signed)
Virtual needed? 

## 2018-11-28 ENCOUNTER — Ambulatory Visit (INDEPENDENT_AMBULATORY_CARE_PROVIDER_SITE_OTHER): Payer: Medicaid Other | Admitting: Internal Medicine

## 2018-11-28 ENCOUNTER — Encounter: Payer: Self-pay | Admitting: Internal Medicine

## 2018-11-28 DIAGNOSIS — M25562 Pain in left knee: Secondary | ICD-10-CM | POA: Diagnosis not present

## 2018-11-28 DIAGNOSIS — G8929 Other chronic pain: Secondary | ICD-10-CM

## 2018-11-28 MED ORDER — DICLOFENAC SODIUM 1 % TD GEL: 4.0000 g | Freq: Four times a day (QID) | TRANSDERMAL | 5 refills | Status: DC

## 2018-11-28 NOTE — Progress Notes (Signed)
Virtual Visit via Video Note  I connected with Terri Roberts on 11/28/18 at  3:45 PM EDT by a video enabled telemedicine application and verified that I am speaking with the correct person using two identifiers.   I discussed the limitations of evaluation and management by telemedicine and the availability of in person appointments. The patient expressed understanding and agreed to proceed.  The patient is currently at home and I am in the office.    No referring provider.    History of Present Illness: This visit is for left knee pain.    She has left knee pain.  She had arthroscopic surgery on the knee in 2012 for a meniscus tear, loose cartilage and arthritis.  She has constant pain.  She has pain with walking and even has pain with sleeping.  The knee also feels very hypersensitive.  She is interested in seeing orthopedics again to have this reevaluated.  She is taking tramadol daily for her fibromyalgia pain as well as gabapentin.  She feels this does not help much with the pain.  She would like to go to [employer].     Social History   Socioeconomic History  . Marital status: Widowed    Spouse name: Not on file  . Number of children: 2  . Years of education: 23  . Highest education level: Not on file  Occupational History  . Occupation: Product Compliance Specialist  Social Needs  . Financial resource strain: Not on file  . Food insecurity:    Worry: Not on file    Inability: Not on file  . Transportation needs:    Medical: Not on file    Non-medical: Not on file  Tobacco Use  . Smoking status: Never Smoker  . Smokeless tobacco: Never Used  Substance and Sexual Activity  . Alcohol use: Yes    Alcohol/week: 3.0 standard drinks    Types: 3 Shots of liquor per week    Comment:  3 shots of liquor per week  . Drug use: No  . Sexual activity: Not on file  Lifestyle  . Physical activity:    Days per week: Not on file    Minutes per session: Not on file   . Stress: Not on file  Relationships  . Social connections:    Talks on phone: Not on file    Gets together: Not on file    Attends religious service: Not on file    Active member of club or organization: Not on file    Attends meetings of clubs or organizations: Not on file    Relationship status: Not on file  Other Topics Concern  . Not on file  Social History Narrative   Lives at home with her daughter.   Right-handed.   One cup caffeine per day.     Observations/Objective: Appears well in NAD   Assessment and Plan:  See Problem List for Assessment and Plan of chronic medical problems.   Follow Up Instructions:    I discussed the assessment and treatment plan with the patient. The patient was provided an opportunity to ask questions and all were answered. The patient agreed with the plan and demonstrated an understanding of the instructions.   The patient was advised to call back or seek an in-person evaluation if the symptoms worsen or if the condition fails to improve as anticipated.    [name], MD

## 2018-11-28 NOTE — Assessment & Plan Note (Signed)
Chronic left knee pain Had arthroscopic surgery in 2012 Has constant pain now-with walking and even with sleep Tramadol, gabapentin not effective She does apply heat and that does help Can try very small amounts of Voltaren gel-asked to also discuss with her kidney doctor given her chronic kidney disease since I am not sure if she will be able to be seen soon by orthopedics Will refer to [facility] orthopedic

## 2018-11-28 NOTE — Telephone Encounter (Signed)
Appointment made

## 2019-03-22 ENCOUNTER — Telehealth: Payer: Self-pay | Admitting: Internal Medicine

## 2019-03-22 NOTE — Telephone Encounter (Signed)
Pt called and stated that she would like a call back from the nurse. Pt states that she was seen in the er in may. Pt states that she just got a letter that states that she needs to follow up with the primary care doctor. Pt would not give me further detail.

## 2019-03-23 ENCOUNTER — Encounter: Payer: Self-pay | Admitting: Internal Medicine

## 2019-03-23 DIAGNOSIS — Q283 Other malformations of cerebral vessels: Secondary | ICD-10-CM

## 2019-03-25 NOTE — Telephone Encounter (Signed)
Called pt. Response attached to my chart message in regards to the same issue.

## 2019-03-25 NOTE — Addendum Note (Signed)
Addended by: [name] on: 03/25/2019 09:39 PM   Modules accepted: Orders

## 2019-03-25 NOTE — Telephone Encounter (Addendum)
Spoke with pt and she is concerned about the note she sent you through my chart. She is wanting to know if something is wrong with her and states to send her a message through my chart in response to the one that she sent. The note apparently states you received the same note and she is wondering why nobody reached out to her. Pt would like to have a response as soon as possible.

## 2019-04-17 ENCOUNTER — Telehealth: Payer: Self-pay | Admitting: *Deleted

## 2019-04-17 ENCOUNTER — Ambulatory Visit: Payer: Medicaid Other | Admitting: Neurology

## 2019-04-17 ENCOUNTER — Other Ambulatory Visit: Payer: Self-pay

## 2019-04-17 ENCOUNTER — Encounter: Payer: Self-pay | Admitting: Neurology

## 2019-04-17 VITALS — BP 129/83 | HR 79 | Temp 97.5°F | Ht 63.5 in | Wt 185.0 lb

## 2019-04-17 DIAGNOSIS — M545 Low back pain, unspecified: Secondary | ICD-10-CM | POA: Insufficient documentation

## 2019-04-17 DIAGNOSIS — G43909 Migraine, unspecified, not intractable, without status migrainosus: Secondary | ICD-10-CM | POA: Insufficient documentation

## 2019-04-17 DIAGNOSIS — IMO0002 Reserved for concepts with insufficient information to code with codable children: Secondary | ICD-10-CM

## 2019-04-17 DIAGNOSIS — G43709 Chronic migraine without aura, not intractable, without status migrainosus: Secondary | ICD-10-CM | POA: Diagnosis not present

## 2019-04-17 MED ORDER — AJOVY 225 MG/1.5ML ~~LOC~~ SOSY: 225.0000 mg | PREFILLED_SYRINGE | SUBCUTANEOUS | 11 refills | Status: DC

## 2019-04-17 MED ORDER — TIZANIDINE HCL 4 MG PO TABS: 4.0000 mg | ORAL_TABLET | Freq: Four times a day (QID) | ORAL | 6 refills | Status: DC | PRN
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
## 2019-04-17 NOTE — Telephone Encounter (Signed)
PA for Ajovy approved by [facility] Medicaid.  Valid through 07/16/2019.  [id number].  Pt [id number] M.

## 2019-04-18 ENCOUNTER — Telehealth: Payer: Self-pay | Admitting: Neurology

## 2019-04-18 ENCOUNTER — Institutional Professional Consult (permissible substitution): Payer: 59 | Admitting: Neurology

## 2019-04-18 LAB — COMPREHENSIVE METABOLIC PANEL
ALT: 21 IU/L (ref 0–32)
AST: 28 IU/L (ref 0–40)
Albumin/Globulin Ratio: 1.6 (ref 1.2–2.2)
Albumin: 4.4 g/dL (ref 3.8–4.9)
Alkaline Phosphatase: 47 IU/L (ref 39–117)
BUN/Creatinine Ratio: 15 (ref 9–23)
BUN: 34 mg/dL — ABNORMAL HIGH (ref 6–24)
Bilirubin Total: 0.2 mg/dL (ref 0.0–1.2)
CO2: 19 mmol/L — ABNORMAL LOW (ref 20–29)
Calcium: 8 mg/dL — ABNORMAL LOW (ref 8.7–10.2)
Chloride: 108 mmol/L — ABNORMAL HIGH (ref 96–106)
Creatinine, Ser: 2.24 mg/dL — ABNORMAL HIGH (ref 0.57–1.00)
GFR calc Af Amer: 27 mL/min/{1.73_m2} — ABNORMAL LOW (ref >59)
GFR calc non Af Amer: 24 mL/min/{1.73_m2} — ABNORMAL LOW (ref >59)
Globulin, Total: 2.8 g/dL (ref 1.5–4.5)
Glucose: 85 mg/dL (ref 65–99)
Potassium: 4 mmol/L (ref 3.5–5.2)
Sodium: 141 mmol/L (ref 134–144)
Total Protein: 7.2 g/dL (ref 6.0–8.5)

## 2019-04-18 LAB — ANA W/REFLEX IF POSITIVE
Anti JO-1: <0.2 AI (ref 0.0–0.9)
Anti Nuclear Antibody (ANA): POSITIVE — AB
Centromere Ab Screen: <0.2 AI (ref 0.0–0.9)
Chromatin Ab SerPl-aCnc: <0.2 AI (ref 0.0–0.9)
ENA RNP Ab: <0.2 AI (ref 0.0–0.9)
ENA SM Ab Ser-aCnc: <0.2 AI (ref 0.0–0.9)
ENA SSA (RO) Ab: 4.8 AI — ABNORMAL HIGH (ref 0.0–0.9)
ENA SSB (LA) Ab: <0.2 AI (ref 0.0–0.9)
Scleroderma (Scl-70) (ENA) Antibody, IgG: <0.2 AI (ref 0.0–0.9)
dsDNA Ab: 1 IU/mL (ref 0–9)

## 2019-04-18 LAB — SEDIMENTATION RATE: Sed Rate: 18 mm/hr (ref 0–40)

## 2019-04-18 LAB — C-REACTIVE PROTEIN: CRP: 6 mg/L (ref 0–10)

## 2019-04-18 NOTE — Telephone Encounter (Signed)
Please call patient, laboratory evaluation showed positive ANA, SSA antibody consistent with her diagnosis of Sjogren's disease, there is evidence of worsening kidney function, with estimated GFR of 27,  I have forwarded the laboratory evaluation to her primary care physician Dr. Billey Gosling, she should contact her for further evaluation

## 2019-04-18 NOTE — Telephone Encounter (Signed)
I spoke to the patient and she verbalized understanding of her results.  She is established with nephrology and will schedule a follow up.

## 2019-05-07 ENCOUNTER — Telehealth: Payer: Self-pay | Admitting: Neurology

## 2019-05-07 NOTE — Telephone Encounter (Signed)
Patient called in stating she would like to be seen for her Neuropathy, pt is currently being seen for Chronic Migraines ( per referrals) . Advised pt she would need a new referral to be seen for Neuropathy. Advised pt she would need a new referral in order to be seen for Neuropathy due to doctors specializing in different area of Neurology . Pt then stated that was "stupid" and she was just seen in out office by Dr Krista Blue I advised pt she was seen for her Migraines not Neuropathy. She then stated that didn't make since and she doesn't understand why. Patient then hung up the phone while hanging up patient called me "stupid"

## 2019-05-07 NOTE — Telephone Encounter (Signed)
I returned the call to the patient to explain why this type of policy is in place.  We want to ensure that patients are seen by the appropriate specialist and often times this is initially determined by primary care physicians. She verbalized understanding and apologized for loss of temper.    However, in her case, she has been evaluated by Dr. Krista Blue for peripheral neuropathy and migraines.  I offered to schedule an appt for her. States she has already contacted her spinal surgeon, Dr. Patrice Paradise, and he has agreed to see her.  She will contact our office, if he feels she still needs to been seen by neurology.

## 2019-05-22 ENCOUNTER — Other Ambulatory Visit: Payer: Self-pay | Admitting: Orthopaedic Surgery

## 2019-05-22 DIAGNOSIS — M546 Pain in thoracic spine: Secondary | ICD-10-CM

## 2019-05-27 ENCOUNTER — Other Ambulatory Visit: Payer: Self-pay

## 2019-05-27 ENCOUNTER — Ambulatory Visit
Admission: RE | Admit: 2019-05-27 | Discharge: 2019-05-27 | Disposition: A | Discharge status: Home / Self Care | Payer: Medicaid Other | Source: Ambulatory Visit | Attending: Orthopaedic Surgery | Admitting: Orthopaedic Surgery

## 2019-05-27 DIAGNOSIS — M546 Pain in thoracic spine: Secondary | ICD-10-CM

## 2019-07-19 ENCOUNTER — Ambulatory Visit: Payer: Medicaid Other | Admitting: Internal Medicine

## 2019-07-23 ENCOUNTER — Ambulatory Visit: Payer: Medicaid Other | Admitting: Internal Medicine

## 2019-07-23 NOTE — Progress Notes (Signed)
Virtual Visit via Video Note  I connected with Terri Roberts on 07/24/19 at  1:30 PM EST by a video enabled telemedicine application and verified that I am speaking with the correct person using two identifiers.   I discussed the limitations of evaluation and management by telemedicine and the availability of in person appointments. The patient expressed understanding and agreed to proceed.  Present for the visit:  Myself, Dr Billey Gosling, Marylynn Pearson.  The patient is currently at home and I am in the office.    No referring provider.    History of Present Illness: This visit is for surgical clearance at the request of Dr Patrice Paradise for back surgery for probable removal of existing hardware and revision of fusion with possible placement of new hardware.  Surgery scheduled for TBD.   She denies any personal or family history of problems with anesthesia or bleeding/blood clot problems.    She has no concerns.  She is taking all his medication as prescribed.   She is not exercising regularly.  With her daily activities she denies chest pain, palpitations, SOB and lightheadedness.     Review of Systems  Constitutional: Negative for chills and fever.  Respiratory: Negative for cough, shortness of breath and wheezing.   Cardiovascular: Negative for chest pain, palpitations and leg swelling.  Gastrointestinal: Positive for nausea (chronic). Negative for abdominal pain, constipation, diarrhea and vomiting.       GERD  Genitourinary: Negative for dysuria, frequency and hematuria.  Neurological: Positive for headaches. Negative for dizziness.     Social History   Socioeconomic History  . Marital status: Widowed    Spouse name: Not on file  . Number of children: 2  . Years of education: 71  . Highest education level: Not on file  Occupational History  . Occupation: Product Compliance Specialist  Tobacco Use  . Smoking status: Never Smoker  . Smokeless tobacco: Never Used  Substance and  Sexual Activity  . Alcohol use: Yes    Alcohol/week: 3.0 standard drinks    Types: 3 Shots of liquor per week    Comment:  3 shots of liquor per week  . Drug use: No  . Sexual activity: Not on file  Other Topics Concern  . Not on file  Social History Narrative   Lives at home with her daughter.   Right-handed.   One cup caffeine per day.   Social Determinants of Health   Financial Resource Strain:   . Difficulty of Paying Living Expenses: Not on file  Food Insecurity:   . Worried About Charity fundraiser in the Last Year: Not on file  . Ran Out of Food in the Last Year: Not on file  Transportation Needs:   . Lack of Transportation (Medical): Not on file  . Lack of Transportation (Non-Medical): Not on file  Physical Activity:   . Days of Exercise per Week: Not on file  . Minutes of Exercise per Session: Not on file  Stress:   . Feeling of Stress : Not on file  Social Connections:   . Frequency of Communication with Friends and Family: Not on file  . Frequency of Social Gatherings with Friends and Family: Not on file  . Attends Religious Services: Not on file  . Active Member of Clubs or Organizations: Not on file  . Attends Archivist Meetings: Not on file  . Marital Status: Not on file     Observations/Objective: Physical exam limited by telemedicine  visit Appears well in NAD Breathing normally Skin appears warm and dry Mood and affect normal  Assessment and Plan:  See Problem List for Assessment and Plan of chronic medical problems.   Follow Up Instructions:    I discussed the assessment and treatment plan with the patient. The patient was provided an opportunity to ask questions and all were answered. The patient agreed with the plan and demonstrated an understanding of the instructions.       [name], MD

## 2019-07-24 ENCOUNTER — Encounter: Payer: Self-pay | Admitting: Internal Medicine

## 2019-07-24 ENCOUNTER — Ambulatory Visit (INDEPENDENT_AMBULATORY_CARE_PROVIDER_SITE_OTHER): Payer: Medicaid Other | Admitting: Internal Medicine

## 2019-07-24 DIAGNOSIS — M797 Fibromyalgia: Secondary | ICD-10-CM

## 2019-07-24 DIAGNOSIS — E890 Postprocedural hypothyroidism: Secondary | ICD-10-CM

## 2019-07-24 DIAGNOSIS — G43709 Chronic migraine without aura, not intractable, without status migrainosus: Secondary | ICD-10-CM | POA: Diagnosis not present

## 2019-07-24 DIAGNOSIS — N189 Chronic kidney disease, unspecified: Secondary | ICD-10-CM

## 2019-07-24 DIAGNOSIS — M549 Dorsalgia, unspecified: Secondary | ICD-10-CM | POA: Insufficient documentation

## 2019-07-24 DIAGNOSIS — IMO0002 Reserved for concepts with insufficient information to code with codable children: Secondary | ICD-10-CM

## 2019-07-24 DIAGNOSIS — Z01818 Encounter for other preprocedural examination: Secondary | ICD-10-CM | POA: Diagnosis not present

## 2019-07-24 DIAGNOSIS — M546 Pain in thoracic spine: Secondary | ICD-10-CM

## 2019-07-24 DIAGNOSIS — G8929 Other chronic pain: Secondary | ICD-10-CM

## 2019-07-24 DIAGNOSIS — K219 Gastro-esophageal reflux disease without esophagitis: Secondary | ICD-10-CM

## 2019-07-24 DIAGNOSIS — M3500 Sicca syndrome, unspecified: Secondary | ICD-10-CM

## 2019-07-24 MED ORDER — OMEPRAZOLE 20 MG PO CPDR: 20.0000 mg | DELAYED_RELEASE_CAPSULE | Freq: Every day | ORAL | 3 refills | Status: DC

## 2019-07-24 NOTE — Assessment & Plan Note (Signed)
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 Joelyn Oms

## 2019-07-24 NOTE — Assessment & Plan Note (Addendum)
Following with neurology 
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
## 2019-07-24 NOTE — Assessment & Plan Note (Signed)
Lab Results  Component Value Date   TSH 0.04 (L) 11/22/2017   Following with Dr. Larose Kells management per him

## 2019-07-24 NOTE — Assessment & Plan Note (Signed)
Having frequent GERD and nausea Has been taking Tums and Zofran as needed Start omeprazole 20 mg daily

## 2019-07-24 NOTE — Assessment & Plan Note (Signed)
Following with Dr. Amil Amen

## 2019-07-24 NOTE — Assessment & Plan Note (Signed)
Chronic fibromyalgia pain Following with Dr. Amil Amen Taking gabapentin, Lyrica, tramadol

## 2019-07-24 NOTE — Assessment & Plan Note (Signed)
Multiple medical problems-currently stable Medically stable for surgery Clearance was also requested from endocrine, nephrology and rheumatology No concerning symptoms suggestive of cardiopulmonary disease Not on any NSAIDs or anticoagulants Overall risk is low Cleared for surgery

## 2019-09-10 ENCOUNTER — Other Ambulatory Visit: Payer: Self-pay | Admitting: Obstetrics and Gynecology

## 2020-03-19 DIAGNOSIS — N189 Chronic kidney disease, unspecified: Secondary | ICD-10-CM | POA: Diagnosis not present

## 2020-03-19 DIAGNOSIS — E890 Postprocedural hypothyroidism: Secondary | ICD-10-CM | POA: Diagnosis not present

## 2020-03-19 DIAGNOSIS — R87810 Cervical high risk human papillomavirus (HPV) DNA test positive: Secondary | ICD-10-CM | POA: Diagnosis not present

## 2020-03-19 DIAGNOSIS — B977 Papillomavirus as the cause of diseases classified elsewhere: Secondary | ICD-10-CM | POA: Diagnosis not present

## 2020-03-19 DIAGNOSIS — Z79890 Hormone replacement therapy (postmenopausal): Secondary | ICD-10-CM | POA: Diagnosis not present

## 2020-03-19 DIAGNOSIS — E209 Hypoparathyroidism, unspecified: Secondary | ICD-10-CM | POA: Diagnosis not present

## 2020-03-19 DIAGNOSIS — A630 Anogenital (venereal) warts: Secondary | ICD-10-CM | POA: Diagnosis not present

## 2020-03-24 DIAGNOSIS — Z135 Encounter for screening for eye and ear disorders: Secondary | ICD-10-CM | POA: Diagnosis not present

## 2020-03-24 DIAGNOSIS — H52229 Regular astigmatism, unspecified eye: Secondary | ICD-10-CM | POA: Diagnosis not present

## 2020-03-25 DIAGNOSIS — M7918 Myalgia, other site: Secondary | ICD-10-CM | POA: Diagnosis not present

## 2020-03-25 DIAGNOSIS — Z79891 Long term (current) use of opiate analgesic: Secondary | ICD-10-CM | POA: Diagnosis not present

## 2020-03-25 DIAGNOSIS — G894 Chronic pain syndrome: Secondary | ICD-10-CM | POA: Diagnosis not present

## 2020-03-25 DIAGNOSIS — Z79899 Other long term (current) drug therapy: Secondary | ICD-10-CM | POA: Diagnosis not present

## 2020-03-25 DIAGNOSIS — M546 Pain in thoracic spine: Secondary | ICD-10-CM | POA: Diagnosis not present

## 2020-03-25 DIAGNOSIS — M47894 Other spondylosis, thoracic region: Secondary | ICD-10-CM | POA: Diagnosis not present

## 2020-03-25 DIAGNOSIS — Z6832 Body mass index (BMI) 32.0-32.9, adult: Secondary | ICD-10-CM | POA: Diagnosis not present

## 2020-03-25 DIAGNOSIS — M4325 Fusion of spine, thoracolumbar region: Secondary | ICD-10-CM | POA: Diagnosis not present

## 2020-04-17 DIAGNOSIS — Z0100 Encounter for examination of eyes and vision without abnormal findings: Secondary | ICD-10-CM | POA: Diagnosis not present

## 2020-04-19 NOTE — Progress Notes (Signed)
Subjective:    Patient ID: Terri Roberts, female    DOB: 1961/01/03, 59 y.o.   MRN: [id number]  HPI The patient is here for an acute visit for weird sensation in her right shoulder to face.   This is new in the past three week.  She can be sitting here and she experiences a skin tightening sensation from her right upper arm to her right cheek.  It feels like someone is pulling her skin.  There is no pain.  At times there might be a tingle, but mostly just a tightening.  During our visit this occurred several times.  It comes and goes.  She denies any relation to change in position or activity.  She feels this every day.  It has increased in frequency.  She has normal range of motion of her right shoulder.   She also states a lot of weakness in her right arm and hand.  Her right fingers are cramping and they trigger.  These are not new symptoms-they are chronic.  The right side of her neck was sore and stiff, but it is not now.     She has seen Dr Patrice Paradise and had xrays of her neck and back and he did not think it was coming from her back or neck.  They advised she come here.       Medications and allergies reviewed with patient and updated if appropriate.  Patient Active Problem List   Diagnosis Date Noted  . Preop examination 07/24/2019  . Chronic back pain 07/24/2019  . GERD (gastroesophageal reflux disease) 07/24/2019  . Chronic migraine 04/17/2019  . Low back pain 04/17/2019  . Chronic pain of left knee 11/28/2018  . Anxiety 08/01/2018  . Spinal stenosis 08/01/2018  . Tingling in extremities 12/02/2017  . Tingling of left upper extremity 10/25/2017  . Ineffective thermoregulation 10/25/2017  . Vitamin D deficiency 05/09/2017  . CKD (chronic kidney disease) 10/24/2016  . Normocytic anemia 10/24/2016  . Periodic limb movement 10/09/2016  . T12 burst fracture ([place]) 08/05/2016  . OSA on CPAP 04/01/2016  . Insomnia 01/12/2016  . Chronic pain syndrome 01/12/2016  . Joint  pain 01/12/2016  . Numbness and tingling 01/12/2016  . Chronic fatigue 01/12/2016  . Depression 01/12/2016  . Neck pain 06/10/2015  . Bilateral calf pain 03/05/2015  . Fibromyalgia 03/04/2015  . Postprocedural hypothyroidism 03/04/2015  . Postprocedural hypoparathyroidism ([place]) 03/04/2015  . Sjogren's syndrome ([place]) 11/01/2012  . Chronic constipation 05/14/2009  . THYROID NODULE 03/10/2009  . PALPITATIONS 03/03/2009    Current Outpatient Medications on File Prior to Visit  Medication Sig Dispense Refill  . baclofen (LIORESAL) 10 MG tablet Take 10 mg by mouth as needed for muscle spasms.    . Biotin 5000 MCG TABS Take 1 tablet by mouth daily.    . calcitRIOL (ROCALTROL) 0.5 MCG capsule TAKE TWICE DAILY 60 capsule 0  . cycloSPORINE (RESTASIS) 0.05 % ophthalmic emulsion Place 1 drop into both eyes 2 (two) times daily.    . diclofenac sodium (VOLTAREN) 1 % GEL Apply 4 g topically 4 (four) times daily. 100 g 5  . Fremanezumab-vfrm (AJOVY) 225 MG/1.5ML SOSY Inject 225 mg into the skin every 30 (thirty) days. 1.5 mL 11  . gabapentin (NEURONTIN) 300 MG capsule TAKE 2 CAPSULES (600 MG TOTAL) BY MOUTH 2 (TWO) TIMES DAILY. 90 capsule 0  . hydroxychloroquine (PLAQUENIL) 200 MG tablet Take 400 mg by mouth daily.     . hydrOXYzine (VISTARIL)  50 MG capsule TAKE 1 CAPSULE (50 MG TOTAL) BY MOUTH 3 (THREE) TIMES DAILY AS NEEDED. 90 capsule 2  . linaclotide (LINZESS) 145 MCG CAPS capsule Take 1 capsule (145 mcg total) by mouth daily before breakfast. 30 capsule 5  . omeprazole (PRILOSEC) 20 MG capsule Take 1 capsule (20 mg total) by mouth daily. 90 capsule 3  . ondansetron (ZOFRAN) 4 MG tablet TAKE 1 TABLET BY MOUTH EVERY 8 HOURS AS NEEDED FOR NAUSEA AND VOMITING 30 tablet 0  . pilocarpine (SALAGEN) 5 MG tablet TAKE 1 TABLET PO TWICE A DAY  3  . pregabalin (LYRICA) 75 MG capsule 75 mg 2 (two) times daily.     . sertraline (ZOLOFT) 50 MG tablet Take 50 mg by mouth daily.    Marland Kitchen SYNTHROID 100 MCG tablet  TAKE 1 TABLET BY MOUTH EVERY DAY 30 tablet 3  . tiZANidine (ZANAFLEX) 4 MG tablet Take 1 tablet (4 mg total) by mouth every 6 (six) hours as needed for muscle spasms. 30 tablet 6  . topiramate (TOPAMAX) 200 MG tablet Take 200 mg by mouth at bedtime.     . traMADol (ULTRAM) 50 MG tablet tramadol 50 mg tablet   100 mg by oral route.    . traZODone (DESYREL) 50 MG tablet TAKE 1 TABLET (50 MG) BY MOUTH DAILY AT BEDTIME AS NEEDED FOR SLEEP     No current facility-administered medications on file prior to visit.    Past Medical History:  Diagnosis Date  . Fibromyalgia   . Migraine    Dr Domingo Cocking  . Peripheral neuropathy   . Sjogren's syndrome ([place])    Dr Ouida Sills  . Thyroid nodule     Past Surgical History:  Procedure Laterality Date  . Intrauterine Ablation  2011   Dr Raphael Gibney ( now seeing Dr Christophe Louis)  . lip biopsy  08/2012   Dr Thea Gist  . MANDIBLE SURGERY     TMJ   . THYROIDECTOMY  06/2009   benign nodule  . TUBAL LIGATION      Social History   Socioeconomic History  . Marital status: Widowed    Spouse name: Not on file  . Number of children: 2  . Years of education: 26  . Highest education level: Not on file  Occupational History  . Occupation: Product Compliance Specialist  Tobacco Use  . Smoking status: Never Smoker  . Smokeless tobacco: Never Used  Substance and Sexual Activity  . Alcohol use: Yes    Alcohol/week: 3.0 standard drinks    Types: 3 Shots of liquor per week    Comment:  3 shots of liquor per week  . Drug use: No  . Sexual activity: Not on file  Other Topics Concern  . Not on file  Social History Narrative   Lives at home with her daughter.   Right-handed.   One cup caffeine per day.   Social Determinants of Health   Financial Resource Strain:   . Difficulty of Paying Living Expenses: Not on file  Food Insecurity:   . Worried About Charity fundraiser in the Last Year: Not on file  . Ran Out of Food in the Last Year: Not on file   Transportation Needs:   . Lack of Transportation (Medical): Not on file  . Lack of Transportation (Non-Medical): Not on file  Physical Activity:   . Days of Exercise per Week: Not on file  . Minutes of Exercise per Session: Not on file  Stress:   .  Feeling of Stress : Not on file  Social Connections:   . Frequency of Communication with Friends and Family: Not on file  . Frequency of Social Gatherings with Friends and Family: Not on file  . Attends Religious Services: Not on file  . Active Member of Clubs or Organizations: Not on file  . Attends Archivist Meetings: Not on file  . Marital Status: Not on file    Family History  Problem Relation Age of Onset  . AAA (abdominal aortic aneurysm) Father   . Stroke Mother 18  . Diabetes Mother   . Kidney failure Mother   . Stroke Sister 24  . Hypertension Sister   . Heart attack Maternal Grandfather        ? age  . Heart attack Maternal Grandmother        ? age  . Diabetes Sister   . Hypertension Sister   . Diabetes Brother   . Kidney failure Brother   . Cancer Neg Hx     Review of Systems Per HPI    Objective:   Vitals:   04/20/20 1511  BP: 130/90  Pulse: 73  Temp: 98.4 F (36.9 C)  SpO2: 97%   BP Readings from Last 3 Encounters:  04/20/20 130/90  04/17/19 129/83  07/31/18 130/82   Wt Readings from Last 3 Encounters:  04/20/20 185 lb 6.4 oz (84.1 kg)  04/17/19 185 lb (83.9 kg)  07/31/18 195 lb 12.8 oz (88.8 kg)   Body mass index is 32.33 kg/m.   Physical Exam Constitutional:      General: She is not in acute distress.    Appearance: Normal appearance. She is not ill-appearing.  HENT:     Head: Normocephalic and atraumatic.  Neck:     Vascular: Carotid bruit (Possible right-sided carotid bruit) present.  Cardiovascular:     Rate and Rhythm: Normal rate and regular rhythm.  Pulmonary:     Effort: Pulmonary effort is normal. No respiratory distress.     Breath sounds: No wheezing or rales.   Musculoskeletal:        General: Tenderness (Mild tenderness posterior neck and upper back muscles-chronic) present.  Skin:    General: Skin is warm and dry.     Findings: No erythema or rash.  Neurological:     Mental Status: She is alert.     Sensory: Sensory deficit (Decreased sensation right forearm.  Normal sensation right upper arm, right hand and right chest/face) present.            Assessment & Plan:    See Problem List for Assessment and Plan of chronic medical problems.    This visit occurred during the SARS-CoV-2 public health emergency.  Safety protocols were in place, including screening questions prior to the visit, additional usage of staff PPE, and extensive cleaning of exam room while observing appropriate contact time as indicated for disinfecting solutions.

## 2020-04-20 ENCOUNTER — Encounter: Payer: Self-pay | Admitting: Internal Medicine

## 2020-04-20 ENCOUNTER — Other Ambulatory Visit: Payer: Self-pay

## 2020-04-20 ENCOUNTER — Ambulatory Visit (INDEPENDENT_AMBULATORY_CARE_PROVIDER_SITE_OTHER): Payer: Medicare HMO | Admitting: Internal Medicine

## 2020-04-20 VITALS — BP 130/90 | HR 73 | Temp 98.4°F | Wt 185.4 lb

## 2020-04-20 DIAGNOSIS — R209 Unspecified disturbances of skin sensation: Secondary | ICD-10-CM | POA: Diagnosis not present

## 2020-04-20 DIAGNOSIS — R0989 Other specified symptoms and signs involving the circulatory and respiratory systems: Secondary | ICD-10-CM | POA: Insufficient documentation

## 2020-04-20 NOTE — Assessment & Plan Note (Signed)
?    Right carotid bruit Neck angiography with possible FMD Carotid ultrasound ordered

## 2020-04-20 NOTE — Assessment & Plan Note (Signed)
Acute Sensation of a skin tightening or pulling sensation from her right upper arm up to her cheek has been going on intermittently for the past 3 weeks.  Seems to be occurring more often Not related to activity, changes in position No real associated symptoms.  She does have some chronic symptoms in her right arm, but those are not new Does not seem to be neurological ?  Neck angiography in the past showed possible FMD, but that still seems unlikely to be the cause Will check carotid ultrasound Has already been evaluated by her neurosurgeon Further evaluation depending on carotid ultrasound

## 2020-04-20 NOTE — Patient Instructions (Signed)
A carotid ultrasound was ordered.

## 2020-05-07 DIAGNOSIS — G43719 Chronic migraine without aura, intractable, without status migrainosus: Secondary | ICD-10-CM | POA: Diagnosis not present

## 2020-05-07 DIAGNOSIS — G43111 Migraine with aura, intractable, with status migrainosus: Secondary | ICD-10-CM | POA: Diagnosis not present

## 2020-05-07 DIAGNOSIS — G43019 Migraine without aura, intractable, without status migrainosus: Secondary | ICD-10-CM | POA: Diagnosis not present

## 2020-05-08 ENCOUNTER — Ambulatory Visit (HOSPITAL_COMMUNITY)
Admission: RE | Admit: 2020-05-08 | Payer: Medicaid Other | Source: Ambulatory Visit | Attending: Internal Medicine | Admitting: Internal Medicine

## 2020-05-11 ENCOUNTER — Other Ambulatory Visit: Payer: Self-pay

## 2020-05-11 ENCOUNTER — Ambulatory Visit (HOSPITAL_COMMUNITY)
Admission: RE | Admit: 2020-05-11 | Discharge: 2020-05-11 | Disposition: A | Discharge status: Home / Self Care | Payer: Medicare HMO | Source: Ambulatory Visit | Attending: Cardiology | Admitting: Cardiology

## 2020-05-11 DIAGNOSIS — R0989 Other specified symptoms and signs involving the circulatory and respiratory systems: Secondary | ICD-10-CM | POA: Insufficient documentation

## 2020-05-12 ENCOUNTER — Encounter: Payer: Self-pay | Admitting: Internal Medicine

## 2020-05-13 ENCOUNTER — Encounter: Payer: Self-pay | Admitting: Internal Medicine

## 2020-05-13 DIAGNOSIS — I773 Arterial fibromuscular dysplasia: Secondary | ICD-10-CM | POA: Insufficient documentation

## 2020-06-02 DIAGNOSIS — G43719 Chronic migraine without aura, intractable, without status migrainosus: Secondary | ICD-10-CM | POA: Diagnosis not present

## 2020-06-02 DIAGNOSIS — G43019 Migraine without aura, intractable, without status migrainosus: Secondary | ICD-10-CM | POA: Diagnosis not present

## 2020-06-02 DIAGNOSIS — G43111 Migraine with aura, intractable, with status migrainosus: Secondary | ICD-10-CM | POA: Diagnosis not present

## 2020-06-05 DIAGNOSIS — M546 Pain in thoracic spine: Secondary | ICD-10-CM | POA: Diagnosis not present

## 2020-06-05 DIAGNOSIS — M542 Cervicalgia: Secondary | ICD-10-CM | POA: Diagnosis not present

## 2020-06-05 DIAGNOSIS — M4325 Fusion of spine, thoracolumbar region: Secondary | ICD-10-CM | POA: Diagnosis not present

## 2020-06-05 DIAGNOSIS — M47894 Other spondylosis, thoracic region: Secondary | ICD-10-CM | POA: Diagnosis not present

## 2020-06-05 DIAGNOSIS — M7918 Myalgia, other site: Secondary | ICD-10-CM | POA: Diagnosis not present

## 2020-06-09 ENCOUNTER — Other Ambulatory Visit: Payer: Self-pay | Admitting: Orthopaedic Surgery

## 2020-06-09 DIAGNOSIS — M542 Cervicalgia: Secondary | ICD-10-CM

## 2020-06-30 DIAGNOSIS — M47812 Spondylosis without myelopathy or radiculopathy, cervical region: Secondary | ICD-10-CM | POA: Diagnosis not present

## 2020-06-30 DIAGNOSIS — M7061 Trochanteric bursitis, right hip: Secondary | ICD-10-CM | POA: Diagnosis not present

## 2020-06-30 DIAGNOSIS — M47894 Other spondylosis, thoracic region: Secondary | ICD-10-CM | POA: Diagnosis not present

## 2020-06-30 DIAGNOSIS — M4325 Fusion of spine, thoracolumbar region: Secondary | ICD-10-CM | POA: Diagnosis not present

## 2020-07-06 ENCOUNTER — Inpatient Hospital Stay: Admission: RE | Admit: 2020-07-06 | Payer: Medicare HMO | Source: Ambulatory Visit

## 2020-07-21 DIAGNOSIS — N72 Inflammatory disease of cervix uteri: Secondary | ICD-10-CM | POA: Diagnosis not present

## 2020-07-21 DIAGNOSIS — B009 Herpesviral infection, unspecified: Secondary | ICD-10-CM | POA: Diagnosis not present

## 2020-07-21 DIAGNOSIS — B977 Papillomavirus as the cause of diseases classified elsewhere: Secondary | ICD-10-CM | POA: Diagnosis not present

## 2020-07-22 DIAGNOSIS — D631 Anemia in chronic kidney disease: Secondary | ICD-10-CM | POA: Diagnosis not present

## 2020-07-22 DIAGNOSIS — M797 Fibromyalgia: Secondary | ICD-10-CM | POA: Diagnosis not present

## 2020-07-22 DIAGNOSIS — E669 Obesity, unspecified: Secondary | ICD-10-CM | POA: Diagnosis not present

## 2020-07-22 DIAGNOSIS — Z6831 Body mass index (BMI) 31.0-31.9, adult: Secondary | ICD-10-CM | POA: Diagnosis not present

## 2020-07-22 DIAGNOSIS — N1832 Chronic kidney disease, stage 3b: Secondary | ICD-10-CM | POA: Diagnosis not present

## 2020-07-22 DIAGNOSIS — M3500 Sicca syndrome, unspecified: Secondary | ICD-10-CM | POA: Diagnosis not present

## 2020-07-22 DIAGNOSIS — M5030 Other cervical disc degeneration, unspecified cervical region: Secondary | ICD-10-CM | POA: Diagnosis not present

## 2020-07-27 ENCOUNTER — Ambulatory Visit
Admission: RE | Admit: 2020-07-27 | Discharge: 2020-07-27 | Disposition: A | Discharge status: Home / Self Care | Payer: Medicare HMO | Source: Ambulatory Visit | Attending: Orthopaedic Surgery | Admitting: Orthopaedic Surgery

## 2020-07-27 ENCOUNTER — Other Ambulatory Visit: Payer: Self-pay

## 2020-07-27 DIAGNOSIS — M4802 Spinal stenosis, cervical region: Secondary | ICD-10-CM | POA: Diagnosis not present

## 2020-07-27 DIAGNOSIS — M542 Cervicalgia: Secondary | ICD-10-CM

## 2020-07-29 DIAGNOSIS — M47894 Other spondylosis, thoracic region: Secondary | ICD-10-CM | POA: Diagnosis not present

## 2020-07-29 DIAGNOSIS — M4325 Fusion of spine, thoracolumbar region: Secondary | ICD-10-CM | POA: Diagnosis not present

## 2020-07-29 DIAGNOSIS — M47812 Spondylosis without myelopathy or radiculopathy, cervical region: Secondary | ICD-10-CM | POA: Diagnosis not present

## 2020-08-04 DIAGNOSIS — H52223 Regular astigmatism, bilateral: Secondary | ICD-10-CM | POA: Diagnosis not present

## 2020-09-02 ENCOUNTER — Other Ambulatory Visit: Payer: Self-pay | Admitting: Internal Medicine

## 2020-09-30 DIAGNOSIS — M542 Cervicalgia: Secondary | ICD-10-CM | POA: Diagnosis not present

## 2020-09-30 DIAGNOSIS — M791 Myalgia, unspecified site: Secondary | ICD-10-CM | POA: Diagnosis not present

## 2020-09-30 DIAGNOSIS — G43019 Migraine without aura, intractable, without status migrainosus: Secondary | ICD-10-CM | POA: Diagnosis not present

## 2020-09-30 DIAGNOSIS — G43719 Chronic migraine without aura, intractable, without status migrainosus: Secondary | ICD-10-CM | POA: Diagnosis not present

## 2020-09-30 DIAGNOSIS — G518 Other disorders of facial nerve: Secondary | ICD-10-CM | POA: Diagnosis not present

## 2020-09-30 DIAGNOSIS — G43111 Migraine with aura, intractable, with status migrainosus: Secondary | ICD-10-CM | POA: Diagnosis not present

## 2020-10-01 DIAGNOSIS — Z79899 Other long term (current) drug therapy: Secondary | ICD-10-CM | POA: Diagnosis not present

## 2020-10-01 DIAGNOSIS — M47894 Other spondylosis, thoracic region: Secondary | ICD-10-CM | POA: Diagnosis not present

## 2020-10-01 DIAGNOSIS — Z6831 Body mass index (BMI) 31.0-31.9, adult: Secondary | ICD-10-CM | POA: Diagnosis not present

## 2020-10-01 DIAGNOSIS — G894 Chronic pain syndrome: Secondary | ICD-10-CM | POA: Diagnosis not present

## 2020-10-01 DIAGNOSIS — M47812 Spondylosis without myelopathy or radiculopathy, cervical region: Secondary | ICD-10-CM | POA: Diagnosis not present

## 2020-10-01 DIAGNOSIS — Z79891 Long term (current) use of opiate analgesic: Secondary | ICD-10-CM | POA: Diagnosis not present

## 2020-10-01 DIAGNOSIS — M4325 Fusion of spine, thoracolumbar region: Secondary | ICD-10-CM | POA: Diagnosis not present

## 2020-10-29 DIAGNOSIS — M47817 Spondylosis without myelopathy or radiculopathy, lumbosacral region: Secondary | ICD-10-CM | POA: Diagnosis not present

## 2020-10-29 DIAGNOSIS — M47894 Other spondylosis, thoracic region: Secondary | ICD-10-CM | POA: Diagnosis not present

## 2020-10-29 DIAGNOSIS — M47896 Other spondylosis, lumbar region: Secondary | ICD-10-CM | POA: Diagnosis not present

## 2020-10-29 DIAGNOSIS — M545 Low back pain, unspecified: Secondary | ICD-10-CM | POA: Diagnosis not present

## 2020-10-29 DIAGNOSIS — M4325 Fusion of spine, thoracolumbar region: Secondary | ICD-10-CM | POA: Diagnosis not present

## 2020-10-29 DIAGNOSIS — M47812 Spondylosis without myelopathy or radiculopathy, cervical region: Secondary | ICD-10-CM | POA: Diagnosis not present

## 2020-11-18 DIAGNOSIS — Z6831 Body mass index (BMI) 31.0-31.9, adult: Secondary | ICD-10-CM | POA: Diagnosis not present

## 2020-11-18 DIAGNOSIS — M4726 Other spondylosis with radiculopathy, lumbar region: Secondary | ICD-10-CM | POA: Diagnosis not present

## 2020-11-18 DIAGNOSIS — M4325 Fusion of spine, thoracolumbar region: Secondary | ICD-10-CM | POA: Diagnosis not present

## 2020-11-18 DIAGNOSIS — M545 Low back pain, unspecified: Secondary | ICD-10-CM | POA: Diagnosis not present

## 2020-11-24 ENCOUNTER — Other Ambulatory Visit: Payer: Self-pay | Admitting: Orthopaedic Surgery

## 2020-11-24 DIAGNOSIS — M4726 Other spondylosis with radiculopathy, lumbar region: Secondary | ICD-10-CM

## 2020-11-29 ENCOUNTER — Other Ambulatory Visit: Payer: Self-pay

## 2020-11-29 ENCOUNTER — Ambulatory Visit
Admission: RE | Admit: 2020-11-29 | Discharge: 2020-11-29 | Disposition: A | Discharge status: Home / Self Care | Payer: Medicare HMO | Source: Ambulatory Visit | Attending: Orthopaedic Surgery | Admitting: Orthopaedic Surgery

## 2020-11-29 DIAGNOSIS — M4726 Other spondylosis with radiculopathy, lumbar region: Secondary | ICD-10-CM

## 2020-11-29 DIAGNOSIS — M545 Low back pain, unspecified: Secondary | ICD-10-CM | POA: Diagnosis not present

## 2020-12-01 DIAGNOSIS — M4726 Other spondylosis with radiculopathy, lumbar region: Secondary | ICD-10-CM | POA: Diagnosis not present

## 2020-12-01 DIAGNOSIS — M4325 Fusion of spine, thoracolumbar region: Secondary | ICD-10-CM | POA: Diagnosis not present

## 2020-12-01 DIAGNOSIS — Z6831 Body mass index (BMI) 31.0-31.9, adult: Secondary | ICD-10-CM | POA: Diagnosis not present

## 2020-12-11 DIAGNOSIS — M7061 Trochanteric bursitis, right hip: Secondary | ICD-10-CM | POA: Diagnosis not present

## 2020-12-11 DIAGNOSIS — M5416 Radiculopathy, lumbar region: Secondary | ICD-10-CM | POA: Diagnosis not present

## 2020-12-11 DIAGNOSIS — M4325 Fusion of spine, thoracolumbar region: Secondary | ICD-10-CM | POA: Diagnosis not present

## 2021-01-01 DIAGNOSIS — M4325 Fusion of spine, thoracolumbar region: Secondary | ICD-10-CM | POA: Diagnosis not present

## 2021-01-01 DIAGNOSIS — M7061 Trochanteric bursitis, right hip: Secondary | ICD-10-CM | POA: Diagnosis not present

## 2021-01-01 DIAGNOSIS — M7552 Bursitis of left shoulder: Secondary | ICD-10-CM | POA: Diagnosis not present

## 2021-01-19 DIAGNOSIS — G43719 Chronic migraine without aura, intractable, without status migrainosus: Secondary | ICD-10-CM | POA: Diagnosis not present

## 2021-01-19 DIAGNOSIS — G43019 Migraine without aura, intractable, without status migrainosus: Secondary | ICD-10-CM | POA: Diagnosis not present

## 2021-01-19 DIAGNOSIS — G43111 Migraine with aura, intractable, with status migrainosus: Secondary | ICD-10-CM | POA: Diagnosis not present

## 2021-03-05 DIAGNOSIS — M7061 Trochanteric bursitis, right hip: Secondary | ICD-10-CM | POA: Diagnosis not present

## 2021-03-05 DIAGNOSIS — M7552 Bursitis of left shoulder: Secondary | ICD-10-CM | POA: Diagnosis not present

## 2021-03-05 DIAGNOSIS — M4325 Fusion of spine, thoracolumbar region: Secondary | ICD-10-CM | POA: Diagnosis not present

## 2021-03-24 DIAGNOSIS — M3500 Sicca syndrome, unspecified: Secondary | ICD-10-CM | POA: Diagnosis not present

## 2021-03-24 DIAGNOSIS — D631 Anemia in chronic kidney disease: Secondary | ICD-10-CM | POA: Diagnosis not present

## 2021-03-24 DIAGNOSIS — N1832 Chronic kidney disease, stage 3b: Secondary | ICD-10-CM | POA: Diagnosis not present

## 2021-03-24 DIAGNOSIS — Z6828 Body mass index (BMI) 28.0-28.9, adult: Secondary | ICD-10-CM | POA: Diagnosis not present

## 2021-03-24 DIAGNOSIS — E663 Overweight: Secondary | ICD-10-CM | POA: Diagnosis not present

## 2021-03-24 DIAGNOSIS — R634 Abnormal weight loss: Secondary | ICD-10-CM | POA: Diagnosis not present

## 2021-03-24 DIAGNOSIS — M797 Fibromyalgia: Secondary | ICD-10-CM | POA: Diagnosis not present

## 2021-03-24 DIAGNOSIS — M5030 Other cervical disc degeneration, unspecified cervical region: Secondary | ICD-10-CM | POA: Diagnosis not present

## 2021-04-14 ENCOUNTER — Telehealth: Payer: Self-pay | Admitting: Lab

## 2021-04-14 NOTE — Progress Notes (Signed)
  Chronic Care Management   Outreach Note  04/14/2021 Name: Terri Roberts MRN: [id number] DOB: 1960-09-21  Referred by: [name], MD Reason for referral : No chief complaint on file.   An unsuccessful telephone outreach was attempted today. The patient was referred to the pharmacist for assistance with care management and care coordination.   Follow Up Plan:   [place]

## 2021-04-19 ENCOUNTER — Emergency Department (INDEPENDENT_AMBULATORY_CARE_PROVIDER_SITE_OTHER)
Admission: EM | Admit: 2021-04-19 | Discharge: 2021-04-19 | Disposition: A | Discharge status: Home / Self Care | Payer: Medicare HMO | Source: Home / Self Care

## 2021-04-19 ENCOUNTER — Emergency Department (INDEPENDENT_AMBULATORY_CARE_PROVIDER_SITE_OTHER): Payer: Medicare HMO

## 2021-04-19 ENCOUNTER — Telehealth: Payer: Self-pay | Admitting: Internal Medicine

## 2021-04-19 ENCOUNTER — Other Ambulatory Visit: Payer: Self-pay

## 2021-04-19 DIAGNOSIS — J309 Allergic rhinitis, unspecified: Secondary | ICD-10-CM

## 2021-04-19 DIAGNOSIS — R059 Cough, unspecified: Secondary | ICD-10-CM

## 2021-04-19 DIAGNOSIS — R0981 Nasal congestion: Secondary | ICD-10-CM

## 2021-04-19 DIAGNOSIS — J0100 Acute maxillary sinusitis, unspecified: Secondary | ICD-10-CM

## 2021-04-19 MED ORDER — CEFDINIR 300 MG PO CAPS: 300.0000 mg | ORAL_CAPSULE | Freq: Two times a day (BID) | ORAL | 0 refills | Status: DC

## 2021-04-19 MED ORDER — FEXOFENADINE HCL 180 MG PO TABS: 180.0000 mg | ORAL_TABLET | Freq: Every day | ORAL | 0 refills | Status: DC

## 2021-04-19 NOTE — Telephone Encounter (Signed)
Patient called having congestion in her chest, wheezing, cough, and hoarse. Sx started 2 nights ago. Stated she had not taken a COVID test yet. Offered a virtual visit with a provider but patient declined. Stated "it was not fair for them to charge her insurance twice when they know she will have to listen to her chest eventually." Advised her to go to UC if she would like to be seen in person.

## 2021-04-19 NOTE — ED Triage Notes (Signed)
Pt presents to Urgent Care with c/o nasal congestion and cough x 2 days. Afebrile. Has not done home COVID test; has been vaccinated.

## 2021-04-19 NOTE — Discharge Instructions (Addendum)
Advised/instructed patient to take medication as directed with food to completion.  Advised/instructed patient to discontinue Claritin and start Allegra 180 mg daily with first dose of antibiotic for the next 7 days.  Encourage patient increase daily water intake while taking these medications.

## 2021-04-19 NOTE — ED Provider Notes (Signed)
[name]    CSN: [id number] Arrival date & time: 04/19/21  1317      History   Chief Complaint Chief Complaint  Patient presents with   Cough   Nasal Congestion    HPI Terri Roberts is a 60 y.o. female.   HPI 60 year old female presents with nasal congestion and cough for 4-5 days.  Patient is vaccinated for COVID-19.  Past Medical History:  Diagnosis Date   Fibromyalgia    Migraine    Dr Domingo Cocking   Peripheral neuropathy    Sjogren's syndrome ([place])    Dr Ouida Sills   Thyroid nodule     Patient Active Problem List   Diagnosis Date Noted   Fibromuscular dysplasia of both carotid arteries ([place]) 05/13/2020   Right carotid bruit 04/20/2020   Sensation disturbance of skin 04/20/2020   Preop examination 07/24/2019   Chronic back pain 07/24/2019   GERD (gastroesophageal reflux disease) 07/24/2019   Chronic migraine 04/17/2019   Low back pain 04/17/2019   Chronic pain of left knee 11/28/2018   Anxiety 08/01/2018   Spinal stenosis 08/01/2018   Tingling in extremities 12/02/2017   Tingling of left upper extremity 10/25/2017   Ineffective thermoregulation 10/25/2017   Vitamin D deficiency 05/09/2017   CKD (chronic kidney disease) 10/24/2016   Normocytic anemia 10/24/2016   Periodic limb movement 10/09/2016   T12 burst fracture ([place]) 08/05/2016   OSA on CPAP 04/01/2016   Insomnia 01/12/2016   Chronic pain syndrome 01/12/2016   Joint pain 01/12/2016   Numbness and tingling 01/12/2016   Chronic fatigue 01/12/2016   Depression 01/12/2016   Neck pain 06/10/2015   Bilateral calf pain 03/05/2015   Fibromyalgia 03/04/2015   Postprocedural hypothyroidism 03/04/2015   Postprocedural hypoparathyroidism ([place]) 03/04/2015   Sjogren's syndrome ([place]) 11/01/2012   Chronic constipation 05/14/2009   THYROID NODULE 03/10/2009   PALPITATIONS 03/03/2009    Past Surgical History:  Procedure Laterality Date   BACK SURGERY     Intrauterine Ablation  08/01/2009    Dr Raphael Gibney ( now seeing Dr Christophe Louis)   lip biopsy  08/01/2012   Dr Thea Gist   MANDIBLE SURGERY     TMJ    THYROIDECTOMY  06/01/2009   benign nodule   TUBAL LIGATION      OB History   No obstetric history on file.      Home Medications    Prior to Admission medications   Medication Sig Start Date End Date Taking? Authorizing Provider  cefdinir (OMNICEF) 300 MG capsule Take 1 capsule (300 mg total) by mouth 2 (two) times daily for 7 days. 04/19/21 04/26/21 Yes Eliezer Lofts, FNP  fexofenadine [facility] ALLERGY) 180 MG tablet Take 1 tablet (180 mg total) by mouth daily for 15 days. 04/19/21 05/04/21 Yes Eliezer Lofts, FNP  baclofen (LIORESAL) 10 MG tablet Take 10 mg by mouth as needed for muscle spasms.    [provider]  Biotin 5000 MCG TABS Take 1 tablet by mouth daily.    [provider]  calcitRIOL (ROCALTROL) 0.5 MCG capsule TAKE TWICE DAILY 06/20/18   Elayne Snare, MD  cycloSPORINE (RESTASIS) 0.05 % ophthalmic emulsion Place 1 drop into both eyes 2 (two) times daily.    [provider]  diclofenac sodium (VOLTAREN) 1 % GEL Apply 4 g topically 4 (four) times daily. 11/28/18   Burns, Claudina Lick, MD  Fremanezumab-vfrm (AJOVY) 225 MG/1.5ML SOSY Inject 225 mg into the skin every 30 (thirty) days. 04/17/19   Marcial Pacas, MD  gabapentin (NEURONTIN) 300 MG capsule TAKE 2 CAPSULES (600 MG TOTAL) BY MOUTH 2 (TWO) TIMES DAILY. 08/21/18   [name], MD  hydroxychloroquine (PLAQUENIL) 200 MG tablet Take 400 mg by mouth daily.     [provider]  hydrOXYzine (VISTARIL) 50 MG capsule TAKE 1 CAPSULE (50 MG TOTAL) BY MOUTH 3 (THREE) TIMES DAILY AS NEEDED. 08/29/18   [name], MD  linaclotide [facility]) 145 MCG CAPS capsule Take 1 capsule (145 mcg total) by mouth daily before breakfast. 08/01/18   Burns, Claudina Lick, MD  omeprazole (PRILOSEC) 20 MG capsule TAKE 1 CAPSULE BY MOUTH EVERY DAY 09/02/20   [name], MD  ondansetron (ZOFRAN) 4 MG tablet TAKE 1 TABLET BY  MOUTH EVERY 8 HOURS AS NEEDED FOR NAUSEA AND VOMITING 07/02/18   [name], MD  pilocarpine (SALAGEN) 5 MG tablet TAKE 1 TABLET PO TWICE A DAY 03/12/16   [provider]  pregabalin (LYRICA) 75 MG capsule 75 mg 2 (two) times daily.     [provider]  sertraline (ZOLOFT) 50 MG tablet Take 50 mg by mouth daily. 04/08/19   [provider]  SYNTHROID 100 MCG tablet TAKE 1 TABLET BY MOUTH EVERY DAY 03/23/18   Elayne Snare, MD  tiZANidine (ZANAFLEX) 4 MG tablet Take 1 tablet (4 mg total) by mouth every 6 (six) hours as needed for muscle spasms. 04/17/19   Marcial Pacas, MD  topiramate (TOPAMAX) 200 MG tablet Take 200 mg by mouth at bedtime.     [provider]  traMADol (ULTRAM) 50 MG tablet tramadol 50 mg tablet   100 mg by oral route. 08/05/16   [provider]  traZODone (DESYREL) 50 MG tablet TAKE 1 TABLET (50 MG) BY MOUTH DAILY AT BEDTIME AS NEEDED FOR SLEEP 11/30/18   [provider]    Family History Family History  Problem Relation Age of Onset   AAA (abdominal aortic aneurysm) Father    Stroke Mother 66   Diabetes Mother    Kidney failure Mother    Stroke Sister 71   Hypertension Sister    Heart attack Maternal Grandfather        ? age   Heart attack Maternal Grandmother        ? age   Diabetes Sister    Hypertension Sister    Diabetes Brother    Kidney failure Brother    Cancer Neg Hx     Social History Social History   Tobacco Use   Smoking status: Never   Smokeless tobacco: Never  Vaping Use   Vaping Use: Never used  Substance Use Topics   Alcohol use: Not Currently    Comment: rarely   Drug use: No     Allergies   Nsaids   Review of Systems Review of Systems  HENT:  Positive for congestion.   Respiratory:  Positive for cough.   All other systems reviewed and are negative.   Physical Exam Triage Vital Signs ED Triage Vitals  Enc Vitals Group     BP 04/19/21 1333 120/81     Pulse Rate 04/19/21 1333 79      Resp 04/19/21 1333 20     Temp 04/19/21 1333 98.7 F (37.1 C)     Temp Source 04/19/21 1333 Oral     SpO2 04/19/21 1333 100 %     Weight 04/19/21 1335 166 lb (75.3 kg)     Height 04/19/21 1335 5' 3.5" (1.613 m)  Head Circumference --      Peak Flow --      Pain Score 04/19/21 1334 8     Pain Loc --      Pain Edu? --      Excl. in [place]? --    No data found.  Updated Vital Signs BP 120/81 (BP Location: Right Arm)   Pulse 79   Temp 98.7 F (37.1 C) (Oral)   Resp 20   Ht 5' 3.5" (1.613 m)   Wt 166 lb (75.3 kg)   SpO2 100%   BMI 28.94 kg/m   Physical Exam Vitals and nursing note reviewed.  Constitutional:      General: Terri Roberts is not in acute distress.    Appearance: Normal appearance. Terri Roberts is normal weight. Terri Roberts is ill-appearing.  HENT:     Head: Normocephalic and atraumatic.     Right Ear: External ear normal. Tympanic membrane is retracted.     Left Ear: External ear normal. Tympanic membrane is retracted.     Ears:     Comments: Moderate eustachian tube dysfunction noted bilaterally    Nose:     Right Turbinates: Enlarged.     Left Turbinates: Enlarged.     Right Sinus: Maxillary sinus tenderness present.     Left Sinus: Maxillary sinus tenderness present.     Comments: Turbinates are erythematous/edematous    Mouth/Throat:     Lips: Pink.     Pharynx: Oropharynx is clear. Uvula midline.     Comments: Moderate amount of clear drainage of posterior oropharynx noted Eyes:     Extraocular Movements: Extraocular movements intact.     Conjunctiva/sclera: Conjunctivae normal.     Pupils: Pupils are equal, round, and reactive to light.  Cardiovascular:     Rate and Rhythm: Normal rate and regular rhythm.     Pulses: Normal pulses.     Heart sounds: Normal heart sounds. No murmur heard. Pulmonary:     Effort: Pulmonary effort is normal. No respiratory distress.     Breath sounds: No stridor. Rhonchi present. No wheezing or rales.     Comments: Mild diffuse scattered  rhonchi noted Musculoskeletal:        General: Normal range of motion.     Cervical back: Normal range of motion and neck supple. Tenderness present.  Lymphadenopathy:     Cervical: Cervical adenopathy present.  Skin:    General: Skin is warm and dry.  Neurological:     General: No focal deficit present.     Mental Status: Terri Roberts is alert and oriented to person, place, and time. Mental status is at baseline.  Psychiatric:        Mood and Affect: Mood normal.        Behavior: Behavior normal.        Thought Content: Thought content normal.     UC Treatments / Results  Labs (all labs ordered are listed, but only abnormal results are displayed) Labs Reviewed - No data to display  EKG   Radiology DG Chest 2 View  Result Date: 04/19/2021 CLINICAL DATA:  Cough and nasal congestion for 2 days. EXAM: CHEST - 2 VIEW COMPARISON:  08/26/2017 FINDINGS: Lower thoracic spine fixation. Midline trachea. Normal heart size and mediastinal contours. No pleural effusion or pneumothorax. Clear lungs. IMPRESSION: No acute cardiopulmonary disease. Electronically Signed   By: Abigail Miyamoto M.D.   On: 04/19/2021 14:56    Procedures Procedures (including critical care time)  Medications Ordered in UC Medications -  No data to display  Initial Impression / Assessment and Plan / UC Course  I have reviewed the triage vital signs and the nursing notes.  Pertinent labs & imaging results that were available during my care of the patient were reviewed by me and considered in my medical decision making (see chart for details).     MDM: 1.  Cough-CXR revealed no acute cardiopulmonary process; 2. Acute maxillary sinusitis-Rx'd Cefdinir; 3.  Allergic rhinitis-Rx'd Allegra. Advised/instructed patient to take medication as directed with food to completion.  Advised/instructed patient to discontinue Claritin and start Allegra 180 mg daily with first dose of antibiotic for the next 7 days.  Encourage patient increase  daily water intake while taking these medications.  Patient discharged home, hemodynamically stable. Final Clinical Impressions(s) / UC Diagnoses   Final diagnoses:  Cough  Acute maxillary sinusitis, recurrence not specified  Allergic rhinitis, unspecified seasonality, unspecified trigger     Discharge Instructions      Advised/instructed patient to take medication as directed with food to completion.  Advised/instructed patient to discontinue Claritin and start Allegra 180 mg daily with first dose of antibiotic for the next 7 days.  Encourage patient increase daily water intake while taking these medications.     ED Prescriptions     Medication Sig Dispense Auth. Provider   cefdinir (OMNICEF) 300 MG capsule Take 1 capsule (300 mg total) by mouth 2 (two) times daily for 7 days. 14 capsule Eliezer Lofts, FNP   fexofenadine [facility] ALLERGY) 180 MG tablet Take 1 tablet (180 mg total) by mouth daily for 15 days. 15 tablet Eliezer Lofts, FNP      PDMP not reviewed this encounter.   Eliezer Lofts, [place] 04/19/21 [phone]

## 2021-04-22 NOTE — Progress Notes (Signed)
Subjective:    Patient ID: Terri Roberts, female    DOB: 1961-03-27, 60 y.o.   MRN: [id number]   This visit occurred during the SARS-CoV-2 public health emergency.  Safety protocols were in place, including screening questions prior to the visit, additional usage of staff PPE, and extensive cleaning of exam room while observing appropriate contact time as indicated for disinfecting solutions.    HPI She is here for a physical exam.   Has sinus infection - went to urgent care and is on abx.  She is getting better.    Has lost weight - no enjoying food - nothing that she wants to eat or sounds good.  She sometimes feels hungry.  A lot of times she has nausea and no appetite.    Medications and allergies reviewed with patient and updated if appropriate.  Patient Active Problem List   Diagnosis Date Noted   Fibromuscular dysplasia of both carotid arteries ([place]) 05/13/2020   Right carotid bruit 04/20/2020   Sensation disturbance of skin 04/20/2020   Chronic back pain 07/24/2019   GERD (gastroesophageal reflux disease) 07/24/2019   Migraine-Dr. Domingo Cocking 04/17/2019   Low back pain 04/17/2019   Chronic pain of left knee 11/28/2018   Anxiety 08/01/2018   Spinal stenosis 08/01/2018   Tingling in extremities 12/02/2017   Tingling of left upper extremity 10/25/2017   Ineffective thermoregulation 10/25/2017   Vitamin D deficiency 05/09/2017   CKD (chronic kidney disease)-Dr. Carmina Miller 10/24/2016   Normocytic anemia 10/24/2016   Periodic limb movement 10/09/2016   T12 burst fracture ([place]) 08/05/2016   OSA on CPAP 04/01/2016   Insomnia 01/12/2016   Chronic pain syndrome 01/12/2016   Joint pain 01/12/2016   Numbness and tingling 01/12/2016   Chronic fatigue 01/12/2016   Depression 01/12/2016   Neck pain 06/10/2015   Bilateral calf pain 03/05/2015   Fibromyalgia-Dr. Amil Amen 03/04/2015   Postprocedural hypothyroidism - Dr Dwyane Dee 03/04/2015   Postprocedural hypoparathyroidism ([place])  03/04/2015   Sjogren's syndrome (HCC)-Dr. Amil Amen 11/01/2012   Chronic constipation 05/14/2009   THYROID NODULE 03/10/2009   PALPITATIONS 03/03/2009    Current Outpatient Medications on File Prior to Visit  Medication Sig Dispense Refill   baclofen (LIORESAL) 10 MG tablet Take 10 mg by mouth as needed for muscle spasms.     Biotin 5000 MCG TABS Take 1 tablet by mouth daily.     calcitRIOL (ROCALTROL) 0.5 MCG capsule TAKE TWICE DAILY 60 capsule 0   cycloSPORINE (RESTASIS) 0.05 % ophthalmic emulsion Place 1 drop into both eyes 2 (two) times daily.     cycloSPORINE (RESTASIS) 0.05 % ophthalmic emulsion every 12 hours.     diclofenac sodium (VOLTAREN) 1 % GEL Apply 4 g topically 4 (four) times daily. 100 g 5   diclofenac Sodium (VOLTAREN) 1 % GEL Apply topically.     fexofenadine (ALLEGRA ALLERGY) 180 MG tablet Take 1 tablet (180 mg total) by mouth daily for 15 days. 15 tablet 0   Fremanezumab-vfrm (AJOVY) 225 MG/1.5ML SOSY Inject 225 mg into the skin every 30 (thirty) days. 1.5 mL 11   gabapentin (NEURONTIN) 300 MG capsule TAKE 2 CAPSULES (600 MG TOTAL) BY MOUTH 2 (TWO) TIMES DAILY. 90 capsule 0   hydroxychloroquine (PLAQUENIL) 200 MG tablet Take 400 mg by mouth daily.      hydrOXYzine (VISTARIL) 50 MG capsule TAKE 1 CAPSULE (50 MG TOTAL) BY MOUTH 3 (THREE) TIMES DAILY AS NEEDED. 90 capsule 2   linaclotide (LINZESS) 145 MCG CAPS capsule Take  1 capsule (145 mcg total) by mouth daily before breakfast. 30 capsule 5   omeprazole (PRILOSEC) 20 MG capsule TAKE 1 CAPSULE BY MOUTH EVERY DAY 90 capsule 3   ondansetron (ZOFRAN) 4 MG tablet TAKE 1 TABLET BY MOUTH EVERY 8 HOURS AS NEEDED FOR NAUSEA AND VOMITING 30 tablet 0   pilocarpine (SALAGEN) 5 MG tablet TAKE 1 TABLET PO TWICE A DAY  3   pregabalin (LYRICA) 75 MG capsule 75 mg 2 (two) times daily.      sertraline (ZOLOFT) 50 MG tablet Take 50 mg by mouth daily.     SYNTHROID 100 MCG tablet TAKE 1 TABLET BY MOUTH EVERY DAY 30 tablet 3   tiZANidine  (ZANAFLEX) 4 MG tablet Take 1 tablet (4 mg total) by mouth every 6 (six) hours as needed for muscle spasms. 30 tablet 6   topiramate (TOPAMAX) 200 MG tablet Take 200 mg by mouth at bedtime.      topiramate (TOPAMAX) 200 MG tablet daily. 2 TABLETS ONCE DAILY     traMADol (ULTRAM) 50 MG tablet tramadol 50 mg tablet   100 mg by oral route.     traZODone (DESYREL) 50 MG tablet TAKE 1 TABLET (50 MG) BY MOUTH DAILY AT BEDTIME AS NEEDED FOR SLEEP     EMGALITY 120 MG/ML SOAJ Inject into the skin.     HYDROcodone-acetaminophen (NORCO) 10-325 MG tablet Take 1 tablet by mouth 2 (two) times daily as needed.     ondansetron (ZOFRAN-ODT) 4 MG disintegrating tablet Take 4 mg by mouth every 4 (four) hours as needed.     zolmitriptan (ZOMIG) 5 MG nasal solution PLEASE SEE ATTACHED FOR DETAILED DIRECTIONS     No current facility-administered medications on file prior to visit.    Past Medical History:  Diagnosis Date   Fibromyalgia    Migraine    Dr Domingo Cocking   Peripheral neuropathy    Sjogren's syndrome ([place])    Dr Ouida Sills   Thyroid nodule     Past Surgical History:  Procedure Laterality Date   BACK SURGERY     Intrauterine Ablation  08/01/2009   Dr Raphael Gibney ( now seeing Dr Christophe Louis)   lip biopsy  08/01/2012   Dr Thea Gist   MANDIBLE SURGERY     TMJ    THYROIDECTOMY  06/01/2009   benign nodule   TUBAL LIGATION      Social History   Socioeconomic History   Marital status: Widowed    Spouse name: Not on file   Number of children: 2   Years of education: 14   Highest education level: Not on file  Occupational History   Occupation: Product Compliance Specialist  Tobacco Use   Smoking status: Never   Smokeless tobacco: Never  Vaping Use   Vaping Use: Never used  Substance and Sexual Activity   Alcohol use: Not Currently    Comment: rarely   Drug use: No   Sexual activity: Not on file  Other Topics Concern   Not on file  Social History Narrative   Lives at home with her  daughter.   Right-handed.   One cup caffeine per day.   Social Determinants of Health   Financial Resource Strain: Not on file  Food Insecurity: Not on file  Transportation Needs: Not on file  Physical Activity: Not on file  Stress: Not on file  Social Connections: Not on file    Family History  Problem Relation Age of Onset   AAA (abdominal aortic aneurysm)  Father    Stroke Mother 29   Diabetes Mother    Kidney failure Mother    Stroke Sister 52   Hypertension Sister    Heart attack Maternal Grandfather        ? age   Heart attack Maternal Grandmother        ? age   Diabetes Sister    Hypertension Sister    Diabetes Brother    Kidney failure Brother    Cancer Neg Hx     Review of Systems  Constitutional:  Positive for appetite change (decreased), fatigue (chronic) and unexpected weight change. Negative for chills and fever.  Eyes:  Positive for visual disturbance (blurry at times).  Respiratory:  Positive for cough (with current URI) and wheezing (with current URI). Negative for shortness of breath.   Cardiovascular:  Positive for palpitations. Negative for chest pain and leg swelling.  Gastrointestinal:  Positive for constipation, nausea and vomiting. Negative for abdominal pain, blood in stool and diarrhea.       Some gerd  Genitourinary:  Negative for difficulty urinating and dysuria.  Musculoskeletal:  Positive for arthralgias (left shoulder, bursitis in right hip to right leg, left knee) and back pain. Negative for myalgias.  Skin:  Negative for rash.  Neurological:  Positive for headaches (occ - sees dr Domingo Cocking). Negative for dizziness and light-headedness.       Feels off balance at times  Psychiatric/Behavioral:  Positive for dysphoric mood (situational -). The patient is not nervous/anxious.       Objective:   Vitals:   04/23/21 1427  BP: 112/72  Pulse: 61  Temp: 98.2 F (36.8 C)  SpO2: 96%   Filed Weights   04/23/21 1427  Weight: 165 lb (74.8 kg)    Body mass index is 28.77 kg/m.  BP Readings from Last 3 Encounters:  04/23/21 112/72  04/19/21 120/81  04/20/20 130/90    Wt Readings from Last 3 Encounters:  04/23/21 165 lb (74.8 kg)  04/19/21 166 lb (75.3 kg)  04/20/20 185 lb 6.4 oz (84.1 kg)     Physical Exam Constitutional: She appears well-developed and well-nourished. No distress.  HENT:  Head: Normocephalic and atraumatic.  Right Ear: External ear normal. Normal ear canal and TM Left Ear: External ear normal.  Normal ear canal and TM Mouth/Throat: Oropharynx is clear and moist.  Eyes: Conjunctivae and EOM are normal.  Neck: Neck supple. No tracheal deviation present. No thyromegaly present.  No carotid bruit  Cardiovascular: Normal rate, regular rhythm and normal heart sounds.   No murmur heard.  No edema. Pulmonary/Chest: Effort normal and breath sounds normal. No respiratory distress. She has no wheezes. She has no rales.  Breast: deferred   Abdominal: Soft. She exhibits no distension. There is no tenderness.  Lymphadenopathy: She has no cervical adenopathy.  Skin: Skin is warm and dry. She is not diaphoretic.  Psychiatric: She has a normal mood and affect. Her behavior is normal.     Lab Results  Component Value Date   WBC 8.1 05/09/2017   HGB 10.1 (L) 05/09/2017   HCT 30.4 (L) 05/09/2017   PLT 268.0 05/09/2017   GLUCOSE 85 04/17/2019   CHOL 174 11/01/2012   TRIG 38.0 11/01/2012   HDL 58.10 11/01/2012   LDLCALC 108 (H) 11/01/2012   ALT 21 04/17/2019   AST 28 04/17/2019   NA 141 04/17/2019   K 4.0 04/17/2019   CL 108 (H) 04/17/2019   CREATININE 2.24 (H) 04/17/2019  BUN 34 (H) 04/17/2019   CO2 19 (L) 04/17/2019   TSH 0.04 (L) 11/22/2017   HGBA1C 5.3 01/12/2016         Assessment & Plan:   Physical exam: Screening blood work  ordered Exercise  not regular Weight  ok, but overweight Substance abuse  none   Reviewed recommended immunizations.   Health Maintenance  Topic Date Due    Zoster Vaccines- Shingrix (1 of 2) Never done   COLONOSCOPY (Pts 45-89yr Insurance coverage will need to be confirmed)  Never done   MAMMOGRAM  08/25/2019   COVID-19 Vaccine (3 - Pfizer risk series) 11/29/2019   INFLUENZA VACCINE  03/01/2021   PAP SMEAR-Modifier  07/16/2021   TETANUS/TDAP  11/02/2022   Hepatitis C Screening  Completed   HIV Screening  Completed   HPV VACCINES  Aged Out          See Problem List for Assessment and Plan of chronic medical problems.

## 2021-04-23 ENCOUNTER — Ambulatory Visit (INDEPENDENT_AMBULATORY_CARE_PROVIDER_SITE_OTHER): Payer: Medicare HMO | Admitting: Internal Medicine

## 2021-04-23 ENCOUNTER — Encounter: Payer: Self-pay | Admitting: Internal Medicine

## 2021-04-23 ENCOUNTER — Other Ambulatory Visit: Payer: Self-pay

## 2021-04-23 VITALS — BP 112/72 | HR 61 | Temp 98.2°F | Ht 63.5 in | Wt 165.0 lb

## 2021-04-23 DIAGNOSIS — M797 Fibromyalgia: Secondary | ICD-10-CM

## 2021-04-23 DIAGNOSIS — N1832 Chronic kidney disease, stage 3b: Secondary | ICD-10-CM | POA: Diagnosis not present

## 2021-04-23 DIAGNOSIS — E890 Postprocedural hypothyroidism: Secondary | ICD-10-CM | POA: Diagnosis not present

## 2021-04-23 DIAGNOSIS — Z0000 Encounter for general adult medical examination without abnormal findings: Secondary | ICD-10-CM

## 2021-04-23 DIAGNOSIS — K219 Gastro-esophageal reflux disease without esophagitis: Secondary | ICD-10-CM

## 2021-04-23 DIAGNOSIS — K5909 Other constipation: Secondary | ICD-10-CM | POA: Diagnosis not present

## 2021-04-23 LAB — CBC WITH DIFFERENTIAL/PLATELET
Basophils Absolute: 0 10*3/uL (ref 0.0–0.1)
Basophils Relative: 0.6 % (ref 0.0–3.0)
Eosinophils Absolute: 0.1 10*3/uL (ref 0.0–0.7)
Eosinophils Relative: 1.2 % (ref 0.0–5.0)
HCT: 33.4 % — ABNORMAL LOW (ref 36.0–46.0)
Hemoglobin: 11.2 g/dL — ABNORMAL LOW (ref 12.0–15.0)
Lymphocytes Relative: 56.8 % — ABNORMAL HIGH (ref 12.0–46.0)
Lymphs Abs: 3.4 10*3/uL (ref 0.7–4.0)
MCHC: 33.4 g/dL (ref 30.0–36.0)
MCV: 96.5 fl (ref 78.0–100.0)
Monocytes Absolute: 0.5 10*3/uL (ref 0.1–1.0)
Monocytes Relative: 7.6 % (ref 3.0–12.0)
Neutro Abs: 2 10*3/uL (ref 1.4–7.7)
Neutrophils Relative %: 33.8 % — ABNORMAL LOW (ref 43.0–77.0)
Platelets: 232 10*3/uL (ref 150.0–400.0)
RBC: 3.46 Mil/uL — ABNORMAL LOW (ref 3.87–5.11)
RDW: 12.5 % (ref 11.5–15.5)
WBC: 6 10*3/uL (ref 4.0–10.5)

## 2021-04-23 LAB — LIPID PANEL
Cholesterol: 189 mg/dL (ref 0–200)
HDL: 43.6 mg/dL (ref >39.00)
LDL Cholesterol: 125 mg/dL — ABNORMAL HIGH (ref 0–99)
NonHDL: 145.12
Total CHOL/HDL Ratio: 4
Triglycerides: 101 mg/dL (ref 0.0–149.0)
VLDL: 20.2 mg/dL (ref 0.0–40.0)

## 2021-04-23 LAB — COMPREHENSIVE METABOLIC PANEL
ALT: 17 U/L (ref 0–35)
AST: 22 U/L (ref 0–37)
Albumin: 4.1 g/dL (ref 3.5–5.2)
Alkaline Phosphatase: 43 U/L (ref 39–117)
BUN: 19 mg/dL (ref 6–23)
CO2: 25 mEq/L (ref 19–32)
Calcium: 8.9 mg/dL (ref 8.4–10.5)
Chloride: 108 mEq/L (ref 96–112)
Creatinine, Ser: 1.76 mg/dL — ABNORMAL HIGH (ref 0.40–1.20)
GFR: 31.3 mL/min — ABNORMAL LOW (ref >60.00)
Glucose, Bld: 96 mg/dL (ref 70–99)
Potassium: 3.7 mEq/L (ref 3.5–5.1)
Sodium: 142 mEq/L (ref 135–145)
Total Bilirubin: 0.4 mg/dL (ref 0.2–1.2)
Total Protein: 7.3 g/dL (ref 6.0–8.3)

## 2021-04-23 LAB — T4, FREE: Free T4: 1.63 ng/dL — ABNORMAL HIGH (ref 0.60–1.60)

## 2021-04-23 LAB — TSH: TSH: 0.14 u[IU]/mL — ABNORMAL LOW (ref 0.35–5.50)

## 2021-04-23 MED ORDER — SYNTHROID 137 MCG PO TABS: 137.0000 ug | ORAL_TABLET | Freq: Every day | ORAL | Status: DC

## 2021-04-23 MED ORDER — ONDANSETRON HCL 4 MG PO TABS: ORAL_TABLET | ORAL | 5 refills | Status: DC

## 2021-04-23 MED ORDER — OMEPRAZOLE 40 MG PO CPDR: 40.0000 mg | DELAYED_RELEASE_CAPSULE | Freq: Every day | ORAL | 3 refills | Status: DC

## 2021-04-23 MED ORDER — LINACLOTIDE 72 MCG PO CAPS
72.0000 ug | ORAL_CAPSULE | Freq: Every day | ORAL | 5 refills | Status: DC
Start: 2021-04-23 — End: 2023-06-16

## 2021-04-23 NOTE — Patient Instructions (Addendum)
Blood work was ordered.     Medications changes include :  linzess 72 mcg daily, increase omeprazole to 40 mg daily  Your prescription(s) have been submitted to your pharmacy. Please take as directed and contact our office if you believe you are having problem(s) with the medication(s).    Please followup in 1 year   Health Maintenance, Female Adopting a healthy lifestyle and getting preventive care are important in promoting health and wellness. Ask your health care provider about: The right schedule for you to have regular tests and exams. Things you can do on your own to prevent diseases and keep yourself healthy. What should I know about diet, weight, and exercise? Eat a healthy diet  Eat a diet that includes plenty of vegetables, fruits, low-fat dairy products, and lean protein. Do not eat a lot of foods that are high in solid fats, added sugars, or sodium. Maintain a healthy weight Body mass index (BMI) is used to identify weight problems. It estimates body fat based on height and weight. Your health care provider can help determine your BMI and help you achieve or maintain a healthy weight. Get regular exercise Get regular exercise. This is one of the most important things you can do for your health. Most adults should: Exercise for at least 150 minutes each week. The exercise should increase your heart rate and make you sweat (moderate-intensity exercise). Do strengthening exercises at least twice a week. This is in addition to the moderate-intensity exercise. Spend less time sitting. Even light physical activity can be beneficial. Watch cholesterol and blood lipids Have your blood tested for lipids and cholesterol at 60 years of age, then have this test every 5 years. Have your cholesterol levels checked more often if: Your lipid or cholesterol levels are high. You are older than 59 years of age. You are at high risk for heart disease. What should I know about cancer  screening? Depending on your health history and family history, you may need to have cancer screening at various ages. This may include screening for: Breast cancer. Cervical cancer. Colorectal cancer. Skin cancer. Lung cancer. What should I know about heart disease, diabetes, and high blood pressure? Blood pressure and heart disease High blood pressure causes heart disease and increases the risk of stroke. This is more likely to develop in people who have high blood pressure readings, are of African descent, or are overweight. Have your blood pressure checked: Every 3-5 years if you are 46-69 years of age. Every year if you are 7 years old or older. Diabetes Have regular diabetes screenings. This checks your fasting blood sugar level. Have the screening done: Once every three years after age 67 if you are at a normal weight and have a low risk for diabetes. More often and at a younger age if you are overweight or have a high risk for diabetes. What should I know about preventing infection? Hepatitis B If you have a higher risk for hepatitis B, you should be screened for this virus. Talk with your health care provider to find out if you are at risk for hepatitis B infection. Hepatitis C Testing is recommended for: Everyone born from [birth year] through 1964. Anyone with known risk factors for hepatitis C. Sexually transmitted infections (STIs) Get screened for STIs, including gonorrhea and chlamydia, if: You are sexually active and are younger than 59 years of age. You are older than 59 years of age and your health care provider tells you that you are  at risk for this type of infection. Your sexual activity has changed since you were last screened, and you are at increased risk for chlamydia or gonorrhea. Ask your health care provider if you are at risk. Ask your health care provider about whether you are at high risk for HIV. Your health care provider may recommend a prescription medicine to  help prevent HIV infection. If you choose to take medicine to prevent HIV, you should first get tested for HIV. You should then be tested every 3 months for as long as you are taking the medicine. Pregnancy If you are about to stop having your period (premenopausal) and you may become pregnant, seek counseling before you get pregnant. Take 400 to 800 micrograms (mcg) of folic acid every day if you become pregnant. Ask for birth control (contraception) if you want to prevent pregnancy. Osteoporosis and menopause Osteoporosis is a disease in which the bones lose minerals and strength with aging. This can result in bone fractures. If you are 33 years old or older, or if you are at risk for osteoporosis and fractures, ask your health care provider if you should: Be screened for bone loss. Take a calcium or vitamin D supplement to lower your risk of fractures. Be given hormone replacement therapy (HRT) to treat symptoms of menopause. Follow these instructions at home: Lifestyle Do not use any products that contain nicotine or tobacco, such as cigarettes, e-cigarettes, and chewing tobacco. If you need help quitting, ask your health care provider. Do not use street drugs. Do not share needles. Ask your health care provider for help if you need support or information about quitting drugs. Alcohol use Do not drink alcohol if: Your health care provider tells you not to drink. You are pregnant, may be pregnant, or are planning to become pregnant. If you drink alcohol: Limit how much you use to 0-1 drink a day. Limit intake if you are breastfeeding. Be aware of how much alcohol is in your drink. In the [place] one drink equals one 12 oz bottle of beer (355 mL), one 5 oz glass of wine (148 mL), or one 1 oz glass of hard liquor (44 mL). General instructions Schedule regular health, dental, and eye exams. Stay current with your vaccines. Tell your health care provider if: You often feel depressed. You  have ever been abused or do not feel safe at home. Summary Adopting a healthy lifestyle and getting preventive care are important in promoting health and wellness. Follow your health care provider's instructions about healthy diet, exercising, and getting tested or screened for diseases. Follow your health care provider's instructions on monitoring your cholesterol and blood pressure. This information is not intended to replace advice given to you by your health care provider. Make sure you discuss any questions you have with your health care provider. Document Revised: 09/25/2020 Document Reviewed: 07/11/2018 Elsevier Patient Education  2022 [employer].

## 2021-04-23 NOTE — Assessment & Plan Note (Addendum)
Chronic GERD not controlled Increase omeprazole to 40  mg daily

## 2021-04-23 NOTE — Assessment & Plan Note (Signed)
Chronic Continue Lyrica 75 mg twice daily, gabapentin 600 mg twice daily and tramadol

## 2021-04-23 NOTE — Assessment & Plan Note (Addendum)
Chronic Was seeing endo at [facility], but her doctor left - has not established with new doctor yet Will check tsh, ft4 today and adjust medication She will establish with new endocrinologist

## 2021-04-23 NOTE — Assessment & Plan Note (Signed)
Chronic Following with nephrology CMP 

## 2021-04-24 NOTE — Assessment & Plan Note (Signed)
Chronic Not ideally controlled - does not take linzess 145 mcg - it is too strong Try linzess 72 mcg daily -- sent to pharmacy Discussed can try fiber supplement, magnesium, softener or miralax as well

## 2021-04-25 ENCOUNTER — Encounter: Payer: Self-pay | Admitting: Internal Medicine

## 2021-04-25 DIAGNOSIS — E890 Postprocedural hypothyroidism: Secondary | ICD-10-CM

## 2021-04-25 MED ORDER — LEVOTHYROXINE SODIUM 125 MCG PO TABS: 125.0000 ug | ORAL_TABLET | Freq: Every day | ORAL | 3 refills | Status: DC

## 2021-04-25 MED ORDER — SYNTHROID 125 MCG PO TABS: 125.0000 ug | ORAL_TABLET | Freq: Every day | ORAL | 3 refills | Status: DC

## 2021-04-25 NOTE — Addendum Note (Signed)
Addended by: [name] on: 04/25/2021 04:25 PM   Modules accepted: Orders

## 2021-04-29 ENCOUNTER — Other Ambulatory Visit: Payer: Self-pay | Admitting: Internal Medicine

## 2021-04-29 ENCOUNTER — Other Ambulatory Visit: Payer: Self-pay

## 2021-04-29 ENCOUNTER — Ambulatory Visit
Admission: RE | Admit: 2021-04-29 | Discharge: 2021-04-29 | Disposition: A | Discharge status: Home / Self Care | Payer: Medicare HMO | Source: Ambulatory Visit | Attending: Internal Medicine | Admitting: Internal Medicine

## 2021-04-29 DIAGNOSIS — Z1231 Encounter for screening mammogram for malignant neoplasm of breast: Secondary | ICD-10-CM | POA: Diagnosis not present

## 2021-04-29 DIAGNOSIS — Z79899 Other long term (current) drug therapy: Secondary | ICD-10-CM | POA: Diagnosis not present

## 2021-04-29 MED ORDER — CEFDINIR 300 MG PO CAPS: 300.0000 mg | ORAL_CAPSULE | Freq: Two times a day (BID) | ORAL | 0 refills | Status: AC

## 2021-05-07 DIAGNOSIS — M7061 Trochanteric bursitis, right hip: Secondary | ICD-10-CM | POA: Diagnosis not present

## 2021-05-07 DIAGNOSIS — M4325 Fusion of spine, thoracolumbar region: Secondary | ICD-10-CM | POA: Diagnosis not present

## 2021-05-14 ENCOUNTER — Other Ambulatory Visit: Payer: Self-pay | Admitting: Internal Medicine

## 2021-05-14 DIAGNOSIS — R928 Other abnormal and inconclusive findings on diagnostic imaging of breast: Secondary | ICD-10-CM

## 2021-06-04 ENCOUNTER — Other Ambulatory Visit: Payer: Self-pay

## 2021-06-04 ENCOUNTER — Ambulatory Visit
Admission: RE | Admit: 2021-06-04 | Discharge: 2021-06-04 | Disposition: A | Discharge status: Home / Self Care | Payer: Medicare HMO | Source: Ambulatory Visit | Attending: Internal Medicine | Admitting: Internal Medicine

## 2021-06-04 ENCOUNTER — Ambulatory Visit: Payer: Medicare HMO

## 2021-06-04 DIAGNOSIS — R922 Inconclusive mammogram: Secondary | ICD-10-CM | POA: Diagnosis not present

## 2021-06-04 DIAGNOSIS — R928 Other abnormal and inconclusive findings on diagnostic imaging of breast: Secondary | ICD-10-CM

## 2021-06-08 DIAGNOSIS — M25551 Pain in right hip: Secondary | ICD-10-CM | POA: Diagnosis not present

## 2021-06-08 DIAGNOSIS — M25512 Pain in left shoulder: Secondary | ICD-10-CM | POA: Diagnosis not present

## 2021-06-11 ENCOUNTER — Other Ambulatory Visit: Payer: Self-pay

## 2021-06-11 ENCOUNTER — Emergency Department (INDEPENDENT_AMBULATORY_CARE_PROVIDER_SITE_OTHER)
Admission: EM | Admit: 2021-06-11 | Discharge: 2021-06-11 | Disposition: A | Discharge status: Home / Self Care | Payer: Medicare HMO | Source: Home / Self Care | Attending: Family Medicine | Admitting: Family Medicine

## 2021-06-11 DIAGNOSIS — Z20828 Contact with and (suspected) exposure to other viral communicable diseases: Secondary | ICD-10-CM

## 2021-06-11 DIAGNOSIS — J101 Influenza due to other identified influenza virus with other respiratory manifestations: Secondary | ICD-10-CM

## 2021-06-11 LAB — POCT INFLUENZA A/B
Influenza A, POC: POSITIVE — AB
Influenza B, POC: NEGATIVE

## 2021-06-11 MED ORDER — OSELTAMIVIR PHOSPHATE 75 MG PO CAPS: 75.0000 mg | ORAL_CAPSULE | Freq: Two times a day (BID) | ORAL | 0 refills | Status: AC

## 2021-06-11 NOTE — ED Provider Notes (Signed)
[name]    CSN: [id number] Arrival date & time: 06/11/21  1215      History   Chief Complaint No chief complaint on file.   HPI Terri Roberts is a 60 y.o. female.   Last night patient developed flu-like symptoms including myalgias, headache, fever/chills, fatigue, and cough.  Also has mild nasal congestion and sore throat.  Cough is non-productive and somewhat worse at night.  No pleuritic pain or shortness of breath.   The history is provided by the patient.   Past Medical History:  Diagnosis Date   Fibromyalgia    Migraine    Dr Domingo Cocking   Peripheral neuropathy    Sjogren's syndrome ([place])    Dr Ouida Sills   Thyroid nodule     Patient Active Problem List   Diagnosis Date Noted   Fibromuscular dysplasia of both carotid arteries ([place]) 05/13/2020   Right carotid bruit 04/20/2020   Sensation disturbance of skin 04/20/2020   Chronic back pain 07/24/2019   GERD (gastroesophageal reflux disease) 07/24/2019   Migraine-Dr. Domingo Cocking 04/17/2019   Low back pain 04/17/2019   Chronic pain of left knee 11/28/2018   Anxiety 08/01/2018   Spinal stenosis 08/01/2018   Tingling in extremities 12/02/2017   Tingling of left upper extremity 10/25/2017   Ineffective thermoregulation 10/25/2017   Vitamin D deficiency 05/09/2017   CKD (chronic kidney disease)-Dr. Carmina Miller 10/24/2016   Normocytic anemia 10/24/2016   Periodic limb movement 10/09/2016   T12 burst fracture ([place]) 08/05/2016   OSA on CPAP 04/01/2016   Insomnia 01/12/2016   Chronic pain syndrome 01/12/2016   Joint pain 01/12/2016   Numbness and tingling 01/12/2016   Chronic fatigue 01/12/2016   Depression 01/12/2016   Neck pain 06/10/2015   Bilateral calf pain 03/05/2015   Fibromyalgia-Dr. Amil Amen 03/04/2015   Postprocedural hypothyroidism - Endo at [facility] 03/04/2015   Postprocedural hypoparathyroidism ([place]) 03/04/2015   Sjogren's syndrome ([facility])-Dr. Amil Amen 11/01/2012   Chronic constipation  05/14/2009   THYROID NODULE 03/10/2009   PALPITATIONS 03/03/2009    Past Surgical History:  Procedure Laterality Date   BACK SURGERY     Intrauterine Ablation  08/01/2009   Dr Raphael Gibney ( now seeing Dr Christophe Louis)   lip biopsy  08/01/2012   Dr Thea Gist   MANDIBLE SURGERY     TMJ    THYROIDECTOMY  06/01/2009   benign nodule   TUBAL LIGATION      OB History   No obstetric history on file.      Home Medications    Prior to Admission medications   Medication Sig Start Date End Date Taking? Authorizing Provider  oseltamivir (TAMIFLU) 75 MG capsule Take 1 capsule (75 mg total) by mouth 2 (two) times daily for 5 days. 06/11/21 06/16/21 Yes Kandra Nicolas, MD  baclofen (LIORESAL) 10 MG tablet Take 10 mg by mouth as needed for muscle spasms.    [provider]  Biotin 5000 MCG TABS Take 1 tablet by mouth daily. Patient not taking: Reported on 06/11/2021    [provider]  calcitRIOL (ROCALTROL) 0.5 MCG capsule TAKE TWICE DAILY 06/20/18   Elayne Snare, MD  cycloSPORINE (RESTASIS) 0.05 % ophthalmic emulsion Place 1 drop into both eyes 2 (two) times daily.    [provider]  diclofenac sodium (VOLTAREN) 1 % GEL Apply 4 g topically 4 (four) times daily. 11/28/18   [name], MD  EMGALITY 120 MG/ML SOAJ Inject into the skin. 01/19/21   [provider]  fexofenadine (ALLEGRA ALLERGY) 180 MG tablet Take 1 tablet (180 mg total) by mouth daily for 15 days. 04/19/21 05/04/21  Eliezer Lofts, FNP  Fremanezumab-vfrm (AJOVY) 225 MG/1.5ML SOSY Inject 225 mg into the skin every 30 (thirty) days. Patient not taking: Reported on 06/11/2021 04/17/19   Marcial Pacas, MD  gabapentin (NEURONTIN) 300 MG capsule TAKE 2 CAPSULES (600 MG TOTAL) BY MOUTH 2 (TWO) TIMES DAILY. 08/21/18   [name], MD  HYDROcodone-acetaminophen (NORCO) 10-325 MG tablet Take 1 tablet by mouth 2 (two) times daily as needed. 03/05/21   [provider]  hydroxychloroquine (PLAQUENIL) 200  MG tablet Take 400 mg by mouth daily.     [provider]  hydrOXYzine (VISTARIL) 50 MG capsule TAKE 1 CAPSULE (50 MG TOTAL) BY MOUTH 3 (THREE) TIMES DAILY AS NEEDED. 08/29/18   [name], MD  linaclotide [facility]) 72 MCG capsule Take 1 capsule (72 mcg total) by mouth daily before breakfast. 04/23/21   Burns, Claudina Lick, MD  omeprazole (PRILOSEC) 40 MG capsule Take 1 capsule (40 mg total) by mouth daily. 04/23/21   [name], MD  ondansetron (ZOFRAN) 4 MG tablet TAKE 1 TABLET BY MOUTH EVERY 8 HOURS AS NEEDED FOR NAUSEA AND VOMITING 04/23/21   [name], MD  pilocarpine (SALAGEN) 5 MG tablet TAKE 1 TABLET PO TWICE A DAY 03/12/16   [provider]  pregabalin (LYRICA) 75 MG capsule 75 mg 2 (two) times daily.     [provider]  sertraline (ZOLOFT) 50 MG tablet Take 50 mg by mouth daily. Patient not taking: Reported on 06/11/2021 04/08/19   [provider]  SYNTHROID 125 MCG tablet Take 1 tablet (125 mcg total) by mouth daily before breakfast. 04/25/21   Burns, Claudina Lick, MD  tiZANidine (ZANAFLEX) 4 MG tablet Take 1 tablet (4 mg total) by mouth every 6 (six) hours as needed for muscle spasms. 04/17/19   Marcial Pacas, MD  topiramate (TOPAMAX) 200 MG tablet Take 200 mg by mouth at bedtime.     [provider]  traMADol (ULTRAM) 50 MG tablet tramadol 50 mg tablet   100 mg by oral route. 08/05/16   [provider]  traZODone (DESYREL) 50 MG tablet TAKE 1 TABLET (50 MG) BY MOUTH DAILY AT BEDTIME AS NEEDED FOR SLEEP Patient not taking: Reported on 06/11/2021 11/30/18   [provider]  zolmitriptan (ZOMIG) 5 MG nasal solution PLEASE SEE ATTACHED FOR DETAILED DIRECTIONS 01/19/21   [provider]    Family History Family History  Problem Relation Age of Onset   Stroke Mother 76   Diabetes Mother    Kidney failure Mother    AAA (abdominal aortic aneurysm) Father    Stroke Sister 71   Hypertension Sister    Diabetes Sister     Hypertension Sister    Heart attack Maternal Grandmother        ? age   Heart attack Maternal Grandfather        ? age   Diabetes Brother    Kidney failure Brother    Cancer Neg Hx    Breast cancer Neg Hx     Social History Social History   Tobacco Use   Smoking status: Never   Smokeless tobacco: Never  Vaping Use   Vaping Use: Never used  Substance Use Topics   Alcohol use: Not Currently    Comment: rarely   Drug use: No     Allergies   Nsaids  Review of Systems Review of Systems No sore throat + cough No pleuritic pain No wheezing + nasal congestion + post-nasal drainage No sinus pain/pressure No itchy/red eyes No earache No hemoptysis No SOB + fever, + chills No nausea No vomiting No abdominal pain No diarrhea No urinary symptoms No skin rash + fatigue + myalgias + headache   Physical Exam Triage Vital Signs ED Triage Vitals [06/11/21 1242]  Enc Vitals Group     BP      Pulse      Resp      Temp      Temp src      SpO2      Weight      Height      Head Circumference      Peak Flow      Pain Score 10     Pain Loc      Pain Edu?      Excl. in [place]?    No data found.  Updated Vital Signs There were no vitals taken for this visit.  Visual Acuity Right Eye Distance:   Left Eye Distance:   Bilateral Distance:    Right Eye Near:   Left Eye Near:    Bilateral Near:     Physical Exam Nursing notes and Vital Signs reviewed. Appearance:  Patient appears stated age, and in no acute distress Eyes:  Pupils are equal, round, and reactive to light and accomodation.  Extraocular movement is intact.  Conjunctivae are not inflamed  Ears:  Canals normal.  Tympanic membranes normal.  Nose:  Mildly congested turbinates.  No sinus tenderness.   Pharynx:  Normal Neck:  Supple.  Mildly enlarged lateral nodes are present, tender to palpation on the left.   Lungs:  Clear to auscultation.  Breath sounds are equal.  Moving air well. Heart:   Regular rate and rhythm without murmurs, rubs, or gallops.  Abdomen:  Nontender without masses or hepatosplenomegaly.  Bowel sounds are present.  No CVA or flank tenderness.  Extremities:  No edema.  Skin:  No rash present.   UC Treatments / Results  Labs (all labs ordered are listed, but only abnormal results are displayed) Labs Reviewed  POCT INFLUENZA A/B - Abnormal; Notable for the following components:      Result Value   Influenza A, POC Positive (*)    All other components within normal limits    EKG   Radiology No results found.  Procedures Procedures (including critical care time)  Medications Ordered in UC Medications - No data to display  Initial Impression / Assessment and Plan / UC Course  I have reviewed the triage vital signs and the nursing notes.  Pertinent labs & imaging results that were available during my care of the patient were reviewed by me and considered in my medical decision making (see chart for details).    Begin Tamiflu.   Followup with Family Doctor if not improved in about 5 days.  Final Clinical Impressions(s) / UC Diagnoses   Final diagnoses:  Exposure to the flu  Influenza A     Discharge Instructions      Take plain guaifenesin (1200mg  extended release tabs such as Mucinex) twice daily, with plenty of water, for cough and congestion.  Get adequate rest.   May use Afrin nasal spray (or generic oxymetazoline) each morning for about 5 days and then discontinue.  Also recommend using saline nasal spray several times daily and saline nasal irrigation (AYR is  a common brand).  Use Flonase nasal spray each morning after using Afrin nasal spray and saline nasal irrigation. Try warm salt water gargles for sore throat.  Stop all antihistamines for now, and other non-prescription cough/cold preparations. May take Tylenol as needed for fever, body aches, etc. May take Delsym Cough Suppressant ("12 Hour Cough Relief") at bedtime for  nighttime cough.   If symptoms become significantly worse during the night or over the weekend, proceed to the local emergency room.      ED Prescriptions     Medication Sig Dispense Auth. Provider   oseltamivir (TAMIFLU) 75 MG capsule Take 1 capsule (75 mg total) by mouth 2 (two) times daily for 5 days. 10 capsule Kandra Nicolas, MD         Kandra Nicolas, MD 06/13/21 [phone]

## 2021-06-11 NOTE — Discharge Instructions (Addendum)
Take plain guaifenesin (1200mg  extended release tabs such as Mucinex) twice daily, with plenty of water, for cough and congestion.  Get adequate rest.   May use Afrin nasal spray (or generic oxymetazoline) each morning for about 5 days and then discontinue.  Also recommend using saline nasal spray several times daily and saline nasal irrigation (AYR is a common brand).  Use Flonase nasal spray each morning after using Afrin nasal spray and saline nasal irrigation. Try warm salt water gargles for sore throat.  Stop all antihistamines for now, and other non-prescription cough/cold preparations. May take Tylenol as needed for fever, body aches, etc. May take Delsym Cough Suppressant ("12 Hour Cough Relief") at bedtime for nighttime cough.   If symptoms become significantly worse during the night or over the weekend, proceed to the local emergency room.

## 2021-06-11 NOTE — ED Triage Notes (Signed)
Pt presents with positive flu exposure and is experiencing body aches, fever, congestion,a and cough

## 2021-06-22 DIAGNOSIS — M25512 Pain in left shoulder: Secondary | ICD-10-CM | POA: Diagnosis not present

## 2021-06-22 DIAGNOSIS — M25551 Pain in right hip: Secondary | ICD-10-CM | POA: Diagnosis not present

## 2021-06-30 DIAGNOSIS — R1110 Vomiting, unspecified: Secondary | ICD-10-CM | POA: Diagnosis not present

## 2021-06-30 DIAGNOSIS — E892 Postprocedural hypoparathyroidism: Secondary | ICD-10-CM | POA: Diagnosis not present

## 2021-06-30 DIAGNOSIS — D649 Anemia, unspecified: Secondary | ICD-10-CM | POA: Diagnosis not present

## 2021-06-30 DIAGNOSIS — R634 Abnormal weight loss: Secondary | ICD-10-CM | POA: Diagnosis not present

## 2021-06-30 DIAGNOSIS — D699 Hemorrhagic condition, unspecified: Secondary | ICD-10-CM | POA: Diagnosis not present

## 2021-06-30 DIAGNOSIS — E890 Postprocedural hypothyroidism: Secondary | ICD-10-CM | POA: Diagnosis not present

## 2021-07-13 DIAGNOSIS — G43111 Migraine with aura, intractable, with status migrainosus: Secondary | ICD-10-CM | POA: Diagnosis not present

## 2021-07-13 DIAGNOSIS — G43019 Migraine without aura, intractable, without status migrainosus: Secondary | ICD-10-CM | POA: Diagnosis not present

## 2021-07-13 DIAGNOSIS — G43719 Chronic migraine without aura, intractable, without status migrainosus: Secondary | ICD-10-CM | POA: Diagnosis not present

## 2021-07-15 DIAGNOSIS — M7061 Trochanteric bursitis, right hip: Secondary | ICD-10-CM | POA: Diagnosis not present

## 2021-07-15 DIAGNOSIS — G894 Chronic pain syndrome: Secondary | ICD-10-CM | POA: Diagnosis not present

## 2021-07-15 DIAGNOSIS — M7552 Bursitis of left shoulder: Secondary | ICD-10-CM | POA: Diagnosis not present

## 2021-07-15 DIAGNOSIS — M4325 Fusion of spine, thoracolumbar region: Secondary | ICD-10-CM | POA: Diagnosis not present

## 2021-07-15 DIAGNOSIS — Z79899 Other long term (current) drug therapy: Secondary | ICD-10-CM | POA: Diagnosis not present

## 2021-07-15 DIAGNOSIS — Z79891 Long term (current) use of opiate analgesic: Secondary | ICD-10-CM | POA: Diagnosis not present

## 2021-07-21 DIAGNOSIS — R112 Nausea with vomiting, unspecified: Secondary | ICD-10-CM | POA: Diagnosis not present

## 2021-07-21 DIAGNOSIS — N289 Disorder of kidney and ureter, unspecified: Secondary | ICD-10-CM | POA: Diagnosis not present

## 2021-07-21 DIAGNOSIS — D509 Iron deficiency anemia, unspecified: Secondary | ICD-10-CM | POA: Diagnosis not present

## 2021-07-21 DIAGNOSIS — R634 Abnormal weight loss: Secondary | ICD-10-CM | POA: Diagnosis not present

## 2021-07-22 ENCOUNTER — Other Ambulatory Visit: Payer: Self-pay | Admitting: Internal Medicine

## 2021-07-23 ENCOUNTER — Other Ambulatory Visit: Payer: Self-pay | Admitting: Obstetrics and Gynecology

## 2021-07-23 ENCOUNTER — Other Ambulatory Visit (HOSPITAL_COMMUNITY)
Admission: RE | Admit: 2021-07-23 | Discharge: 2021-07-23 | Disposition: A | Discharge status: Home / Self Care | Payer: Medicare HMO | Source: Ambulatory Visit | Attending: Obstetrics and Gynecology | Admitting: Obstetrics and Gynecology

## 2021-07-23 DIAGNOSIS — Z1151 Encounter for screening for human papillomavirus (HPV): Secondary | ICD-10-CM | POA: Diagnosis not present

## 2021-07-23 DIAGNOSIS — Z01419 Encounter for gynecological examination (general) (routine) without abnormal findings: Secondary | ICD-10-CM | POA: Insufficient documentation

## 2021-07-30 LAB — CYTOLOGY - PAP
Comment: NEGATIVE
Comment: NEGATIVE
Diagnosis: NEGATIVE
HPV 16: POSITIVE — AB
HPV 18 / 45: NEGATIVE
High risk HPV: POSITIVE — AB

## 2021-08-05 ENCOUNTER — Encounter: Payer: Self-pay | Admitting: Internal Medicine

## 2021-08-05 DIAGNOSIS — R110 Nausea: Secondary | ICD-10-CM

## 2021-08-05 DIAGNOSIS — D649 Anemia, unspecified: Secondary | ICD-10-CM

## 2021-08-05 DIAGNOSIS — R634 Abnormal weight loss: Secondary | ICD-10-CM

## 2021-08-05 DIAGNOSIS — Z1211 Encounter for screening for malignant neoplasm of colon: Secondary | ICD-10-CM

## 2021-09-01 ENCOUNTER — Other Ambulatory Visit: Payer: Self-pay | Admitting: Obstetrics and Gynecology

## 2021-09-01 DIAGNOSIS — N72 Inflammatory disease of cervix uteri: Secondary | ICD-10-CM | POA: Diagnosis not present

## 2021-09-01 DIAGNOSIS — R87810 Cervical high risk human papillomavirus (HPV) DNA test positive: Secondary | ICD-10-CM | POA: Diagnosis not present

## 2021-09-07 DIAGNOSIS — E890 Postprocedural hypothyroidism: Secondary | ICD-10-CM | POA: Diagnosis not present

## 2021-09-23 DIAGNOSIS — N1832 Chronic kidney disease, stage 3b: Secondary | ICD-10-CM | POA: Diagnosis not present

## 2021-09-23 DIAGNOSIS — R634 Abnormal weight loss: Secondary | ICD-10-CM | POA: Diagnosis not present

## 2021-09-23 DIAGNOSIS — Z6827 Body mass index (BMI) 27.0-27.9, adult: Secondary | ICD-10-CM | POA: Diagnosis not present

## 2021-09-23 DIAGNOSIS — M797 Fibromyalgia: Secondary | ICD-10-CM | POA: Diagnosis not present

## 2021-09-23 DIAGNOSIS — E663 Overweight: Secondary | ICD-10-CM | POA: Diagnosis not present

## 2021-09-23 DIAGNOSIS — M5030 Other cervical disc degeneration, unspecified cervical region: Secondary | ICD-10-CM | POA: Diagnosis not present

## 2021-09-23 DIAGNOSIS — D631 Anemia in chronic kidney disease: Secondary | ICD-10-CM | POA: Diagnosis not present

## 2021-09-23 DIAGNOSIS — M3500 Sicca syndrome, unspecified: Secondary | ICD-10-CM | POA: Diagnosis not present

## 2021-09-24 DIAGNOSIS — D649 Anemia, unspecified: Secondary | ICD-10-CM | POA: Diagnosis not present

## 2021-09-24 DIAGNOSIS — K5909 Other constipation: Secondary | ICD-10-CM | POA: Diagnosis not present

## 2021-09-24 DIAGNOSIS — K649 Unspecified hemorrhoids: Secondary | ICD-10-CM | POA: Diagnosis not present

## 2021-10-07 DIAGNOSIS — H524 Presbyopia: Secondary | ICD-10-CM | POA: Diagnosis not present

## 2021-10-12 DIAGNOSIS — M4325 Fusion of spine, thoracolumbar region: Secondary | ICD-10-CM | POA: Diagnosis not present

## 2021-10-12 DIAGNOSIS — M7061 Trochanteric bursitis, right hip: Secondary | ICD-10-CM | POA: Diagnosis not present

## 2021-10-12 DIAGNOSIS — M7552 Bursitis of left shoulder: Secondary | ICD-10-CM | POA: Diagnosis not present

## 2021-10-12 DIAGNOSIS — M5106 Intervertebral disc disorders with myelopathy, lumbar region: Secondary | ICD-10-CM | POA: Diagnosis not present

## 2021-10-26 ENCOUNTER — Encounter: Payer: Self-pay | Admitting: Internal Medicine

## 2021-10-26 DIAGNOSIS — L659 Nonscarring hair loss, unspecified: Secondary | ICD-10-CM

## 2021-11-15 ENCOUNTER — Telehealth: Payer: Self-pay | Admitting: Dermatology

## 2021-11-15 DIAGNOSIS — Z1211 Encounter for screening for malignant neoplasm of colon: Secondary | ICD-10-CM | POA: Diagnosis not present

## 2021-11-15 LAB — HM COLONOSCOPY

## 2021-11-15 NOTE — Telephone Encounter (Signed)
Patient is calling for a referral appointment from Billey Gosling, M.D.  Patient does not want to wait until December 2023 to schedule at this time.  Patient states that she will call back to schedule at a later date. ?

## 2021-11-16 ENCOUNTER — Encounter: Payer: Self-pay | Admitting: Internal Medicine

## 2021-11-16 NOTE — Progress Notes (Signed)
Outside notes received. Information abstracted. Notes sent to scan.  

## 2021-11-29 DIAGNOSIS — Z0100 Encounter for examination of eyes and vision without abnormal findings: Secondary | ICD-10-CM | POA: Diagnosis not present

## 2021-11-30 NOTE — Progress Notes (Signed)
? ? ?Subjective:  ? ? Patient ID: Terri Roberts, female    DOB: 04-Dec-1960, 61 y.o.   MRN: [id number] ? ?This visit occurred during the SARS-CoV-2 public health emergency.  Safety protocols were in place, including screening questions prior to the visit, additional usage of staff PPE, and extensive cleaning of exam room while observing appropriate contact time as indicated for disinfecting solutions. ? ? ? ?HPI ?[name] is here for  ?Chief Complaint  ?Patient presents with  ? Nasal Congestion  ?  Post nasal drainage, cough (productive) congestion keeps her up at night (unable to breath) She also notes on the right side under her clavicle she feels palpations   ? ? ?She is here for an acute visit for cold symptoms.  Her symptoms started several days or more ago. ? ?She is experiencing congestion and PND for a while.  She started coughing up mucus a few days ago.  She states sinus pressure, headaches and some dizziness.  She has tried taking benadryl ? ? ?She has been experiencing nausea resulting in decreased appetite - eating less and weight loss.  This started about one year ago.  She thinks she has lost about 40 lbs.  Just had colonoscopy.  ? Did talk to GI about the nausea.  She did not want to do an EGD just after this.   ? ?Intermittent pulsating ? Muscle twitching in right upper chest.  At first it felt like her skin was pulling, but her skin appears normal and she has never seen any pulsating or muscle twitches. ? ? ?Medications and allergies reviewed with patient and updated if appropriate. ? ?Current Outpatient Medications on File Prior to Visit  ?Medication Sig Dispense Refill  ? baclofen (LIORESAL) 10 MG tablet Take 10 mg by mouth as needed for muscle spasms.    ? calcitRIOL (ROCALTROL) 0.5 MCG capsule TAKE TWICE DAILY 60 capsule 0  ? cycloSPORINE (RESTASIS) 0.05 % ophthalmic emulsion Place 1 drop into both eyes 2 (two) times daily.    ? diclofenac sodium (VOLTAREN) 1 % GEL Apply 4 g topically 4 (four)  times daily. 100 g 5  ? EMGALITY 120 MG/ML SOAJ Inject into the skin.    ? gabapentin (NEURONTIN) 300 MG capsule TAKE 2 CAPSULES (600 MG TOTAL) BY MOUTH 2 (TWO) TIMES DAILY. 90 capsule 0  ? HYDROcodone-acetaminophen (NORCO) 10-325 MG tablet Take 1 tablet by mouth 2 (two) times daily as needed.    ? hydroxychloroquine (PLAQUENIL) 200 MG tablet Take 400 mg by mouth daily.     ? hydrOXYzine (VISTARIL) 50 MG capsule TAKE 1 CAPSULE (50 MG TOTAL) BY MOUTH 3 (THREE) TIMES DAILY AS NEEDED. 90 capsule 2  ? linaclotide (LINZESS) 72 MCG capsule Take 1 capsule (72 mcg total) by mouth daily before breakfast. 30 capsule 5  ? omeprazole (PRILOSEC) 40 MG capsule TAKE 1 CAPSULE (40 MG TOTAL) BY MOUTH DAILY. 90 capsule 1  ? ondansetron (ZOFRAN) 4 MG tablet TAKE 1 TABLET BY MOUTH EVERY 8 HOURS AS NEEDED FOR NAUSEA AND VOMITING 30 tablet 5  ? pilocarpine (SALAGEN) 5 MG tablet TAKE 1 TABLET PO TWICE A DAY  3  ? pregabalin (LYRICA) 75 MG capsule 75 mg 2 (two) times daily.     ? SYNTHROID 100 MCG tablet Take 100 mcg by mouth every morning.    ? tiZANidine (ZANAFLEX) 4 MG tablet Take 1 tablet (4 mg total) by mouth every 6 (six) hours as needed for muscle spasms. 30 tablet 6  ?  topiramate (TOPAMAX) 200 MG tablet Take 200 mg by mouth at bedtime.     ? traMADol (ULTRAM) 50 MG tablet tramadol 50 mg tablet ?  100 mg by oral route.    ? zolmitriptan (ZOMIG) 5 MG nasal solution PLEASE SEE ATTACHED FOR DETAILED DIRECTIONS    ? fexofenadine (ALLEGRA ALLERGY) 180 MG tablet Take 1 tablet (180 mg total) by mouth daily for 15 days. 15 tablet 0  ? ?No current facility-administered medications on file prior to visit.  ? ? ?Review of Systems  ?Constitutional:  Positive for appetite change (due to nausea). Negative for fever.  ?HENT:  Positive for congestion, postnasal drip and sinus pressure. Negative for ear pain, sinus pain and sore throat.   ?Respiratory:  Positive for cough (productive). Negative for shortness of breath and wheezing.    ?Gastrointestinal:  Positive for constipation, nausea and vomiting (1-2 times a week). Negative for abdominal pain and diarrhea.  ?     Occ GERD  ?Neurological:  Positive for dizziness and headaches (intermittent).  ? ?   ?Objective:  ? ?Vitals:  ? 12/01/21 1107  ?BP: 122/82  ?Pulse: 78  ?Temp: 98.4 ?F (36.9 ?C)  ?SpO2: 98%  ? ?BP Readings from Last 3 Encounters:  ?12/01/21 122/82  ?04/23/21 112/72  ?04/19/21 120/81  ? ?Wt Readings from Last 3 Encounters:  ?12/01/21 154 lb (69.9 kg)  ?04/23/21 165 lb (74.8 kg)  ?04/19/21 166 lb (75.3 kg)  ? ?Body mass index is 26.85 kg/m?. ? ?  ?Physical Exam ?Constitutional:   ?   General: She is not in acute distress. ?   Appearance: Normal appearance. She is not ill-appearing.  ?HENT:  ?   Head: Normocephalic and atraumatic.  ?   Right Ear: Ear canal and external ear normal. There is no impacted cerumen.  ?   Left Ear: Ear canal and external ear normal. There is no impacted cerumen.  ?   Ears:  ?   Comments: Excessive cerumen bilateral ear canals-TMs not visualized ?   Mouth/Throat:  ?   Mouth: Mucous membranes are moist.  ?   Pharynx: No oropharyngeal exudate or posterior oropharyngeal erythema.  ?Eyes:  ?   Conjunctiva/sclera: Conjunctivae normal.  ?Cardiovascular:  ?   Rate and Rhythm: Normal rate and regular rhythm.  ?   Heart sounds: Normal heart sounds. No murmur heard. ?Pulmonary:  ?   Effort: Pulmonary effort is normal. No respiratory distress.  ?   Breath sounds: Normal breath sounds. No wheezing or rales.  ?Musculoskeletal:  ?   Cervical back: Neck supple. No tenderness.  ?   Right lower leg: No edema.  ?   Left lower leg: No edema.  ?Lymphadenopathy:  ?   Cervical: No cervical adenopathy.  ?Skin: ?   General: Skin is warm and dry.  ?   Findings: No rash.  ?   Comments: Normal-appearing right upper chest-not sure of the reason for her pulsating syndrome  ?Neurological:  ?   Mental Status: She is alert. Mental status is at baseline.  ?Psychiatric:     ?   Mood and  Affect: Mood normal.     ?   Behavior: Behavior normal.  ? ?   ? ? ? ? ? ?Assessment & Plan:  ? ? ?See Problem List for Assessment and Plan of chronic medical problems.  ? ? ? ? ?

## 2021-12-01 ENCOUNTER — Ambulatory Visit (INDEPENDENT_AMBULATORY_CARE_PROVIDER_SITE_OTHER): Payer: Medicare HMO | Admitting: Internal Medicine

## 2021-12-01 ENCOUNTER — Other Ambulatory Visit: Payer: Self-pay | Admitting: Internal Medicine

## 2021-12-01 ENCOUNTER — Encounter: Payer: Self-pay | Admitting: Internal Medicine

## 2021-12-01 DIAGNOSIS — R112 Nausea with vomiting, unspecified: Secondary | ICD-10-CM | POA: Diagnosis not present

## 2021-12-01 DIAGNOSIS — J019 Acute sinusitis, unspecified: Secondary | ICD-10-CM | POA: Diagnosis not present

## 2021-12-01 DIAGNOSIS — K5909 Other constipation: Secondary | ICD-10-CM

## 2021-12-01 MED ORDER — AMOXICILLIN-POT CLAVULANATE 875-125 MG PO TABS: 1.0000 | ORAL_TABLET | Freq: Two times a day (BID) | ORAL | 0 refills | Status: DC

## 2021-12-01 MED ORDER — TRULANCE 3 MG PO TABS: 3.0000 mg | ORAL_TABLET | Freq: Every day | ORAL | 5 refills | Status: DC

## 2021-12-01 NOTE — Assessment & Plan Note (Addendum)
Chronic ?Just had colonoscopy and it was normal ?otc meds not effective ?linzess did not work well - too harsh at lowest dose ?Tried sample of trulance and it worked well. ?Start trulance 3mg  daily ?

## 2021-12-01 NOTE — Patient Instructions (Addendum)
? ? ?  Follow up with GI.  Follow up with Dr Carmina Miller.   ? ? ? ?Medications changes include :   trulance 3 mg daily and Augmentin for your infection ? ? ?Your prescription(s) have been sent to your pharmacy.  ? ? ?Return if symptoms worsen or fail to improve. ? ?

## 2021-12-01 NOTE — Assessment & Plan Note (Signed)
Subacute ?She states it started about a year ago ?She did see GI recently and had a colonoscopy and did discuss this with them a little and they wanted to do an EGD, but she declined ?Still experiencing nausea and vomiting-vomits 1-2 times a week ?She has decreased appetite and has lost weight because of the nausea/vomiting ?Advised that she needs to follow-up with GI-needs EGD and possibly further evaluation to determine cause ?

## 2021-12-01 NOTE — Assessment & Plan Note (Signed)
Acute ?Likely bacterial given her autoimmune diseases ?Start Augmentin 875-125 mg BID x 10 day ?otc cold medications ?Rest, fluid ?Call if no improvement ? ?

## 2021-12-01 NOTE — Telephone Encounter (Signed)
Rec'd msg stating INSURANCE WANTS LACTULOSE OR A PA, THANKS. ?

## 2021-12-02 NOTE — Telephone Encounter (Signed)
Key: [id number] ?

## 2021-12-02 NOTE — Telephone Encounter (Signed)
Terri Roberts ([phone]) ? ?Your information has been submitted to [facility]. [employer] will review the request and will issue a decision, typically within 3-7 days from your submission. You can check the updated outcome later by reopening this request. ? ?If [employer] has not responded in 3-7 days or if you have any questions about your ePA request, please contact [employer] at [phone]. If you think there may be a problem with your PA request, use our live chat feature at the bottom right. ? ?For [place] requests, please call [phone]. ?

## 2021-12-03 DIAGNOSIS — D509 Iron deficiency anemia, unspecified: Secondary | ICD-10-CM | POA: Diagnosis not present

## 2021-12-03 DIAGNOSIS — K2950 Unspecified chronic gastritis without bleeding: Secondary | ICD-10-CM | POA: Diagnosis not present

## 2021-12-03 DIAGNOSIS — K3189 Other diseases of stomach and duodenum: Secondary | ICD-10-CM | POA: Diagnosis not present

## 2021-12-08 ENCOUNTER — Encounter: Payer: Self-pay | Admitting: Internal Medicine

## 2021-12-14 DIAGNOSIS — M545 Low back pain, unspecified: Secondary | ICD-10-CM | POA: Diagnosis not present

## 2021-12-14 DIAGNOSIS — M4325 Fusion of spine, thoracolumbar region: Secondary | ICD-10-CM | POA: Diagnosis not present

## 2021-12-14 DIAGNOSIS — M47896 Other spondylosis, lumbar region: Secondary | ICD-10-CM | POA: Diagnosis not present

## 2021-12-14 NOTE — Telephone Encounter (Signed)
The drug you asked for is not listed in your preferred drug list (formulary). The preferred drug(s), you may not have tried are: lactulose oral solution. Your provider needs to give [place] medical reasons why the preferred drug(s) would not work for you and/or would have bad side effects. Sometimes a preferred drug needs more review for approval. Additionally, some preferred drugs listed may be the same drugs with different strengths or forms. [employer] may only require one strength or form of that drug to be tried. This decision was from [employer] Non-Formulary Exceptions Coverage Policy. ?

## 2021-12-15 ENCOUNTER — Other Ambulatory Visit: Payer: Self-pay | Admitting: Internal Medicine

## 2022-01-04 DIAGNOSIS — G43019 Migraine without aura, intractable, without status migrainosus: Secondary | ICD-10-CM | POA: Diagnosis not present

## 2022-01-04 DIAGNOSIS — G43111 Migraine with aura, intractable, with status migrainosus: Secondary | ICD-10-CM | POA: Diagnosis not present

## 2022-01-04 DIAGNOSIS — G43719 Chronic migraine without aura, intractable, without status migrainosus: Secondary | ICD-10-CM | POA: Diagnosis not present

## 2022-01-06 ENCOUNTER — Ambulatory Visit (INDEPENDENT_AMBULATORY_CARE_PROVIDER_SITE_OTHER): Payer: Medicare HMO

## 2022-01-06 DIAGNOSIS — Z0000 Encounter for general adult medical examination without abnormal findings: Secondary | ICD-10-CM | POA: Diagnosis not present

## 2022-01-06 NOTE — Progress Notes (Signed)
I connected with Terri Roberts today by telephone and verified that I am speaking with the correct person using two identifiers. Location patient: home Location provider: work Persons participating in the virtual visit: patient, provider.   I discussed the limitations, risks, security and privacy concerns of performing an evaluation and management service by telephone and the availability of in person appointments. I also discussed with the patient that there may be a patient responsible charge related to this service. The patient expressed understanding and verbally consented to this telephonic visit.    Interactive audio and video telecommunications were attempted between this provider and patient, however failed, due to patient having technical difficulties OR patient did not have access to video capability.  We continued and completed visit with audio only.  Some vital signs may be absent or patient reported.   Time Spent with patient on telephone encounter: 30 minutes  Subjective:   CASONDRA Roberts is a 61 y.o. female who presents for an Initial Medicare Annual Wellness Visit.  Review of Systems     Cardiac Risk Factors include: advanced age (>42men, >1 women);family history of premature cardiovascular disease;sedentary lifestyle     Objective:    Today's Vitals   01/06/22 1620  PainSc: 7    There is no height or weight on file to calculate BMI.     01/06/2022    4:25 PM 10/25/2016   12:00 AM 10/24/2016    9:23 PM 04/24/2016    8:56 PM  Advanced Directives  Does Patient Have a Medical Advance Directive? No No No No  Would patient like information on creating a medical advance directive? No - Patient declined No - Patient declined  No - patient declined information    Current Medications (verified) Outpatient Encounter Medications as of 01/06/2022  Medication Sig   amoxicillin-clavulanate (AUGMENTIN) 875-125 MG tablet Take 1 tablet by mouth 2 (two) times daily.   baclofen  (LIORESAL) 10 MG tablet Take 10 mg by mouth as needed for muscle spasms.   calcitRIOL (ROCALTROL) 0.5 MCG capsule TAKE TWICE DAILY   cycloSPORINE (RESTASIS) 0.05 % ophthalmic emulsion Place 1 drop into both eyes 2 (two) times daily.   diclofenac sodium (VOLTAREN) 1 % GEL Apply 4 g topically 4 (four) times daily.   EMGALITY 120 MG/ML SOAJ Inject into the skin.   fexofenadine (ALLEGRA ALLERGY) 180 MG tablet Take 1 tablet (180 mg total) by mouth daily for 15 days.   gabapentin (NEURONTIN) 300 MG capsule TAKE 2 CAPSULES (600 MG TOTAL) BY MOUTH 2 (TWO) TIMES DAILY.   HYDROcodone-acetaminophen (NORCO) 10-325 MG tablet Take 1 tablet by mouth 2 (two) times daily as needed.   hydroxychloroquine (PLAQUENIL) 200 MG tablet Take 400 mg by mouth daily.    hydrOXYzine (VISTARIL) 50 MG capsule TAKE 1 CAPSULE (50 MG TOTAL) BY MOUTH 3 (THREE) TIMES DAILY AS NEEDED.   lactulose (CHRONULAC) 10 GM/15ML solution Take 22.5-45 mLs (15-30 g total) by mouth daily as needed for moderate constipation or severe constipation.   linaclotide (LINZESS) 72 MCG capsule Take 1 capsule (72 mcg total) by mouth daily before breakfast.   omeprazole (PRILOSEC) 40 MG capsule TAKE 1 CAPSULE (40 MG TOTAL) BY MOUTH DAILY.   ondansetron (ZOFRAN) 4 MG tablet TAKE 1 TABLET BY MOUTH EVERY 8 HOURS AS NEEDED FOR NAUSEA AND VOMITING   pilocarpine (SALAGEN) 5 MG tablet TAKE 1 TABLET PO TWICE A DAY   pregabalin (LYRICA) 75 MG capsule 75 mg 2 (two) times daily.    SYNTHROID  100 MCG tablet Take 100 mcg by mouth every morning.   tiZANidine (ZANAFLEX) 4 MG tablet Take 1 tablet (4 mg total) by mouth every 6 (six) hours as needed for muscle spasms.   topiramate (TOPAMAX) 200 MG tablet Take 200 mg by mouth at bedtime.    traMADol (ULTRAM) 50 MG tablet tramadol 50 mg tablet   100 mg by oral route.   zolmitriptan (ZOMIG) 5 MG nasal solution PLEASE SEE ATTACHED FOR DETAILED DIRECTIONS   No facility-administered encounter medications on file as of 01/06/2022.     Allergies (verified) Nsaids   History: Past Medical History:  Diagnosis Date   Fibromyalgia    Migraine    Dr Domingo Cocking   Peripheral neuropathy    Sjogren's syndrome ([place])    Dr Ouida Sills   Thyroid nodule    Past Surgical History:  Procedure Laterality Date   BACK SURGERY     Intrauterine Ablation  08/01/2009   Dr Raphael Gibney ( now seeing Dr Christophe Louis)   lip biopsy  08/01/2012   Dr Thea Gist   MANDIBLE SURGERY     TMJ    THYROIDECTOMY  06/01/2009   benign nodule   TUBAL LIGATION     Family History  Problem Relation Age of Onset   Stroke Mother 51   Diabetes Mother    Kidney failure Mother    AAA (abdominal aortic aneurysm) Father    Stroke Sister 68   Hypertension Sister    Diabetes Sister    Hypertension Sister    Heart attack Maternal Grandmother        ? age   Heart attack Maternal Grandfather        ? age   Diabetes Brother    Kidney failure Brother    Cancer Neg Hx    Breast cancer Neg Hx    Social History   Socioeconomic History   Marital status: Widowed    Spouse name: Not on file   Number of children: 2   Years of education: 14   Highest education level: Not on file  Occupational History   Occupation: Product Compliance Specialist  Tobacco Use   Smoking status: Never   Smokeless tobacco: Never  Vaping Use   Vaping Use: Never used  Substance and Sexual Activity   Alcohol use: Not Currently    Comment: rarely   Drug use: No   Sexual activity: Not on file  Other Topics Concern   Not on file  Social History Narrative   Lives at home with her daughter.   Right-handed.   One cup caffeine per day.   Social Determinants of Health   Financial Resource Strain: Low Risk  (01/06/2022)   Overall Financial Resource Strain (CARDIA)    Difficulty of Paying Living Expenses: Not hard at all  Food Insecurity: No Food Insecurity (01/06/2022)   Hunger Vital Sign    Worried About Running Out of Food in the Last Year: Never true    Ran Out of Food in the  Last Year: Never true  Transportation Needs: No Transportation Needs (01/06/2022)   PRAPARE - Hydrologist (Medical): No    Lack of Transportation (Non-Medical): No  Physical Activity: Inactive (01/06/2022)   Exercise Vital Sign    Days of Exercise per Week: 0 days    Minutes of Exercise per Session: 0 min  Stress: No Stress Concern Present (01/06/2022)   [place]  of Stress : Not at all  Social Connections: Moderately Integrated (01/06/2022)   Social Connection and Isolation Panel [NHANES]    Frequency of Communication with Friends and Family: More than three times a week    Frequency of Social Gatherings with Friends and Family: More than three times a week    Attends Religious Services: More than 4 times per year    Active Member of [employer] or Organizations: Yes    Attends Archivist Meetings: More than 4 times per year    Marital Status: Widowed    Tobacco Counseling Counseling given: Not Answered   Clinical Intake:  Pre-visit preparation completed: Yes  Pain : 0-10 Pain Score: 7  Pain Type: Chronic pain Pain Location: Back Pain Orientation: Upper, Mid, Lower Pain Radiating Towards: none Pain Descriptors / Indicators: Shooting, Stabbing, Tingling, Dull, Discomfort, Burning, Constant Pain Onset: More than a month ago Pain Frequency: Constant Pain Relieving Factors: Hydrocodone, Gabapentin, Heating Pad Effect of Pain on Daily Activities: Pain can diminish job performance, lower motivation to exercise, and prevent you from completing daily tasks. Pain produces disability and affects the quality of life.  Pain Relieving Factors: Hydrocodone, Gabapentin, Heating Pad  BMI - recorded: 26.85 Nutritional Status: BMI 25 -29 Overweight Nutritional Risks: Nausea/ vomitting/ diarrhea, Unintentional weight loss Diabetes: No  How often do you need to have someone help you  when you read instructions, pamphlets, or other written materials from your doctor or pharmacy?: 1 - Never What is the last grade level you completed in school?: Some college  Diabetic? no  Interpreter Needed?: No  Information entered by :: Lisette Abu, LPN.   Activities of Daily Living    01/06/2022    4:42 PM  In your present state of health, do you have any difficulty performing the following activities:  Hearing? 0  Vision? 0  Difficulty concentrating or making decisions? 1  Walking or climbing stairs? 0  Dressing or bathing? 0  Doing errands, shopping? 0  Preparing Food and eating ? Y  Using the Toilet? N  In the past six months, have you accidently leaked urine? N  Do you have problems with loss of bowel control? N  Managing your Medications? N  Managing your Finances? N  Housekeeping or managing your Housekeeping? Y    Patient Care Team: [name], MD as PCP - General (Internal Medicine) Valinda Party, MD (Rheumatology) Renelda Loma, OD as Consulting Physician (Optometry)  Indicate any recent Medical Services you may have received from other than [facility] providers in the past year (date may be approximate).     Assessment:   This is a routine wellness examination for Netta.  Hearing/Vision screen Hearing Screening - Comments:: Patient denied any hearing difficulty.   No hearing aids.  Vision Screening - Comments:: Patient does wear corrective lenses/contacts.  Eye exam done by: Hoyle Sauer, OD with [facility]   Dietary issues and exercise activities discussed: Current Exercise Habits: The patient does not participate in regular exercise at present, Exercise limited by: neurologic condition(s);orthopedic condition(s)   Goals Addressed             This Visit's Progress    My goal is to start walking for exercise.        Depression Screen    01/06/2022    4:29 PM 06/07/2017    1:57 PM  PHQ 2/9 Scores  PHQ - 2 Score 2 0  PHQ- 9 Score 7  Fall Risk    01/06/2022    4:26 PM 06/07/2017    1:57 PM  [place] in the past year? 0 No  Number falls in past yr: 0   Injury with Fall? 0   Risk for fall due to : No Fall Risks   Follow up Falls evaluation completed     [place]:  Any stairs in or around the home? Yes  If so, are there any without handrails? No  Home free of loose throw rugs in walkways, pet beds, electrical cords, etc? Yes  Adequate lighting in your home to reduce risk of falls? Yes   ASSISTIVE DEVICES UTILIZED TO PREVENT FALLS:  Life alert? No  Use of a cane, walker or w/c? No  Grab bars in the bathroom? Yes  Shower chair or bench in shower? Yes  Elevated toilet seat or a handicapped toilet? No   TIMED UP AND GO:  Was the test performed? No .  Length of time to ambulate 10 feet: n/a sec.   Appearance of gait: Patient not evaluated for gait during this visit.  Cognitive Function:        01/06/2022    4:43 PM  6CIT Screen  What Year? 0 points  What month? 0 points  What time? 0 points  Count back from 20 0 points  Months in reverse 0 points  Repeat phrase 0 points  Total Score 0 points    Immunizations Immunization History  Administered Date(s) Administered   Influenza, Quadrivalent, Recombinant, Inj, Pf 05/06/2014, 08/04/2015   Influenza,inj,Quad PF,6+ Mos 05/06/2014, 08/04/2015, 05/30/2017   PFIZER(Purple Top)SARS-COV-2 Vaccination 10/11/2019, 11/01/2019, 06/19/2020   PPD Test 02/12/2013   Tdap 11/01/2012    TDAP status: Up to date  Flu Vaccine status: Declined, Education has been provided regarding the importance of this vaccine but patient still declined. Advised may receive this vaccine at local pharmacy or Health Dept. Aware to provide a copy of the vaccination record if obtained from local pharmacy or Health Dept. Verbalized acceptance and understanding.  Pneumococcal vaccine status: Declined,  Education has been provided regarding  the importance of this vaccine but patient still declined. Advised may receive this vaccine at local pharmacy or Health Dept. Aware to provide a copy of the vaccination record if obtained from local pharmacy or Health Dept. Verbalized acceptance and understanding.   Covid-19 vaccine status: Completed vaccines  Qualifies for Shingles Vaccine? Yes   Zostavax completed No   Shingrix Completed?: No.    Education has been provided regarding the importance of this vaccine. Patient has been advised to call insurance company to determine out of pocket expense if they have not yet received this vaccine. Advised may also receive vaccine at local pharmacy or Health Dept. Verbalized acceptance and understanding.  Screening Tests Health Maintenance  Topic Date Due   Zoster Vaccines- Shingrix (1 of 2) Never done   COVID-19 Vaccine (4 - Booster for Pfizer series) 08/14/2020   INFLUENZA VACCINE  03/01/2022   TETANUS/TDAP  11/02/2022   MAMMOGRAM  04/30/2023   PAP SMEAR-Modifier  07/23/2024   COLONOSCOPY (Pts 45-39yrs Insurance coverage will need to be confirmed)  11/16/2031   Hepatitis C Screening  Completed   HIV Screening  Completed   HPV VACCINES  Aged Out    Health Maintenance  Health Maintenance Due  Topic Date Due   Zoster Vaccines- Shingrix (1 of 2) Never done   COVID-19 Vaccine (4 - Booster for  Pfizer series) 08/14/2020    Colorectal cancer screening: Type of screening: Colonoscopy. Completed 11/15/2021. Repeat every 10 years  Mammogram status: Completed 06/04/2021. Repeat every year  None Density Screening:  never done  Lung Cancer Screening: (Low Dose CT Chest recommended if Age 90-80 years, 30 pack-year currently smoking OR have quit w/in 15years.) does not qualify.   Lung Cancer Screening Referral: no  Additional Screening:  Hepatitis C Screening: does qualify; Completed 10/25/2016  Vision Screening: Recommended annual ophthalmology exams for early detection of glaucoma and  other disorders of the eye. Is the patient up to date with their annual eye exam?  Yes  Who is the provider or what is the name of the office in which the patient attends annual eye exams? Amy Harper, OD. If pt is not established with a provider, would they like to be referred to a provider to establish care? No .   Dental Screening: Recommended annual dental exams for proper oral hygiene  Community Resource Referral / Chronic Care Management: CRR required this visit?  No   CCM required this visit?  No      Plan:     I have personally reviewed and noted the following in the patient's chart:   Medical and social history Use of alcohol, tobacco or illicit drugs  Current medications and supplements including opioid prescriptions. Patient is currently taking opioid prescriptions. Information provided to patient regarding non-opioid alternatives. Patient advised to discuss non-opioid treatment plan with their provider. Functional ability and status Nutritional status Physical activity Advanced directives List of other physicians Hospitalizations, surgeries, and ER visits in previous 12 months Vitals Screenings to include cognitive, depression, and falls Referrals and appointments  In addition, I have reviewed and discussed with patient certain preventive protocols, quality metrics, and best practice recommendations. A written personalized care plan for preventive services as well as general preventive health recommendations were provided to patient.     Sheral Flow, LPN   11/05/960   Nurse Notes:  There were no vitals filed for this visit. There is no height or weight on file to calculate BMI.

## 2022-01-06 NOTE — Patient Instructions (Signed)
[name] , Thank you for taking time to come for your Medicare Wellness Visit. I appreciate your ongoing commitment to your health goals. Please review the following plan we discussed and let me know if I can assist you in the future.   Screening recommendations/referrals: Colonoscopy: 11/15/2021; due every 10 years  Mammogram: 06/04/2021; due every year Bone Density: never done Recommended yearly ophthalmology/optometry visit for glaucoma screening and checkup Recommended yearly dental visit for hygiene and checkup  Vaccinations: Influenza vaccine: declined Pneumococcal vaccine: declined Tdap vaccine: 11/01/2012; due every 10 years Shingles vaccine: declined  Covid-19: 10/11/2019, 11/01/2019, 06/19/2020  Advanced directives: NO  Conditions/risks identified: YES  Next appointment: Please schedule your next Medicare Wellness Visit with your Nurse Health Advisor in 1 year by calling [phone].  Preventive Care 40-64 Years, Female Preventive care refers to lifestyle choices and visits with your health care provider that can promote health and wellness. What does preventive care include? A yearly physical exam. This is also called an annual well check. Dental exams once or twice a year. Routine eye exams. Ask your health care provider how often you should have your eyes checked. Personal lifestyle choices, including: Daily care of your teeth and gums. Regular physical activity. Eating a healthy diet. Avoiding tobacco and drug use. Limiting alcohol use. Practicing safe sex. Taking low-dose aspirin daily starting at age 73. Taking vitamin and mineral supplements as recommended by your health care provider. What happens during an annual well check? The services and screenings done by your health care provider during your annual well check will depend on your age, overall health, lifestyle risk factors, and family history of disease. Counseling  Your health care provider may ask you  questions about your: Alcohol use. Tobacco use. Drug use. Emotional well-being. Home and relationship well-being. Sexual activity. Eating habits. Work and work Statistician. Method of birth control. Menstrual cycle. Pregnancy history. Screening  You may have the following tests or measurements: Height, weight, and BMI. Blood pressure. Lipid and cholesterol levels. These may be checked every 5 years, or more frequently if you are over 70 years old. Skin check. Lung cancer screening. You may have this screening every year starting at age 70 if you have a 30-pack-year history of smoking and currently smoke or have quit within the past 15 years. Fecal occult blood test (FOBT) of the stool. You may have this test every year starting at age 68. Flexible sigmoidoscopy or colonoscopy. You may have a sigmoidoscopy every 5 years or a colonoscopy every 10 years starting at age 67. Hepatitis C blood test. Hepatitis B blood test. Sexually transmitted disease (STD) testing. Diabetes screening. This is done by checking your blood sugar (glucose) after you have not eaten for a while (fasting). You may have this done every 1-3 years. Mammogram. This may be done every 1-2 years. Talk to your health care provider about when you should start having regular mammograms. This may depend on whether you have a family history of breast cancer. BRCA-related cancer screening. This may be done if you have a family history of breast, ovarian, tubal, or peritoneal cancers. Pelvic exam and Pap test. This may be done every 3 years starting at age 45. Starting at age 76, this may be done every 5 years if you have a Pap test in combination with an HPV test. Bone density scan. This is done to screen for osteoporosis. You may have this scan if you are at high risk for osteoporosis. Discuss your test results, treatment  options, and if necessary, the need for more tests with your health care provider. Vaccines  Your health  care provider may recommend certain vaccines, such as: Influenza vaccine. This is recommended every year. Tetanus, diphtheria, and acellular pertussis (Tdap, Td) vaccine. You may need a Td booster every 10 years. Zoster vaccine. You may need this after age 68. Pneumococcal 13-valent conjugate (PCV13) vaccine. You may need this if you have certain conditions and were not previously vaccinated. Pneumococcal polysaccharide (PPSV23) vaccine. You may need one or two doses if you smoke cigarettes or if you have certain conditions. Talk to your health care provider about which screenings and vaccines you need and how often you need them. This information is not intended to replace advice given to you by your health care provider. Make sure you discuss any questions you have with your health care provider. Document Released: 08/14/2015 Document Revised: 04/06/2016 Document Reviewed: 05/19/2015 Elsevier Interactive Patient Education  2017 [employer] Prevention in the Home Falls can cause injuries. They can happen to people of all ages. There are many things you can do to make your home safe and to help prevent falls. What can I do on the outside of my home? Regularly fix the edges of walkways and driveways and fix any cracks. Remove anything that might make you trip as you walk through a door, such as a raised step or threshold. Trim any bushes or trees on the path to your home. Use bright outdoor lighting. Clear any walking paths of anything that might make someone trip, such as rocks or tools. Regularly check to see if handrails are loose or broken. Make sure that both sides of any steps have handrails. Any raised decks and porches should have guardrails on the edges. Have any leaves, snow, or ice cleared regularly. Use sand or salt on walking paths during winter. Clean up any spills in your garage right away. This includes oil or grease spills. What can I do in the bathroom? Use  night lights. Install grab bars by the toilet and in the tub and shower. Do not use towel bars as grab bars. Use non-skid mats or decals in the tub or shower. If you need to sit down in the shower, use a plastic, non-slip stool. Keep the floor dry. Clean up any water that spills on the floor as soon as it happens. Remove soap buildup in the tub or shower regularly. Attach bath mats securely with double-sided non-slip rug tape. Do not have throw rugs and other things on the floor that can make you trip. What can I do in the bedroom? Use night lights. Make sure that you have a light by your bed that is easy to reach. Do not use any sheets or blankets that are too big for your bed. They should not hang down onto the floor. Have a firm chair that has side arms. You can use this for support while you get dressed. Do not have throw rugs and other things on the floor that can make you trip. What can I do in the kitchen? Clean up any spills right away. Avoid walking on wet floors. Keep items that you use a lot in easy-to-reach places. If you need to reach something above you, use a strong step stool that has a grab bar. Keep electrical cords out of the way. Do not use floor polish or wax that makes floors slippery. If you must use wax, use non-skid floor wax. Do  not have throw rugs and other things on the floor that can make you trip. What can I do with my stairs? Do not leave any items on the stairs. Make sure that there are handrails on both sides of the stairs and use them. Fix handrails that are broken or loose. Make sure that handrails are as long as the stairways. Check any carpeting to make sure that it is firmly attached to the stairs. Fix any carpet that is loose or worn. Avoid having throw rugs at the top or bottom of the stairs. If you do have throw rugs, attach them to the floor with carpet tape. Make sure that you have a light switch at the top of the stairs and the bottom of the  stairs. If you do not have them, ask someone to add them for you. What else can I do to help prevent falls? Wear shoes that: Do not have high heels. Have rubber bottoms. Are comfortable and fit you well. Are closed at the toe. Do not wear sandals. If you use a stepladder: Make sure that it is fully opened. Do not climb a closed stepladder. Make sure that both sides of the stepladder are locked into place. Ask someone to hold it for you, if possible. Clearly mark and make sure that you can see: Any grab bars or handrails. First and last steps. Where the edge of each step is. Use tools that help you move around (mobility aids) if they are needed. These include: Canes. Walkers. Scooters. Crutches. Turn on the lights when you go into a dark area. Replace any light bulbs as soon as they burn out. Set up your furniture so you have a clear path. Avoid moving your furniture around. If any of your floors are uneven, fix them. If there are any pets around you, be aware of where they are. Review your medicines with your doctor. Some medicines can make you feel dizzy. This can increase your chance of falling. Ask your doctor what other things that you can do to help prevent falls. This information is not intended to replace advice given to you by your health care provider. Make sure you discuss any questions you have with your health care provider. Document Released: 05/14/2009 Document Revised: 12/24/2015 Document Reviewed: 08/22/2014 Elsevier Interactive Patient Education  2017 [employer].

## 2022-01-17 DIAGNOSIS — R112 Nausea with vomiting, unspecified: Secondary | ICD-10-CM | POA: Diagnosis not present

## 2022-01-17 DIAGNOSIS — K5909 Other constipation: Secondary | ICD-10-CM | POA: Diagnosis not present

## 2022-01-17 DIAGNOSIS — D509 Iron deficiency anemia, unspecified: Secondary | ICD-10-CM | POA: Diagnosis not present

## 2022-01-19 ENCOUNTER — Encounter: Payer: Self-pay | Admitting: Internal Medicine

## 2022-01-19 DIAGNOSIS — R0982 Postnasal drip: Secondary | ICD-10-CM

## 2022-01-21 ENCOUNTER — Other Ambulatory Visit: Payer: Self-pay | Admitting: Internal Medicine

## 2022-01-25 MED ORDER — OMEPRAZOLE 40 MG PO CPDR: 40.0000 mg | DELAYED_RELEASE_CAPSULE | Freq: Every day | ORAL | 1 refills | Status: DC

## 2022-01-27 ENCOUNTER — Encounter: Payer: Self-pay | Admitting: Emergency Medicine

## 2022-01-27 ENCOUNTER — Emergency Department (INDEPENDENT_AMBULATORY_CARE_PROVIDER_SITE_OTHER)
Admission: EM | Admit: 2022-01-27 | Discharge: 2022-01-27 | Disposition: A | Discharge status: Home / Self Care | Payer: Medicare HMO | Source: Home / Self Care | Attending: Family Medicine | Admitting: Family Medicine

## 2022-01-27 ENCOUNTER — Emergency Department (INDEPENDENT_AMBULATORY_CARE_PROVIDER_SITE_OTHER): Payer: Medicare HMO

## 2022-01-27 DIAGNOSIS — R059 Cough, unspecified: Secondary | ICD-10-CM

## 2022-01-27 DIAGNOSIS — R0982 Postnasal drip: Secondary | ICD-10-CM

## 2022-01-27 DIAGNOSIS — R062 Wheezing: Secondary | ICD-10-CM

## 2022-01-27 DIAGNOSIS — R051 Acute cough: Secondary | ICD-10-CM | POA: Diagnosis not present

## 2022-01-27 MED ORDER — AEROCHAMBER PLUS FLO-VU MEDIUM MISC
1.0000 | Freq: Once | Status: AC
  Administered 2022-01-27: 1

## 2022-01-27 MED ORDER — ALBUTEROL SULFATE HFA 108 (90 BASE) MCG/ACT IN AERS
1.0000 | INHALATION_SPRAY | Freq: Once | RESPIRATORY_TRACT | Status: AC
  Administered 2022-01-27: 1 via RESPIRATORY_TRACT

## 2022-01-27 MED ORDER — PREDNISONE 20 MG PO TABS: 40.0000 mg | ORAL_TABLET | Freq: Every day | ORAL | 0 refills | Status: DC

## 2022-01-27 MED ORDER — HYDROCOD POLI-CHLORPHE POLI ER 10-8 MG/5ML PO SUER: 5.0000 mL | Freq: Two times a day (BID) | ORAL | 0 refills | Status: DC | PRN

## 2022-01-27 MED ORDER — FLUTICASONE PROPIONATE 50 MCG/ACT NA SUSP: 2.0000 | Freq: Every day | NASAL | 0 refills | Status: AC

## 2022-01-27 NOTE — ED Triage Notes (Signed)
Patient c/o productive cough, wheezing, nausea and vomiting.  Patient is SOB, unable to sleep at night. This has been going on for several weeks.  Patient has taken Amoxil, Mucinex w/o relief.

## 2022-01-27 NOTE — Discharge Instructions (Addendum)
Make sure you are drinking plenty of water Use a humidifier if you have 1 Use the Flonase daily to reduce your postnasal drip Take prednisone daily to help with the swelling and inflammation in your sinuses, and will help reduce your wheezing Use albuterol inhaler for shortness of breath I will give you a cough medicine for bedtime use Call your primary care doctor for follow-up

## 2022-01-27 NOTE — ED Provider Notes (Signed)
[name]    CSN: [id number] Arrival date & time: 01/27/22  1657      History   Chief Complaint Chief Complaint  Patient presents with   Cough    HPI Terri Roberts is a 61 y.o. female.   HPI  Patient does not feeling well.  She states that she continues to have nausea and vomiting that is been going on for months.  In addition she has a productive cough and wheezing that is been going on for weeks.  She has taken the course of Augmentin from her primary care doctor which did not improve her postnasal drip or coughing at all.  She states that she is also use Mucinex for the mucus and cough with no improvement.  She states that she requested an ENT consultation but has not Been called about an appointment.  It has been over a month. Patient states that she has sinus congestion but no real pain.  She has a lot of postnasal drip.  She has coughing.  She states that she has coughing spells that keep her awake at night.  She feels short of breath.  She can tell that she is wheezing.  She has never been a smoker, she has never had asthma or wheezing before.  She did do a COVID test that was negative. Patient states her daughter who is currently in the hospital with pneumonia.  She is caring for her 52-year-old granddaughter who is coughing as well. No fever or chills or body aches.  No sore throat or headache.  Past Medical History:  Diagnosis Date   Fibromyalgia    Migraine    Dr Domingo Cocking   Peripheral neuropathy    Sjogren's syndrome ([place])    Dr Ouida Sills   Thyroid nodule     Patient Active Problem List   Diagnosis Date Noted   Acute sinus infection 12/01/2021   Nausea and vomiting 12/01/2021   Fibromuscular dysplasia of both carotid arteries ([place]) 05/13/2020   Right carotid bruit 04/20/2020   Sensation disturbance of skin 04/20/2020   Chronic back pain 07/24/2019   GERD (gastroesophageal reflux disease) 07/24/2019   Migraine-Dr. Domingo Cocking 04/17/2019   Low back  pain 04/17/2019   Chronic pain of left knee 11/28/2018   Anxiety 08/01/2018   Spinal stenosis 08/01/2018   Tingling in extremities 12/02/2017   Tingling of left upper extremity 10/25/2017   Ineffective thermoregulation 10/25/2017   Vitamin D deficiency 05/09/2017   CKD (chronic kidney disease)-Dr. Carmina Miller 10/24/2016   Normocytic anemia 10/24/2016   Periodic limb movement 10/09/2016   T12 burst fracture ([place]) 08/05/2016   OSA on CPAP 04/01/2016   Insomnia 01/12/2016   Chronic pain syndrome 01/12/2016   Joint pain 01/12/2016   Numbness and tingling 01/12/2016   Chronic fatigue 01/12/2016   Depression 01/12/2016   Neck pain 06/10/2015   Bilateral calf pain 03/05/2015   Fibromyalgia-Dr. Amil Amen 03/04/2015   Postprocedural hypothyroidism - [facility] at [facility] 03/04/2015   Postprocedural hypoparathyroidism ([place]) 03/04/2015   Sjogren's syndrome ([facility])-Dr. Amil Amen 11/01/2012   Chronic constipation 05/14/2009   THYROID NODULE 03/10/2009   PALPITATIONS 03/03/2009    Past Surgical History:  Procedure Laterality Date   BACK SURGERY     Intrauterine Ablation  08/01/2009   Dr Raphael Gibney ( now seeing Dr Christophe Louis)   lip biopsy  08/01/2012   Dr Thea Gist   MANDIBLE SURGERY     TMJ    THYROIDECTOMY  06/01/2009   benign nodule  TUBAL LIGATION      OB History   No obstetric history on file.      Home Medications    Prior to Admission medications   Medication Sig Start Date End Date Taking? Authorizing Provider  calcitRIOL (ROCALTROL) 0.5 MCG capsule TAKE TWICE DAILY 06/20/18  Yes Elayne Snare, MD  chlorpheniramine-HYDROcodone [facility] PENNKINETIC ER) 10-8 MG/5ML Take 5 mLs by mouth every 12 (twelve) hours as needed for cough. 01/27/22  Yes Raylene Everts, MD  cycloSPORINE (RESTASIS) 0.05 % ophthalmic emulsion Place 1 drop into both eyes 2 (two) times daily.   Yes [provider]  diclofenac sodium (VOLTAREN) 1 % GEL Apply 4 g topically 4 (four) times daily. 11/28/18   Yes Burns, Claudina Lick, MD  EMGALITY 120 MG/ML SOAJ Inject into the skin. 01/19/21  Yes [provider]  fluticasone (FLONASE) 50 MCG/ACT nasal spray Place 2 sprays into both nostrils daily. 01/27/22  Yes Raylene Everts, MD  gabapentin (NEURONTIN) 300 MG capsule TAKE 2 CAPSULES (600 MG TOTAL) BY MOUTH 2 (TWO) TIMES DAILY. 08/21/18  Yes Burns, Claudina Lick, MD  HYDROcodone-acetaminophen (NORCO) 10-325 MG tablet Take 1 tablet by mouth 2 (two) times daily as needed. 03/05/21  Yes [provider]  hydroxychloroquine (PLAQUENIL) 200 MG tablet Take 400 mg by mouth daily.    Yes [provider]  omeprazole (PRILOSEC) 40 MG capsule Take 1 capsule (40 mg total) by mouth daily. 01/25/22  Yes Burns, Claudina Lick, MD  ondansetron (ZOFRAN) 4 MG tablet TAKE 1 TABLET BY MOUTH EVERY 8 HOURS AS NEEDED FOR NAUSEA AND VOMITING 04/23/21  Yes Burns, Claudina Lick, MD  predniSONE (DELTASONE) 20 MG tablet Take 2 tablets (40 mg total) by mouth daily with breakfast. 01/27/22  Yes Raylene Everts, MD  SYNTHROID 100 MCG tablet Take 100 mcg by mouth every morning. 11/24/21  Yes [provider]  tiZANidine (ZANAFLEX) 4 MG tablet Take 1 tablet (4 mg total) by mouth every 6 (six) hours as needed for muscle spasms. 04/17/19  Yes Marcial Pacas, MD  topiramate (TOPAMAX) 200 MG tablet Take 200 mg by mouth at bedtime.    Yes [provider]  traMADol (ULTRAM) 50 MG tablet tramadol 50 mg tablet   100 mg by oral route. 08/05/16  Yes [provider]  zolmitriptan (ZOMIG) 5 MG nasal solution PLEASE SEE ATTACHED FOR DETAILED DIRECTIONS 01/19/21  Yes [provider]  baclofen (LIORESAL) 10 MG tablet Take 10 mg by mouth as needed for muscle spasms.    [provider]  linaclotide Rolan Lipa) 72 MCG capsule Take 1 capsule (72 mcg total) by mouth daily before breakfast. 04/23/21   [name], MD  pilocarpine (SALAGEN) 5 MG tablet TAKE 1 TABLET PO TWICE A DAY 03/12/16   [provider]     Family History Family History  Problem Relation Age of Onset   Stroke Mother 57   Diabetes Mother    Kidney failure Mother    AAA (abdominal aortic aneurysm) Father    Stroke Sister 69   Hypertension Sister    Diabetes Sister    Hypertension Sister    Heart attack Maternal Grandmother        ? age   Heart attack Maternal Grandfather        ? age   Diabetes Brother    Kidney failure Brother    Cancer Neg Hx    Breast cancer Neg Hx     Social History Social History  Tobacco Use   Smoking status: Never   Smokeless tobacco: Never  Vaping Use   Vaping Use: Never used  Substance Use Topics   Alcohol use: Not Currently    Comment: rarely   Drug use: No     Allergies   Nsaids   Review of Systems Review of Systems See HPI  Physical Exam Triage Vital Signs ED Triage Vitals  Enc Vitals Group     BP 01/27/22 1706 121/74     Pulse Rate 01/27/22 1706 77     Resp 01/27/22 1706 18     Temp 01/27/22 1706 99.1 F (37.3 C)     Temp Source 01/27/22 1706 Oral     SpO2 01/27/22 1706 99 %     Weight --      Height --      Head Circumference --      Peak Flow --      Pain Score 01/27/22 1708 0     Pain Loc --      Pain Edu? --      Excl. in [place]? --    No data found.  Updated Vital Signs BP 121/74 (BP Location: Right Arm)   Pulse 77   Temp 99.1 F (37.3 C) (Oral)   Resp 18   SpO2 99%       Physical Exam Constitutional:      General: She is not in acute distress.    Appearance: She is well-developed.  HENT:     Head: Normocephalic and atraumatic.     Right Ear: There is impacted cerumen.     Left Ear: There is impacted cerumen.     Nose: Congestion and rhinorrhea present.     Mouth/Throat:     Mouth: Mucous membranes are moist.     Pharynx: Posterior oropharyngeal erythema present.     Comments: Post pharyngeal drainage and lymphoid hyperplasia.  No sinus tenderness Eyes:     Conjunctiva/sclera: Conjunctivae normal.     Pupils: Pupils are equal,  round, and reactive to light.  Cardiovascular:     Rate and Rhythm: Normal rate and regular rhythm.     Heart sounds: Normal heart sounds.  Pulmonary:     Effort: Pulmonary effort is normal. No respiratory distress.     Breath sounds: Wheezing and rhonchi present. No rales.     Comments: Inspiratory wheeze and rhonchi throughout Abdominal:     General: There is no distension.     Palpations: Abdomen is soft.  Musculoskeletal:        General: Normal range of motion.     Cervical back: Normal range of motion.     Right lower leg: No edema.     Left lower leg: No edema.  Skin:    General: Skin is warm and dry.  Neurological:     General: No focal deficit present.     Mental Status: She is alert.  Psychiatric:        Mood and Affect: Mood normal.        Behavior: Behavior normal.      UC Treatments / Results  Labs (all labs ordered are listed, but only abnormal results are displayed) Labs Reviewed - No data to display  EKG   Radiology DG Chest 2 View  Result Date: 01/27/2022 CLINICAL DATA:  Cough. EXAM: CHEST - 2 VIEW COMPARISON:  Chest x-ray 04/19/2021 FINDINGS: The heart size and mediastinal contours are within normal limits. Both lungs are clear. Lower lumbar fusion  hardware is unchanged. No acute fractures are seen. IMPRESSION: No active cardiopulmonary disease. Electronically Signed   By: Ronney Asters M.D.   On: 01/27/2022 17:48    Procedures Procedures (including critical care time)  Medications Ordered in UC Medications - No data to display  Initial Impression / Assessment and Plan / UC Course  I have reviewed the triage vital signs and the nursing notes.  Pertinent labs & imaging results that were available during my care of the patient were reviewed by me and considered in my medical decision making (see chart for details).     The postnasal drip is clearly contributing to the coughing and discomfort.  It may also be contributing to her nausea.  I have  informed patient's is that I will try to expedite her ENT referral by contacting her PCP office. She was given an albuterol inhaler treatment in the office.  This did improve her cough and shortness of breath. We will give her oral prednisone for 5 days. Do not think additional antibiotics are indicated at this time.  Final Clinical Impressions(s) / UC Diagnoses   Final diagnoses:  PND (post-nasal drip)  Acute cough  Wheezing on auscultation     Discharge Instructions      Make sure you are drinking plenty of water Use a humidifier if you have 1 Use the Flonase daily to reduce your postnasal drip Take prednisone daily to help with the swelling and inflammation in your sinuses, and will help reduce your wheezing Use albuterol inhaler for shortness of breath I will give you a cough medicine for bedtime use Call your primary care doctor for follow-up       ED Prescriptions     Medication Sig Dispense Auth. Provider   predniSONE (DELTASONE) 20 MG tablet Take 2 tablets (40 mg total) by mouth daily with breakfast. 10 tablet Raylene Everts, MD   fluticasone [facility]) 50 MCG/ACT nasal spray Place 2 sprays into both nostrils daily. 16 g Raylene Everts, MD   chlorpheniramine-HYDROcodone [facility]) 10-8 MG/5ML Take 5 mLs by mouth every 12 (twelve) hours as needed for cough. 115 mL Raylene Everts, MD      PDMP not reviewed this encounter.   Raylene Everts, MD 01/27/22 [id number]

## 2022-02-03 ENCOUNTER — Other Ambulatory Visit: Payer: Self-pay | Admitting: Gastroenterology

## 2022-02-03 DIAGNOSIS — R1900 Intra-abdominal and pelvic swelling, mass and lump, unspecified site: Secondary | ICD-10-CM

## 2022-02-04 ENCOUNTER — Ambulatory Visit
Admission: RE | Admit: 2022-02-04 | Discharge: 2022-02-04 | Disposition: A | Discharge status: Home / Self Care | Payer: Medicare HMO | Source: Ambulatory Visit | Attending: Gastroenterology | Admitting: Gastroenterology

## 2022-02-04 ENCOUNTER — Other Ambulatory Visit: Payer: Self-pay | Admitting: Gastroenterology

## 2022-02-04 DIAGNOSIS — D509 Iron deficiency anemia, unspecified: Secondary | ICD-10-CM | POA: Diagnosis not present

## 2022-02-04 DIAGNOSIS — R1900 Intra-abdominal and pelvic swelling, mass and lump, unspecified site: Secondary | ICD-10-CM

## 2022-02-08 ENCOUNTER — Other Ambulatory Visit: Payer: Self-pay | Admitting: Family Medicine

## 2022-02-08 DIAGNOSIS — R1900 Intra-abdominal and pelvic swelling, mass and lump, unspecified site: Secondary | ICD-10-CM

## 2022-02-09 ENCOUNTER — Encounter: Payer: Self-pay | Admitting: Internal Medicine

## 2022-02-09 DIAGNOSIS — E890 Postprocedural hypothyroidism: Secondary | ICD-10-CM | POA: Diagnosis not present

## 2022-02-09 DIAGNOSIS — E892 Postprocedural hypoparathyroidism: Secondary | ICD-10-CM | POA: Diagnosis not present

## 2022-02-16 DIAGNOSIS — G894 Chronic pain syndrome: Secondary | ICD-10-CM | POA: Diagnosis not present

## 2022-02-16 DIAGNOSIS — Z79891 Long term (current) use of opiate analgesic: Secondary | ICD-10-CM | POA: Diagnosis not present

## 2022-02-16 DIAGNOSIS — M4325 Fusion of spine, thoracolumbar region: Secondary | ICD-10-CM | POA: Diagnosis not present

## 2022-02-16 DIAGNOSIS — Z79899 Other long term (current) drug therapy: Secondary | ICD-10-CM | POA: Diagnosis not present

## 2022-03-07 DIAGNOSIS — M5116 Intervertebral disc disorders with radiculopathy, lumbar region: Secondary | ICD-10-CM | POA: Diagnosis not present

## 2022-03-10 ENCOUNTER — Telehealth: Payer: Self-pay | Admitting: Internal Medicine

## 2022-03-10 ENCOUNTER — Ambulatory Visit
Admission: RE | Admit: 2022-03-10 | Discharge: 2022-03-10 | Disposition: A | Discharge status: Home / Self Care | Payer: Medicare HMO | Source: Ambulatory Visit | Attending: Family Medicine | Admitting: Family Medicine

## 2022-03-10 ENCOUNTER — Other Ambulatory Visit: Payer: Self-pay | Admitting: Family Medicine

## 2022-03-10 DIAGNOSIS — R1900 Intra-abdominal and pelvic swelling, mass and lump, unspecified site: Secondary | ICD-10-CM

## 2022-03-10 DIAGNOSIS — Z9889 Other specified postprocedural states: Secondary | ICD-10-CM | POA: Diagnosis not present

## 2022-03-10 DIAGNOSIS — N200 Calculus of kidney: Secondary | ICD-10-CM | POA: Diagnosis not present

## 2022-03-10 DIAGNOSIS — S22080A Wedge compression fracture of T11-T12 vertebra, initial encounter for closed fracture: Secondary | ICD-10-CM | POA: Diagnosis not present

## 2022-03-10 NOTE — Telephone Encounter (Signed)
Patient seeing Dr. Ronnald Ramp tomorrow at 8:30 am

## 2022-03-10 NOTE — Telephone Encounter (Signed)
Yes needs f/u.  Needs to f/u today or tomorrow ideally  Needs to be drinking lots of water.

## 2022-03-10 NOTE — Telephone Encounter (Signed)
[facility] called and states the pt's creatinine level is at 3.2  Pt was unsure if she needed an OV with Dr. Quay Burow concerning the issue

## 2022-03-11 ENCOUNTER — Encounter: Payer: Self-pay | Admitting: Internal Medicine

## 2022-03-11 ENCOUNTER — Ambulatory Visit (INDEPENDENT_AMBULATORY_CARE_PROVIDER_SITE_OTHER): Payer: Medicare HMO | Admitting: Internal Medicine

## 2022-03-11 VITALS — BP 116/64 | HR 66 | Temp 98.2°F | Ht 63.5 in | Wt 155.6 lb

## 2022-03-11 DIAGNOSIS — D539 Nutritional anemia, unspecified: Secondary | ICD-10-CM

## 2022-03-11 DIAGNOSIS — E890 Postprocedural hypothyroidism: Secondary | ICD-10-CM | POA: Diagnosis not present

## 2022-03-11 DIAGNOSIS — N184 Chronic kidney disease, stage 4 (severe): Secondary | ICD-10-CM

## 2022-03-11 DIAGNOSIS — N1832 Chronic kidney disease, stage 3b: Secondary | ICD-10-CM

## 2022-03-11 LAB — CBC WITH DIFFERENTIAL/PLATELET
Basophils Absolute: 0 10*3/uL (ref 0.0–0.1)
Basophils Relative: 0.5 % (ref 0.0–3.0)
Eosinophils Absolute: 0.2 10*3/uL (ref 0.0–0.7)
Eosinophils Relative: 2.6 % (ref 0.0–5.0)
HCT: 27.8 % — ABNORMAL LOW (ref 36.0–46.0)
Hemoglobin: 9.5 g/dL — ABNORMAL LOW (ref 12.0–15.0)
Lymphocytes Relative: 47.8 % — ABNORMAL HIGH (ref 12.0–46.0)
Lymphs Abs: 3.1 10*3/uL (ref 0.7–4.0)
MCHC: 34 g/dL (ref 30.0–36.0)
MCV: 98.9 fl (ref 78.0–100.0)
Monocytes Absolute: 0.5 10*3/uL (ref 0.1–1.0)
Monocytes Relative: 7.2 % (ref 3.0–12.0)
Neutro Abs: 2.7 10*3/uL (ref 1.4–7.7)
Neutrophils Relative %: 41.9 % — ABNORMAL LOW (ref 43.0–77.0)
Platelets: 255 10*3/uL (ref 150.0–400.0)
RBC: 2.81 Mil/uL — ABNORMAL LOW (ref 3.87–5.11)
RDW: 13.4 % (ref 11.5–15.5)
WBC: 6.5 10*3/uL (ref 4.0–10.5)

## 2022-03-11 LAB — TSH: TSH: 16.78 u[IU]/mL — ABNORMAL HIGH (ref 0.35–5.50)

## 2022-03-11 LAB — URINALYSIS, ROUTINE W REFLEX MICROSCOPIC
Bilirubin Urine: NEGATIVE
Hgb urine dipstick: NEGATIVE
Ketones, ur: NEGATIVE
Leukocytes,Ua: NEGATIVE
Nitrite: NEGATIVE
Specific Gravity, Urine: <=1.005 — AB (ref 1.000–1.030)
Total Protein, Urine: NEGATIVE
Urine Glucose: NEGATIVE
Urobilinogen, UA: 0.2 (ref 0.0–1.0)
pH: 7 (ref 5.0–8.0)

## 2022-03-11 LAB — HEPATIC FUNCTION PANEL
ALT: 8 U/L (ref 0–35)
AST: 17 U/L (ref 0–37)
Albumin: 4.1 g/dL (ref 3.5–5.2)
Alkaline Phosphatase: 39 U/L (ref 39–117)
Bilirubin, Direct: 0.1 mg/dL (ref 0.0–0.3)
Total Bilirubin: 0.3 mg/dL (ref 0.2–1.2)
Total Protein: 7 g/dL (ref 6.0–8.3)

## 2022-03-11 LAB — BASIC METABOLIC PANEL
BUN: 29 mg/dL — ABNORMAL HIGH (ref 6–23)
CO2: 27 mEq/L (ref 19–32)
Calcium: 9 mg/dL (ref 8.4–10.5)
Chloride: 105 mEq/L (ref 96–112)
Creatinine, Ser: 2.83 mg/dL — ABNORMAL HIGH (ref 0.40–1.20)
GFR: 17.59 mL/min — ABNORMAL LOW (ref >60.00)
Glucose, Bld: 92 mg/dL (ref 70–99)
Potassium: 3.9 mEq/L (ref 3.5–5.1)
Sodium: 140 mEq/L (ref 135–145)

## 2022-03-11 LAB — IBC + FERRITIN
Ferritin: 150.4 ng/mL (ref 10.0–291.0)
Iron: 64 ug/dL (ref 42–145)
Saturation Ratios: 21.1 % (ref 20.0–50.0)
TIBC: 303.8 ug/dL (ref 250.0–450.0)
Transferrin: 217 mg/dL (ref 212.0–360.0)

## 2022-03-11 LAB — FOLATE: Folate: 7.2 ng/mL (ref >5.9)

## 2022-03-11 LAB — VITAMIN B12: Vitamin B-12: 610 pg/mL (ref 211–911)

## 2022-03-11 MED ORDER — LEVOTHYROXINE SODIUM 125 MCG PO TABS: 125.0000 ug | ORAL_TABLET | Freq: Every day | ORAL | 0 refills | Status: DC

## 2022-03-11 NOTE — Patient Instructions (Signed)
Chronic Kidney Disease, Adult Chronic kidney disease (CKD) occurs when the kidneys are slowly and permanently damaged over a long period of time. The kidneys are a pair of organs that do many important jobs in the body, including: Removing waste and extra fluid from the blood to make urine. Making hormones that maintain the amount of fluid in tissues and blood vessels. Maintaining the right amount of fluids and chemicals in the body. A small amount of kidney damage may not cause problems, but a large amount of damage may make it hard or impossible for the kidneys to work right. Steps must be taken to slow kidney damage or to stop it from getting worse. If steps are not taken, the kidneys may stop working permanently (end-stage renal disease, or ESRD). Most of the time, CKD does not go away, but it can often be controlled. People who have CKD are usually able to live full lives. What are the causes? The most common causes of this condition are diabetes and high blood pressure (hypertension). Other causes include: Cardiovascular diseases. These affect the heart and blood vessels. Kidney diseases. These include: Glomerulonephritis, or inflammation of the tiny filters in the kidneys. Interstitial nephritis. This is swelling of the small tubes of the kidneys and of the surrounding structures. Polycystic kidney disease, in which clusters of fluid-filled sacs form within the kidneys. Renal vascular disease. This includes disorders that affect the arteries and veins of the kidneys. Diseases that affect the body's defense system (immune system). A problem with urine flow. This may be caused by: Kidney stones. Cancer. An enlarged prostate, in males. A kidney infection or urinary tract infection (UTI) that keeps coming back. Vasculitis. This is swelling or inflammation of the blood vessels. What increases the risk? Your chances of having kidney disease increase with age. The following factors may make  you more likely to develop this condition: A family history of kidney disease or kidney failure. Kidney failure means the kidneys can no longer work right. Certain genetic diseases. Taking medicines often that are damaging to the kidneys. Being around or being in contact with toxic substances. Obesity. A history of tobacco use. What are the signs or symptoms? Symptoms of this condition include: Feeling very tired (lethargic) and having less energy. Swelling, or edema, of the face, legs, ankles, or feet. Nausea or vomiting, or loss of appetite. Confusion or trouble concentrating. Muscle twitches and cramps, especially in the legs. Dry, itchy skin. A metallic taste in the mouth. Producing less urine, or producing more urine (especially at night). Shortness of breath. Trouble sleeping. CKD may also result in not having enough red blood cells or hemoglobin in the blood (anemia) or having weak bones (bone disease). Symptoms develop slowly and may not be obvious until the kidney damage becomes severe. It is possible to have kidney disease for years without having symptoms. How is this diagnosed? This condition may be diagnosed based on: Blood tests. Urine tests. Imaging tests, such as an ultrasound or a CT scan. A kidney biopsy. This involves removing a sample of kidney tissue to be looked at under a microscope. Results from these tests will help to determine how serious the CKD is. How is this treated? There is no cure for most cases of this condition, but treatment usually relieves symptoms and prevents or slows the worsening of the disease. Treatment may include: Diet changes, which may require you to avoid alcohol and foods that are high in salt, potassium, phosphorous, and protein. Medicines. These may:   Lower blood pressure. Control blood sugar (glucose). Relieve anemia. Relieve swelling. Protect your bones. Improve the balance of salts and minerals in your blood  (electrolytes). Dialysis, which is a type of treatment that removes toxic waste from the body. It may be needed if you have kidney failure. Managing any other conditions that are causing your CKD or making it worse. Follow these instructions at home: Medicines Take over-the-counter and prescription medicines only as told by your health care provider. The amount of some medicines that you take may need to be changed. Do not take any new medicines unless approved by your health care provider. Many medicines can make kidney damage worse. Do not take any vitamin and mineral supplements unless approved by your health care provider. Many nutritional supplements can make kidney damage worse. Lifestyle  Do not use any products that contain nicotine or tobacco, such as cigarettes, e-cigarettes, and chewing tobacco. If you need help quitting, ask your health care provider. If you drink alcohol: Limit how much you use to: 0-1 drink a day for women who are not pregnant. 0-2 drinks a day for men. Know how much alcohol is in your drink. In the [place] one drink equals one 12 oz bottle of beer (355 mL), one 5 oz glass of wine (148 mL), or one 1 oz glass of hard liquor (44 mL). Maintain a healthy weight. If you need help, ask your health care provider. General instructions  Follow instructions from your health care provider about eating or drinking restrictions, including any prescribed diet. Track your blood pressure at home. Report changes in your blood pressure as told. If you are being treated for diabetes, track your blood glucose levels as told. Start or continue an exercise plan. Exercise at least 30 minutes a day, 5 days a week. Keep your immunizations up to date as told. Keep all follow-up visits. This is important. Where to find more information American Association of Kidney Patients: www.aakp.org National Kidney Foundation: www.kidney.org American Kidney Fund: www.akfinc.org Life Options:  www.lifeoptions.org Kidney School: www.kidneyschool.org Contact a health care provider if: Your symptoms get worse. You develop new symptoms. Get help right away if: You develop symptoms of ESRD. These include: Headaches. Numbness in your hands or feet. Easy bruising. Frequent hiccups. Chest pain. Shortness of breath. Lack of menstrual periods, in women. You have a fever. You are producing less urine than usual. You have pain or bleeding when you urinate or when you have a bowel movement. These symptoms may represent a serious problem that is an emergency. Do not wait to see if the symptoms will go away. Get medical help right away. Call your local emergency services (911 in the [place] Do not drive yourself to the hospital. Summary Chronic kidney disease (CKD) occurs when the kidneys become damaged slowly over a long period of time. The most common causes of this condition are diabetes and high blood pressure (hypertension). There is no cure for most cases of CKD, but treatment usually relieves symptoms and prevents or slows the worsening of the disease. Treatment may include a combination of lifestyle changes, medicines, and dialysis. This information is not intended to replace advice given to you by your health care provider. Make sure you discuss any questions you have with your health care provider. Document Revised: 10/23/2019 Document Reviewed: 10/23/2019 Elsevier Patient Education  2023 [employer]vier Inc.  

## 2022-03-11 NOTE — Progress Notes (Unsigned)
Subjective:  Patient ID: Terri Roberts, female    DOB: [birth year]  Age: 61 y.o. MRN: [id number]  CC: Anemia and Chronic Kidney Disease   HPI Terri Roberts presents for a f/up , urgent visit -  She has been having pain over her xiphoid so she underwent a CT without contrast yesterday.  The CT scan showed that the xiphoid is calcified.  She was informed that her creatinine was just above 3.  She complains of chronic constipation alternating with diarrhea.  She says the constipation is well controlled with Trulance.  She has had intermittent nausea and vomiting but she denies chest pain, shortness of breath, or edema.  She has developed tremors in her hands.  She tells me she does not take NSAIDs, does not drink alcohol, and does not smoke cigarettes.  Outpatient Medications Prior to Visit  Medication Sig Dispense Refill   baclofen (LIORESAL) 10 MG tablet Take 10 mg by mouth as needed for muscle spasms.     calcitRIOL (ROCALTROL) 0.5 MCG capsule TAKE TWICE DAILY 60 capsule 0   cycloSPORINE (RESTASIS) 0.05 % ophthalmic emulsion Place 1 drop into both eyes 2 (two) times daily.     EMGALITY 120 MG/ML SOAJ Inject into the skin.     fluticasone (FLONASE) 50 MCG/ACT nasal spray Place 2 sprays into both nostrils daily. 16 g 0   gabapentin (NEURONTIN) 300 MG capsule TAKE 2 CAPSULES (600 MG TOTAL) BY MOUTH 2 (TWO) TIMES DAILY. 90 capsule 0   HYDROcodone-acetaminophen (NORCO) 10-325 MG tablet Take 1 tablet by mouth 2 (two) times daily as needed.     hydroxychloroquine (PLAQUENIL) 200 MG tablet Take 400 mg by mouth daily.      linaclotide (LINZESS) 72 MCG capsule Take 1 capsule (72 mcg total) by mouth daily before breakfast. 30 capsule 5   omeprazole (PRILOSEC) 40 MG capsule Take 1 capsule (40 mg total) by mouth daily. 90 capsule 1   ondansetron (ZOFRAN) 4 MG tablet TAKE 1 TABLET BY MOUTH EVERY 8 HOURS AS NEEDED FOR NAUSEA AND VOMITING 30 tablet 5   pilocarpine (SALAGEN) 5 MG tablet TAKE 1 TABLET PO  TWICE A DAY  3   tiZANidine (ZANAFLEX) 4 MG tablet Take 1 tablet (4 mg total) by mouth every 6 (six) hours as needed for muscle spasms. 30 tablet 6   topiramate (TOPAMAX) 200 MG tablet Take 200 mg by mouth at bedtime.      traMADol (ULTRAM) 50 MG tablet tramadol 50 mg tablet   100 mg by oral route.     zolmitriptan (ZOMIG) 5 MG nasal solution PLEASE SEE ATTACHED FOR DETAILED DIRECTIONS     diclofenac sodium (VOLTAREN) 1 % GEL Apply 4 g topically 4 (four) times daily. 100 g 5   SYNTHROID 100 MCG tablet Take 100 mcg by mouth every morning.     No facility-administered medications prior to visit.    ROS Review of Systems  Constitutional:  Negative for appetite change, chills, diaphoresis, fatigue and fever.  HENT: Negative.    Eyes: Negative.   Respiratory:  Negative for cough, chest tightness, shortness of breath and wheezing.   Cardiovascular:  Negative for chest pain, palpitations and leg swelling.  Gastrointestinal:  Positive for constipation, diarrhea, nausea and vomiting. Negative for abdominal pain.  Endocrine: Negative.   Musculoskeletal:  Negative for arthralgias and myalgias.  Skin: Negative.  Negative for color change and rash.  Allergic/Immunologic: Negative.   Neurological:  Positive for tremors. Negative for dizziness, weakness and  numbness.  Hematological:  Negative for adenopathy. Does not bruise/bleed easily.  Psychiatric/Behavioral: Negative.      Objective:  BP 116/64   Pulse 66   Temp 98.2 F (36.8 C) (Oral)   Ht 5' 3.5" (1.613 m)   Wt 155 lb 9.6 oz (70.6 kg)   BMI 27.13 kg/m   BP Readings from Last 3 Encounters:  03/11/22 116/64  01/27/22 121/74  12/01/21 122/82    Wt Readings from Last 3 Encounters:  03/11/22 155 lb 9.6 oz (70.6 kg)  12/01/21 154 lb (69.9 kg)  04/23/21 165 lb (74.8 kg)    Physical Exam Vitals reviewed.  HENT:     Nose: Nose normal.     Mouth/Throat:     Mouth: Mucous membranes are moist.  Eyes:     General: No scleral  icterus.    Conjunctiva/sclera: Conjunctivae normal.  Cardiovascular:     Rate and Rhythm: Normal rate and regular rhythm.     Heart sounds: No murmur heard. Pulmonary:     Effort: Pulmonary effort is normal.     Breath sounds: No stridor. No wheezing, rhonchi or rales.  Abdominal:     General: Abdomen is flat.     Palpations: There is no mass.     Tenderness: There is no abdominal tenderness. There is no guarding.     Hernia: No hernia is present.  Musculoskeletal:        General: Normal range of motion.     Cervical back: Neck supple.     Right lower leg: No edema.     Left lower leg: No edema.  Lymphadenopathy:     Cervical: No cervical adenopathy.  Skin:    General: Skin is warm and dry.  Neurological:     General: No focal deficit present.     Mental Status: She is alert. Mental status is at baseline.  Psychiatric:        Mood and Affect: Mood normal.        Behavior: Behavior normal.     Lab Results  Component Value Date   WBC 6.5 03/11/2022   HGB 9.5 (L) 03/11/2022   HCT 27.8 (L) 03/11/2022   PLT 255.0 03/11/2022   GLUCOSE 92 03/11/2022   CHOL 189 04/23/2021   TRIG 101.0 04/23/2021   HDL 43.60 04/23/2021   LDLCALC 125 (H) 04/23/2021   ALT 8 03/11/2022   AST 17 03/11/2022   NA 140 03/11/2022   K 3.9 03/11/2022   CL 105 03/11/2022   CREATININE 2.83 (H) 03/11/2022   BUN 29 (H) 03/11/2022   CO2 27 03/11/2022   TSH 16.78 (H) 03/11/2022   HGBA1C 5.3 01/12/2016    CT ABDOMEN WO CONTRAST  Result Date: 03/10/2022 CLINICAL DATA:  Midline mass upper abdomen for 1 year EXAM: CT ABDOMEN WITHOUT CONTRAST TECHNIQUE: Multidetector CT imaging of the abdomen was performed following the standard protocol without IV contrast. RADIATION DOSE REDUCTION: This exam was performed according to the departmental dose-optimization program which includes automated exposure control, adjustment of the mA and/or kV according to patient size and/or use of iterative reconstruction  technique. COMPARISON:  02/04/2022 FINDINGS: Lower chest: No acute pleural or parenchymal lung disease. Hepatobiliary: Unremarkable unenhanced appearance of the liver and gallbladder. Pancreas: Unremarkable unenhanced appearance. Spleen: Unremarkable unenhanced appearance. Adrenals/Urinary Tract: Nonobstructing left renal calculi measuring up to 5 mm. No right-sided calculi. The adrenals are unremarkable. Stomach/Bowel: No bowel obstruction or ileus. No bowel wall thickening or inflammatory change. Vascular/Lymphatic: No significant vascular  findings on this unenhanced exam. No pathologic adenopathy. Other: A vitamin-E capsule is placed on the midline upper abdominal wall at the site of the palpable abnormality. At this location, and corresponding to the previous ultrasound findings, is a calcified xiphoid process which curves ventrally. No evidence of hernia or fluid collection. Musculoskeletal: No acute or destructive bony lesions. Postsurgical changes are seen from T11 through L1, with evidence of a chronic T12 compression fracture. IMPRESSION: 1. Prominent calcified xiphoid process corresponding to the palpable abnormality and previous ultrasound finding. 2. Nonobstructing left renal calculi. Electronically Signed   By: Randa Ngo M.D.   On: 03/10/2022 16:33    Assessment & Plan:   Patt was seen today for anemia and chronic kidney disease.  Diagnoses and all orders for this visit:  Postprocedural hypothyroidism - Endo at [facility]- Her TSH is nearly 17.  Will increase her T4 dosage. -     TSH; Future -     Hepatic function panel; Future -     Hepatic function panel -     TSH -     levothyroxine (SYNTHROID) 125 MCG tablet; Take 1 tablet (125 mcg total) by mouth daily.  Stage 3b chronic kidney disease ([place])- Her renal function has declined.  I recommended that she see her nephrologist as soon as possible. -     Basic metabolic panel; Future -     Urinalysis, Routine w reflex microscopic;  Future -     Hepatic function panel; Future -     Hepatic function panel -     Urinalysis, Routine w reflex microscopic -     Basic metabolic panel  Deficiency anemia- Will evaluate for vitamin deficiencies and secondary causes. -     Vitamin B12; Future -     IBC + Ferritin; Future -     Vitamin B1; Future -     Zinc; Future -     Folate; Future -     CBC with Differential/Platelet; Future -     Hepatic function panel; Future -     Hepatic function panel -     CBC with Differential/Platelet -     Folate -     Zinc -     Vitamin B1 -     IBC + Ferritin -     Vitamin B12  Chronic renal disease, stage 4, severely decreased glomerular filtration rate (GFR) between 15-29 mL/min/1.73 square meter ([employer]) -     Ambulatory referral to Nephrology   I have discontinued Katalyna A. Gaskill's diclofenac sodium and Synthroid. I am also having her start on levothyroxine. Additionally, I am having her maintain her topiramate, cycloSPORINE, hydroxychloroquine, pilocarpine, baclofen, calcitRIOL, traMADol, gabapentin, tiZANidine, zolmitriptan, Emgality, HYDROcodone-acetaminophen, linaclotide, ondansetron, omeprazole, and fluticasone.  Meds ordered this encounter  Medications   levothyroxine (SYNTHROID) 125 MCG tablet    Sig: Take 1 tablet (125 mcg total) by mouth daily.    Dispense:  90 tablet    Refill:  0     Follow-up: Return in about 3 months (around 06/11/2022).  Terri Calico, MD

## 2022-03-15 LAB — ZINC: Zinc: 70 ug/dL (ref 60–130)

## 2022-03-15 LAB — VITAMIN B1: Vitamin B1 (Thiamine): 10 nmol/L (ref 8–30)

## 2022-03-17 DIAGNOSIS — Z79899 Other long term (current) drug therapy: Secondary | ICD-10-CM | POA: Diagnosis not present

## 2022-03-17 DIAGNOSIS — K3184 Gastroparesis: Secondary | ICD-10-CM | POA: Diagnosis not present

## 2022-03-17 DIAGNOSIS — R0982 Postnasal drip: Secondary | ICD-10-CM | POA: Diagnosis not present

## 2022-03-17 DIAGNOSIS — E890 Postprocedural hypothyroidism: Secondary | ICD-10-CM | POA: Diagnosis not present

## 2022-03-17 DIAGNOSIS — N184 Chronic kidney disease, stage 4 (severe): Secondary | ICD-10-CM | POA: Diagnosis not present

## 2022-03-17 DIAGNOSIS — M3500 Sicca syndrome, unspecified: Secondary | ICD-10-CM | POA: Diagnosis not present

## 2022-03-17 DIAGNOSIS — R0981 Nasal congestion: Secondary | ICD-10-CM | POA: Diagnosis not present

## 2022-03-17 DIAGNOSIS — Z9889 Other specified postprocedural states: Secondary | ICD-10-CM | POA: Diagnosis not present

## 2022-03-17 DIAGNOSIS — Z79890 Hormone replacement therapy (postmenopausal): Secondary | ICD-10-CM | POA: Diagnosis not present

## 2022-03-22 DIAGNOSIS — M4325 Fusion of spine, thoracolumbar region: Secondary | ICD-10-CM | POA: Diagnosis not present

## 2022-03-29 DIAGNOSIS — Z6826 Body mass index (BMI) 26.0-26.9, adult: Secondary | ICD-10-CM | POA: Diagnosis not present

## 2022-03-29 DIAGNOSIS — D631 Anemia in chronic kidney disease: Secondary | ICD-10-CM | POA: Diagnosis not present

## 2022-03-29 DIAGNOSIS — N1832 Chronic kidney disease, stage 3b: Secondary | ICD-10-CM | POA: Diagnosis not present

## 2022-03-29 DIAGNOSIS — M65331 Trigger finger, right middle finger: Secondary | ICD-10-CM | POA: Diagnosis not present

## 2022-03-29 DIAGNOSIS — M797 Fibromyalgia: Secondary | ICD-10-CM | POA: Diagnosis not present

## 2022-03-29 DIAGNOSIS — M3501 Sicca syndrome with keratoconjunctivitis: Secondary | ICD-10-CM | POA: Diagnosis not present

## 2022-03-29 DIAGNOSIS — M5030 Other cervical disc degeneration, unspecified cervical region: Secondary | ICD-10-CM | POA: Diagnosis not present

## 2022-03-29 DIAGNOSIS — R634 Abnormal weight loss: Secondary | ICD-10-CM | POA: Diagnosis not present

## 2022-03-29 DIAGNOSIS — E663 Overweight: Secondary | ICD-10-CM | POA: Diagnosis not present

## 2022-04-01 DIAGNOSIS — M7061 Trochanteric bursitis, right hip: Secondary | ICD-10-CM | POA: Diagnosis not present

## 2022-04-01 DIAGNOSIS — M25559 Pain in unspecified hip: Secondary | ICD-10-CM | POA: Diagnosis not present

## 2022-04-06 ENCOUNTER — Ambulatory Visit
Admission: EM | Admit: 2022-04-06 | Discharge: 2022-04-06 | Disposition: A | Discharge status: Home / Self Care | Payer: Medicare HMO | Attending: Family Medicine | Admitting: Family Medicine

## 2022-04-06 ENCOUNTER — Ambulatory Visit: Payer: Medicare HMO

## 2022-04-06 ENCOUNTER — Ambulatory Visit (INDEPENDENT_AMBULATORY_CARE_PROVIDER_SITE_OTHER): Payer: Medicare HMO

## 2022-04-06 DIAGNOSIS — S42294A Other nondisplaced fracture of upper end of right humerus, initial encounter for closed fracture: Secondary | ICD-10-CM | POA: Diagnosis not present

## 2022-04-06 DIAGNOSIS — H524 Presbyopia: Secondary | ICD-10-CM | POA: Diagnosis not present

## 2022-04-06 DIAGNOSIS — M25511 Pain in right shoulder: Secondary | ICD-10-CM | POA: Diagnosis not present

## 2022-04-06 DIAGNOSIS — Z79899 Other long term (current) drug therapy: Secondary | ICD-10-CM | POA: Diagnosis not present

## 2022-04-06 MED ORDER — ACETAMINOPHEN 325 MG PO TABS
650.0000 mg | ORAL_TABLET | Freq: Once | ORAL | Status: AC
Start: 2022-04-06 — End: 2022-04-06
  Administered 2022-04-06: 650 mg via ORAL

## 2022-04-06 MED ORDER — OXYCODONE-ACETAMINOPHEN 5-325 MG PO TABS: 1.0000 | ORAL_TABLET | Freq: Four times a day (QID) | ORAL | 0 refills | Status: DC | PRN

## 2022-04-06 NOTE — ED Triage Notes (Signed)
Pt presents to Urgent Care with c/o R shoulder pain and limited ROM after injury this AM. Pt reports falling and hyperextending her R arm to catch herself.

## 2022-04-06 NOTE — ED Provider Notes (Signed)
[name]    CSN: [id number] Arrival date & time: 04/06/22  1122      History   Chief Complaint Chief Complaint  Patient presents with   Shoulder Pain    HPI Terri Roberts is a 61 y.o. female.   Patient accidentally fell forward this morning, hyperextending her right shoulder.  She has had persistent shoulder pain and limited range of motion.  The history is provided by the patient.  Shoulder Injury This is a new problem. The current episode started 3 to 5 hours ago. The problem occurs constantly. The problem has not changed since onset.Pertinent negatives include no chest pain. Exacerbated by: shoulder movement. Nothing relieves the symptoms. Treatments tried: hydrococone. The treatment provided mild relief.    Past Medical History:  Diagnosis Date   Fibromyalgia    Migraine    Dr Domingo Cocking   Peripheral neuropathy    Sjogren's syndrome ([place])    Dr Ouida Sills   Thyroid nodule     Patient Active Problem List   Diagnosis Date Noted   Deficiency anemia 03/11/2022   Chronic renal disease, stage 4, severely decreased glomerular filtration rate (GFR) between 15-29 mL/min/1.73 square meter ([place]) 03/11/2022   Nausea and vomiting 12/01/2021   Fibromuscular dysplasia of both carotid arteries ([place]) 05/13/2020   Right carotid bruit 04/20/2020   Sensation disturbance of skin 04/20/2020   Chronic back pain 07/24/2019   GERD (gastroesophageal reflux disease) 07/24/2019   Migraine-Dr. Domingo Cocking 04/17/2019   Low back pain 04/17/2019   Chronic pain of left knee 11/28/2018   Anxiety 08/01/2018   Spinal stenosis 08/01/2018   Tingling in extremities 12/02/2017   Vitamin D deficiency 05/09/2017   CKD (chronic kidney disease)-Dr. Carmina Miller 10/24/2016   Normocytic anemia 10/24/2016   Periodic limb movement 10/09/2016   T12 burst fracture ([place]) 08/05/2016   OSA on CPAP 04/01/2016   Insomnia 01/12/2016   Chronic pain syndrome 01/12/2016   Joint pain 01/12/2016   Numbness  and tingling 01/12/2016   Chronic fatigue 01/12/2016   Depression 01/12/2016   Neck pain 06/10/2015   Fibromyalgia-Dr. Amil Amen 03/04/2015   Postprocedural hypothyroidism - [facility] at [facility] 03/04/2015   Postprocedural hypoparathyroidism ([place]) 03/04/2015   Sjogren's syndrome ([facility])-Dr. Amil Amen 11/01/2012   Chronic constipation 05/14/2009   THYROID NODULE 03/10/2009   PALPITATIONS 03/03/2009    Past Surgical History:  Procedure Laterality Date   BACK SURGERY     Intrauterine Ablation  08/01/2009   Dr Raphael Gibney ( now seeing Dr Christophe Louis)   lip biopsy  08/01/2012   Dr Thea Gist   MANDIBLE SURGERY     TMJ    THYROIDECTOMY  06/01/2009   benign nodule   TUBAL LIGATION      OB History   No obstetric history on file.      Home Medications    Prior to Admission medications   Medication Sig Start Date End Date Taking? Authorizing Provider  baclofen (LIORESAL) 10 MG tablet Take 10 mg by mouth as needed for muscle spasms.    [provider]  calcitRIOL (ROCALTROL) 0.5 MCG capsule TAKE TWICE DAILY 06/20/18   Elayne Snare, MD  cycloSPORINE (RESTASIS) 0.05 % ophthalmic emulsion Place 1 drop into both eyes 2 (two) times daily.    [provider]  EMGALITY 120 MG/ML SOAJ Inject into the skin. 01/19/21   [provider]  fluticasone (FLONASE) 50 MCG/ACT nasal spray Place 2 sprays into both nostrils daily. 01/27/22   Raylene Everts, MD  gabapentin (  NEURONTIN) 300 MG capsule TAKE 2 CAPSULES (600 MG TOTAL) BY MOUTH 2 (TWO) TIMES DAILY. 08/21/18   [name], MD  hydroxychloroquine (PLAQUENIL) 200 MG tablet Take 400 mg by mouth daily.     [provider]  levothyroxine (SYNTHROID) 125 MCG tablet Take 1 tablet (125 mcg total) by mouth daily. 03/11/22   Janith Lima, MD  linaclotide [facility]) 72 MCG capsule Take 1 capsule (72 mcg total) by mouth daily before breakfast. 04/23/21   Burns, Claudina Lick, MD  omeprazole (PRILOSEC) 40 MG capsule Take 1 capsule (40 mg  total) by mouth daily. 01/25/22   Burns, Claudina Lick, MD  ondansetron (ZOFRAN) 4 MG tablet TAKE 1 TABLET BY MOUTH EVERY 8 HOURS AS NEEDED FOR NAUSEA AND VOMITING 04/23/21   [name], MD  oxyCODONE-acetaminophen (PERCOCET) 5-325 MG tablet Take 1 tablet by mouth every 6 (six) hours as needed for severe pain or moderate pain. 04/06/22 04/06/23 Yes Kandra Nicolas, MD  pilocarpine (SALAGEN) 5 MG tablet TAKE 1 TABLET PO TWICE A DAY 03/12/16   [provider]  tiZANidine (ZANAFLEX) 4 MG tablet Take 1 tablet (4 mg total) by mouth every 6 (six) hours as needed for muscle spasms. 04/17/19   Marcial Pacas, MD  topiramate (TOPAMAX) 200 MG tablet Take 200 mg by mouth at bedtime.     [provider]  traMADol (ULTRAM) 50 MG tablet tramadol 50 mg tablet   100 mg by oral route. 08/05/16   [provider]  zolmitriptan (ZOMIG) 5 MG nasal solution PLEASE SEE ATTACHED FOR DETAILED DIRECTIONS 01/19/21   [provider]    Family History Family History  Problem Relation Age of Onset   Stroke Mother 61   Diabetes Mother    Kidney failure Mother    AAA (abdominal aortic aneurysm) Father    Stroke Sister 8   Hypertension Sister    Diabetes Sister    Hypertension Sister    Heart attack Maternal Grandmother        ? age   Heart attack Maternal Grandfather        ? age   Diabetes Brother    Kidney failure Brother    Cancer Neg Hx    Breast cancer Neg Hx     Social History Social History   Tobacco Use   Smoking status: Never   Smokeless tobacco: Never  Vaping Use   Vaping Use: Never used  Substance Use Topics   Alcohol use: Not Currently    Comment: rarely   Drug use: Yes    Frequency: 2.0 times per week    Types: Marijuana    Comment: weekly     Allergies   Nsaids   Review of Systems Review of Systems  Constitutional:  Positive for activity change.  HENT: Negative.    Eyes: Negative.   Respiratory: Negative.    Cardiovascular:  Negative for chest pain.   Gastrointestinal: Negative.   Musculoskeletal:  Negative for neck pain.       Right shoulder pain  Skin:  Negative for color change.  Neurological:  Negative for numbness.  All other systems reviewed and are negative.    Physical Exam Triage Vital Signs ED Triage Vitals  Enc Vitals Group     BP 04/06/22 1211 128/79     Pulse Rate 04/06/22 1211 60     Resp 04/06/22 1211 20     Temp 04/06/22 1211 98.5 F (36.9 C)     Temp Source  04/06/22 1211 Oral     SpO2 04/06/22 1211 99 %     Weight 04/06/22 1205 152 lb (68.9 kg)     Height 04/06/22 1205 5' 3.5" (1.613 m)     Head Circumference --      Peak Flow --      Pain Score 04/06/22 1205 9     Pain Loc --      Pain Edu? --      Excl. in [place]? --    No data found.  Updated Vital Signs BP 128/79 (BP Location: Left Arm)   Pulse 60   Temp 98.5 F (36.9 C) (Oral)   Resp 20   Ht 5' 3.5" (1.613 m)   Wt 68.9 kg   SpO2 99%   BMI 26.50 kg/m   Visual Acuity Right Eye Distance:   Left Eye Distance:   Bilateral Distance:    Right Eye Near:   Left Eye Near:    Bilateral Near:     Physical Exam Vitals and nursing note reviewed.  Constitutional:      General: She is not in acute distress. HENT:     Head: Normocephalic.  Eyes:     Pupils: Pupils are equal, round, and reactive to light.  Cardiovascular:     Rate and Rhythm: Normal rate and regular rhythm.     Heart sounds: Normal heart sounds.  Pulmonary:     Breath sounds: Normal breath sounds.  Musculoskeletal:        General: Tenderness present. No deformity.     Right shoulder: Tenderness and bony tenderness present. Decreased range of motion.       Arms:     Cervical back: Normal range of motion.     Comments: Right shoulder has decreased range of motion with tenderness to palpation over proximal humerus.  Skin:    General: Skin is warm and dry.  Neurological:     General: No focal deficit present.     Mental Status: She is alert.      UC Treatments /  Results  Labs (all labs ordered are listed, but only abnormal results are displayed) Labs Reviewed - No data to display  EKG   Radiology DG Shoulder Right  Result Date: 04/06/2022 CLINICAL DATA:  61 year-old female c/o RIGHT shoulder pain and limited ROM after injury this AM. Pt reports falling and hyperextending her R arm to catch herself. EXAM: RIGHT SHOULDER - 2+ VIEW COMPARISON:  None Available. FINDINGS: Subtle nondisplaced fracture of the greater tuberosity. No fracture comminution. No other evidence of a fracture. Glenohumeral and AC joints are normally spaced and aligned. Surrounding soft tissues are unremarkable. IMPRESSION: 1. Subtle nondisplaced, non comminuted fracture of the right proximal humerus greater tuberosity. Electronically Signed   By: Lajean Manes M.D.   On: 04/06/2022 12:49    Procedures Procedures (including critical care time)  Medications Ordered in UC Medications  acetaminophen (TYLENOL) tablet 650 mg (650 mg Oral Given 04/06/22 1217)    Initial Impression / Assessment and Plan / UC Course  I have reviewed the triage vital signs and the nursing notes.  Pertinent labs & imaging results that were available during my care of the patient were reviewed by me and considered in my medical decision making (see chart for details).    Dispensed sling.  Rx for Percocet (#12, no refill). Controlled Substance Prescriptions I have consulted the [place] Controlled Substances Registry for this patient, and feel the risk/benefit ratio today is favorable for  proceeding with this prescription for a controlled substance.  Followup with Dr. Aundria Mems ([facility]) in about 5 days.   Final Clinical Impressions(s) / UC Diagnoses   Final diagnoses:  Other closed nondisplaced fracture of proximal end of right humerus, initial encounter     Discharge Instructions      Do not take tramadol while taking Percocet. Wear sling.  Apply ice pack for 20 to 30  minutes, 3 to 4 times daily  Continue until pain and swelling decrease. Avoid use of right shoulder.    ED Prescriptions     Medication Sig Dispense Auth. Provider   oxyCODONE-acetaminophen (PERCOCET) 5-325 MG tablet Take 1 tablet by mouth every 6 (six) hours as needed for severe pain or moderate pain. 12 tablet Kandra Nicolas, MD         Kandra Nicolas, MD 04/09/22 [phone]

## 2022-04-06 NOTE — Discharge Instructions (Addendum)
Do not take tramadol while taking Percocet. Wear sling.  Apply ice pack for 20 to 30 minutes, 3 to 4 times daily  Continue until pain and swelling decrease. Avoid use of right shoulder.

## 2022-04-12 ENCOUNTER — Ambulatory Visit (INDEPENDENT_AMBULATORY_CARE_PROVIDER_SITE_OTHER): Payer: Medicare HMO | Admitting: Sports Medicine

## 2022-04-12 VITALS — BP 132/70 | Ht 63.5 in | Wt 146.0 lb

## 2022-04-12 DIAGNOSIS — M25511 Pain in right shoulder: Secondary | ICD-10-CM

## 2022-04-12 DIAGNOSIS — S42294A Other nondisplaced fracture of upper end of right humerus, initial encounter for closed fracture: Secondary | ICD-10-CM

## 2022-04-12 NOTE — Progress Notes (Signed)
   Subjective:    Patient ID: Terri Roberts, female    DOB: 02-20-1961, 61 y.o.   MRN: [id number]  HPI chief complaint: Right shoulder pain  Patient is a very pleasant right-hand-dominant 61 year old female that comes in today after having injured her right shoulder 6 days ago.  She fell in her laundry room injuring her right shoulder.  She was seen in the emergency room and x-rays there showed a nondisplaced noncomminuted fracture of the right proximal humerus greater tuberosity.  She was placed into a sling and swath and comes in today for follow-up.  Her pain is along the lateral shoulder.  She has developed some swelling and bruising distally to this.  She denies any problems with the shoulder in the past.  Past medical history reviewed Medications reviewed Allergies reviewed    Review of Systems As above    Objective:   Physical Exam  Well-developed, well-nourished.  No acute distress  Right shoulder: There is a mild amount of soft tissue swelling around the proximal humerus.  There is some ecchymosis along the proximal to mid humerus.  She is tender to palpation directly over the area of the greater tuberosity.  Limited range of motion secondary to pain.  Good pulses distally.  X-rays of the right shoulder as above      Assessment & Plan:   1 week status post nondisplaced proximal humerus fracture, right shoulder  Patient will need to wear her sling for the next 3 weeks.  She will remove the sling daily starting next week to start early range of motion exercises.  Follow-up with me in 3 weeks for reevaluation and repeat x-rays.  Call with questions or concerns in the interim.  This note was dictated using Dragon naturally speaking software and may contain errors in syntax, spelling, or content which have not been identified prior to signing this note.

## 2022-04-12 NOTE — Patient Instructions (Signed)
It was nice meeting you today!  You will need to wear the sling for 3 more weeks.  However, next Wednesday you can start removing the sling several times a day and doing some simple range of motion exercises to help prevent stiffness.  These exercises should not be painful.  I will see you again in 3 weeks.  Go by [facility] and get follow-up x-rays on your shoulder prior to that office visit.  Just call or message me if you have any questions or concerns in the meantime.

## 2022-04-23 ENCOUNTER — Other Ambulatory Visit: Payer: Self-pay | Admitting: Internal Medicine

## 2022-05-03 ENCOUNTER — Ambulatory Visit: Payer: Medicare HMO | Admitting: Sports Medicine

## 2022-05-05 ENCOUNTER — Ambulatory Visit
Admission: RE | Admit: 2022-05-05 | Discharge: 2022-05-05 | Disposition: A | Discharge status: Home / Self Care | Payer: Medicare HMO | Source: Ambulatory Visit | Attending: Sports Medicine | Admitting: Sports Medicine

## 2022-05-05 ENCOUNTER — Ambulatory Visit (INDEPENDENT_AMBULATORY_CARE_PROVIDER_SITE_OTHER): Payer: Medicare HMO | Admitting: Sports Medicine

## 2022-05-05 VITALS — BP 126/76 | Ht 63.5 in

## 2022-05-05 DIAGNOSIS — M25511 Pain in right shoulder: Secondary | ICD-10-CM

## 2022-05-05 NOTE — Progress Notes (Signed)
  Terri Roberts - 61 y.o. female MRN [id number]  Date of birth: May 16, 1961    CHIEF COMPLAINT:   Proximal humerus fracture follow-up    SUBJECTIVE:   HPI:  Very pleasant 81 lady comes to clinic to be evaluated for proximal humerus fracture follow-up.  On 9/6 she fell on her right shoulder and suffered a nondisplaced fracture of the greater tuberosity of the humerus.  She has been in a sling for the past 4 weeks now.  She is taking it off periodically to practice her pendulum exercises.  She has been taking tramadol as needed for pain, which she already takes for other chronic pain issues.  Overall she is feeling a lot better.  She still has some stiffness in the shoulder.  She is pleased with her progress and ready to get rid of the sling.  Her x-rays from earlier today show excellent callus formation but there has been some slight lateral displacement of the fracture.  ROS:     See HPI  PERTINENT  PMH / PSH FH / / SH:  Past Medical, Surgical, Social, and Family History Reviewed & Updated in the EMR.  Pertinent findings include:  None  OBJECTIVE: BP 126/76   Ht 5' 3.5" (1.613 m)   BMI 25.46 kg/m   Physical Exam:  Vital signs are reviewed.  GEN: Alert and oriented, NAD Pulm: Breathing unlabored PSY: normal mood, congruent affect  MSK: Right shoulder -no obvious deformity.  Nontender to palpation over the humeral head.  Active range of motion forward flexion to 45 degrees, abduction to 45 degrees, external rotation 15 degrees.  Grip strength 5/5.  Neurovascularly intact distally.  ASSESSMENT & PLAN:  1.  Minimally displaced fracture of the right proximal humerus greater tuberosity  -Given the slight displacement of the fracture when compared to the original x-ray, I would like to elicit the input of one of the orthopedic surgeons just to ensure that no further work-up or treatment is needed other than continued conservative care.  I will refer the patient to Dr. Tamera Punt for this  purpose.  In the meantime, she is to discontinue the sling altogether and will start range of motion exercises and physical therapy.  I will defer further treatment to the discretion of Dr. Tamera Punt and she can follow-up with me as needed.  Dortha Kern, MD PGY-4, Sports Medicine Fellow [place]  Patient seen and evaluated with the fellow.  I agree with the above plan of care.  Referral to Dr. Tamera Punt for continued care for this slightly displaced greater tuberosity fracture.  Follow-up with me as needed.

## 2022-05-05 NOTE — Patient Instructions (Addendum)
Take off the sling! Wahoo! Work on ROM at physical therapy and at home. The shoulder might get sore but that is expected. Let's see you again and get another x ray in 1 month! It was nice to meet you!  - Dr. Yong Channel Rafoth   Pt is tenatively set to see Dr Tamera Punt at [facility] on Wed 05/25/22 at 345p PENDING pt calling regarding financial balance.

## 2022-05-14 ENCOUNTER — Other Ambulatory Visit: Payer: Self-pay | Admitting: Internal Medicine

## 2022-05-26 DIAGNOSIS — G518 Other disorders of facial nerve: Secondary | ICD-10-CM | POA: Diagnosis not present

## 2022-05-26 DIAGNOSIS — M791 Myalgia, unspecified site: Secondary | ICD-10-CM | POA: Diagnosis not present

## 2022-05-26 DIAGNOSIS — G43019 Migraine without aura, intractable, without status migrainosus: Secondary | ICD-10-CM | POA: Diagnosis not present

## 2022-05-26 DIAGNOSIS — M542 Cervicalgia: Secondary | ICD-10-CM | POA: Diagnosis not present

## 2022-05-28 ENCOUNTER — Emergency Department (HOSPITAL_COMMUNITY)
Admission: EM | Admit: 2022-05-28 | Discharge: 2022-05-28 | Disposition: A | Discharge status: Home / Self Care | Payer: Medicare HMO | Attending: Emergency Medicine | Admitting: Emergency Medicine

## 2022-05-28 ENCOUNTER — Other Ambulatory Visit: Payer: Self-pay

## 2022-05-28 ENCOUNTER — Encounter (HOSPITAL_COMMUNITY): Payer: Self-pay

## 2022-05-28 DIAGNOSIS — Z79899 Other long term (current) drug therapy: Secondary | ICD-10-CM | POA: Diagnosis not present

## 2022-05-28 DIAGNOSIS — E039 Hypothyroidism, unspecified: Secondary | ICD-10-CM | POA: Diagnosis not present

## 2022-05-28 DIAGNOSIS — N183 Chronic kidney disease, stage 3 unspecified: Secondary | ICD-10-CM | POA: Insufficient documentation

## 2022-05-28 DIAGNOSIS — G43701 Chronic migraine without aura, not intractable, with status migrainosus: Secondary | ICD-10-CM | POA: Diagnosis not present

## 2022-05-28 DIAGNOSIS — R519 Headache, unspecified: Secondary | ICD-10-CM | POA: Diagnosis present

## 2022-05-28 MED ORDER — LACTATED RINGERS IV BOLUS
1000.0000 mL | Freq: Once | INTRAVENOUS | Status: AC
  Administered 2022-05-28: 1000 mL via INTRAVENOUS

## 2022-05-28 MED ORDER — PROCHLORPERAZINE EDISYLATE 10 MG/2ML IJ SOLN
10.0000 mg | Freq: Once | INTRAMUSCULAR | Status: AC
  Administered 2022-05-28: 10 mg via INTRAVENOUS
  Filled 2022-05-28: qty 2

## 2022-05-28 MED ORDER — DIHYDROERGOTAMINE MESYLATE 1 MG/ML IJ SOLN
1.0000 mg | Freq: Once | INTRAMUSCULAR | Status: AC
  Administered 2022-05-28: 1 mg via INTRAVENOUS
  Filled 2022-05-28: qty 1

## 2022-05-28 MED ORDER — ACETAMINOPHEN 500 MG PO TABS
1000.0000 mg | ORAL_TABLET | Freq: Four times a day (QID) | ORAL | Status: DC | PRN
  Administered 2022-05-28: 1000 mg via ORAL
  Filled 2022-05-28: qty 2

## 2022-05-28 MED ORDER — DIPHENHYDRAMINE HCL 50 MG/ML IJ SOLN
25.0000 mg | Freq: Once | INTRAMUSCULAR | Status: AC
  Administered 2022-05-28: 25 mg via INTRAVENOUS
  Filled 2022-05-28: qty 1

## 2022-05-28 MED ORDER — METOCLOPRAMIDE HCL 5 MG/ML IJ SOLN
10.0000 mg | Freq: Once | INTRAMUSCULAR | Status: AC
  Administered 2022-05-28: 10 mg via INTRAVENOUS
  Filled 2022-05-28: qty 2

## 2022-05-28 MED ORDER — VALPROATE SODIUM 100 MG/ML IV SOLN: 800.0000 mg | Freq: Once | INTRAVENOUS | Status: DC

## 2022-05-28 NOTE — Discharge Instructions (Addendum)
I am glad that you are feeling better after the medications given to you here today. Please follow up with headache clinic for further management.

## 2022-05-28 NOTE — ED Provider Notes (Signed)
[facility] DEPT Provider Note   CSN: [id number] Arrival date & time: 05/28/22  0801     History  Chief Complaint  Patient presents with   Migraine    Terri Roberts is a 61 y.o. female with history of frequent migraines, Sjogren's, CKD stage III, postprocedural hypothyroidism who presents to the emergency department for evaluation of intractable migraine that started yesterday.  Patient typically follows with the [facility] and saw them on Tuesday where she received a steroid injection.  Symptoms had improved until again yesterday when she states that she bent forward to pick something up, which triggered a migraine.  She has had 3 total migraines over the last week.  She currently takes Topamax for her migraines.  Endorses some vision changes and nausea and vomiting.  Denies fever, chills, recent trauma.   Migraine       Home Medications Prior to Admission medications   Medication Sig Start Date End Date Taking? Authorizing Provider  baclofen (LIORESAL) 10 MG tablet Take 10 mg by mouth as needed for muscle spasms.    [provider]  calcitRIOL (ROCALTROL) 0.5 MCG capsule TAKE TWICE DAILY 06/20/18   Elayne Snare, MD  cycloSPORINE (RESTASIS) 0.05 % ophthalmic emulsion Place 1 drop into both eyes 2 (two) times daily.    [provider]  EMGALITY 120 MG/ML SOAJ Inject into the skin. 01/19/21   [provider]  fluticasone (FLONASE) 50 MCG/ACT nasal spray Place 2 sprays into both nostrils daily. 01/27/22   Raylene Everts, MD  gabapentin (NEURONTIN) 300 MG capsule TAKE 2 CAPSULES (600 MG TOTAL) BY MOUTH 2 (TWO) TIMES DAILY. 08/21/18   [name], MD  hydroxychloroquine (PLAQUENIL) 200 MG tablet Take 400 mg by mouth daily.     [provider]  levothyroxine (SYNTHROID) 125 MCG tablet Take 1 tablet (125 mcg total) by mouth daily. 03/11/22   Janith Lima, MD  linaclotide [facility]) 72 MCG capsule Take  1 capsule (72 mcg total) by mouth daily before breakfast. 04/23/21   Burns, Claudina Lick, MD  omeprazole (PRILOSEC) 40 MG capsule TAKE 1 CAPSULE (40 MG TOTAL) BY MOUTH DAILY. 04/25/22   Burns, Claudina Lick, MD  ondansetron (ZOFRAN) 4 MG tablet TAKE 1 TABLET BY MOUTH EVERY 8 HOURS AS NEEDED FOR NAUSEA AND VOMITING 05/16/22   Burns, Claudina Lick, MD  oxyCODONE-acetaminophen (PERCOCET) 5-325 MG tablet Take 1 tablet by mouth every 6 (six) hours as needed for severe pain or moderate pain. 04/06/22 04/06/23  Kandra Nicolas, MD  pilocarpine (SALAGEN) 5 MG tablet TAKE 1 TABLET PO TWICE A DAY 03/12/16   [provider]  tiZANidine (ZANAFLEX) 4 MG tablet Take 1 tablet (4 mg total) by mouth every 6 (six) hours as needed for muscle spasms. 04/17/19   Marcial Pacas, MD  topiramate (TOPAMAX) 200 MG tablet Take 200 mg by mouth at bedtime.     [provider]  traMADol (ULTRAM) 50 MG tablet tramadol 50 mg tablet   100 mg by oral route. 08/05/16   [provider]  zolmitriptan (ZOMIG) 5 MG nasal solution PLEASE SEE ATTACHED FOR DETAILED DIRECTIONS 01/19/21   [provider]      Allergies    Nsaids    Review of Systems   Review of Systems  Physical Exam Updated Vital Signs BP (!) 149/67   Pulse 64   Temp 97.9 F (36.6 C) (Oral)   Resp 16   SpO2 97%  Physical Exam  Vitals and nursing note reviewed.  Constitutional:      General: She is not in acute distress.    Appearance: She is ill-appearing.     Comments: Sitting in darkened room, vomiting antibiotic  HENT:     Head: Atraumatic.  Eyes:     Extraocular Movements: Extraocular movements intact.     Conjunctiva/sclera: Conjunctivae normal.     Pupils: Pupils are equal, round, and reactive to light.  Cardiovascular:     Rate and Rhythm: Normal rate and regular rhythm.     Pulses: Normal pulses.     Heart sounds: No murmur heard. Pulmonary:     Effort: Pulmonary effort is normal. No respiratory distress.     Breath sounds: Normal  breath sounds.  Abdominal:     General: Abdomen is flat. There is no distension.     Palpations: Abdomen is soft.     Tenderness: There is no abdominal tenderness. There is no right CVA tenderness or left CVA tenderness.  Musculoskeletal:        General: Normal range of motion.     Cervical back: Normal range of motion.  Skin:    General: Skin is warm and dry.     Capillary Refill: Capillary refill takes less than 2 seconds.  Neurological:     General: No focal deficit present.     Mental Status: She is alert.  Psychiatric:        Mood and Affect: Mood normal.     ED Results / Procedures / Treatments   Labs (all labs ordered are listed, but only abnormal results are displayed) Labs Reviewed - No data to display  EKG None  Radiology No results found.  Procedures Procedures    Medications Ordered in ED Medications  acetaminophen (TYLENOL) tablet 1,000 mg (1,000 mg Oral Given 05/28/22 0845)  lactated ringers bolus 1,000 mL (0 mLs Intravenous Stopped 05/28/22 1038)  prochlorperazine (COMPAZINE) injection 10 mg (10 mg Intravenous Given 05/28/22 0844)  diphenhydrAMINE (BENADRYL) injection 25 mg (25 mg Intravenous Given 05/28/22 0844)  dihydroergotamine (DHE) injection 1 mg (1 mg Intravenous Given 05/28/22 1119)  metoCLOPramide (REGLAN) injection 10 mg (10 mg Intravenous Given 05/28/22 1119)    ED Course/ Medical Decision Making/ A&P                           Medical Decision Making Risk OTC drugs. Prescription drug management.   61 year old female presents emergency department for evaluation of intractable migraine.  Comorbidities as described above in HPI.  No social factors.  No recent trauma or injury so I did not obtain any CT imaging today.  I ordered 1 L of fluids along with Tylenol 1 g, Compazine 10 mg and Benadryl 25 mg.  On reassessment, she reported having some improvement in pain, however after completion of her fluids, she states that pain had returned to  previous intensity.  I then ordered 1 mg DHE along with 10 mg Reglan as secondary abortive therapy.  Upon reassessment, patient was feeling much better with clinical improvement in symptoms.  Instructed her to follow-up with her headache specialist for further management.  Patient expresses understanding and is amenable to plan.  Discharged home in stable condition.  Final Clinical Impression(s) / ED Diagnoses Final diagnoses:  Chronic migraine without aura with status migrainosus, not intractable    Rx / DC Orders ED Discharge Orders     None  Tonye Pearson, [place] 05/28/22 1311    Lajean Saver, MD 05/31/22 1124

## 2022-05-28 NOTE — ED Triage Notes (Addendum)
Patient said she has had a migraine since Friday. Behind both of her eyes and at the base of her head. Went to migraine doctor, he gave her injections this week and her pain never went away. Vomiting in triage.

## 2022-05-31 DIAGNOSIS — G43719 Chronic migraine without aura, intractable, without status migrainosus: Secondary | ICD-10-CM | POA: Diagnosis not present

## 2022-06-02 ENCOUNTER — Ambulatory Visit: Payer: Medicare HMO | Admitting: Sports Medicine

## 2022-06-06 ENCOUNTER — Other Ambulatory Visit: Payer: Self-pay | Admitting: Internal Medicine

## 2022-06-06 DIAGNOSIS — E890 Postprocedural hypothyroidism: Secondary | ICD-10-CM

## 2022-06-17 ENCOUNTER — Other Ambulatory Visit: Payer: Self-pay | Admitting: Internal Medicine

## 2022-06-17 DIAGNOSIS — E890 Postprocedural hypothyroidism: Secondary | ICD-10-CM

## 2022-06-22 DIAGNOSIS — M5116 Intervertebral disc disorders with radiculopathy, lumbar region: Secondary | ICD-10-CM | POA: Diagnosis not present

## 2022-06-27 ENCOUNTER — Other Ambulatory Visit: Payer: Self-pay | Admitting: Orthopaedic Surgery

## 2022-06-27 DIAGNOSIS — M5116 Intervertebral disc disorders with radiculopathy, lumbar region: Secondary | ICD-10-CM

## 2022-06-28 ENCOUNTER — Ambulatory Visit: Payer: Medicare HMO | Admitting: Sports Medicine

## 2022-06-30 ENCOUNTER — Ambulatory Visit
Admission: RE | Admit: 2022-06-30 | Discharge: 2022-06-30 | Disposition: A | Discharge status: Home / Self Care | Payer: Medicare HMO | Source: Ambulatory Visit | Attending: Sports Medicine | Admitting: Sports Medicine

## 2022-06-30 ENCOUNTER — Ambulatory Visit (INDEPENDENT_AMBULATORY_CARE_PROVIDER_SITE_OTHER): Payer: Medicare HMO | Admitting: Sports Medicine

## 2022-06-30 VITALS — BP 108/62 | Ht 63.5 in | Wt 140.0 lb

## 2022-06-30 DIAGNOSIS — E890 Postprocedural hypothyroidism: Secondary | ICD-10-CM | POA: Diagnosis not present

## 2022-06-30 DIAGNOSIS — M25511 Pain in right shoulder: Secondary | ICD-10-CM

## 2022-06-30 DIAGNOSIS — M25711 Osteophyte, right shoulder: Secondary | ICD-10-CM | POA: Diagnosis not present

## 2022-06-30 DIAGNOSIS — E892 Postprocedural hypoparathyroidism: Secondary | ICD-10-CM | POA: Diagnosis not present

## 2022-06-30 DIAGNOSIS — S42254D Nondisplaced fracture of greater tuberosity of right humerus, subsequent encounter for fracture with routine healing: Secondary | ICD-10-CM | POA: Diagnosis not present

## 2022-06-30 DIAGNOSIS — M329 Systemic lupus erythematosus, unspecified: Secondary | ICD-10-CM | POA: Diagnosis not present

## 2022-06-30 DIAGNOSIS — R634 Abnormal weight loss: Secondary | ICD-10-CM | POA: Diagnosis not present

## 2022-06-30 NOTE — Progress Notes (Signed)
Terri Roberts - 61 y.o. female MRN [id number]  Date of birth: 10/06/1960    CHIEF COMPLAINT:   Right shoulder pain    SUBJECTIVE:   HPI:  Pleasant 61 year old female comes to clinic for follow-up of her right proximal humerus fracture.  She had a fall on 9/6 and landed on her right shoulder.  She had a subtle nondisplaced fracture of the right proximal humerus greater tuberosity.  She had been treated conservatively for this.  She wore sling for 4 weeks.  She did some home exercises for PT.  Her repeat x-ray on 10/5 showed healing of the fracture with bony callus but there was slightly increased lateral displacement compared to her prior film.  She was referred to Dr. Tamera Punt for a second opinion.  However, due to some financial issues she has been unable to see Dr. Tamera Punt yet.  Today she says it generally is gotten better.  She has improved range of motion.  It has been sore over the lateral aspect for the last couple days.  It does hurt to sleep on it.  She has been taking tramadol which helps a little.  No numbness or tingling down the arm.  ROS:     See HPI  PERTINENT  PMH / PSH FH / / SH:  Past Medical, Surgical, Social, and Family History Reviewed & Updated in the EMR.  Pertinent findings include:  Stage III CKD, cannot take NSAIDs  OBJECTIVE: There were no vitals taken for this visit.  Physical Exam:  Vital signs are reviewed.  GEN: Alert and oriented, NAD Pulm: Breathing unlabored PSY: normal mood, congruent affect  MSK: Right shoulder -no obvious deformity.  Nontender to palpation over the clavicle, AC joint, or biceps tendon in the bicipital groove.  She is mildly tender to palpation over the lateral aspect of the humeral head.  Active range of motion abduction to 160 degrees, forward flexion 160 degrees, external rotation to 60 degrees, internal rotation to lumbar spine.  5/5 strength throughout.  Negative Hawkins.  Negative Neer's.  Negative empty can.  Negative speeds.   She is neurovascular intact distally  ASSESSMENT & PLAN:  1.  Nondisplaced right proximal humerus greater tuberosity fracture, subsequent encounter  -Patient seems to be recovering quite well clinically from her fracture.  Her range of motion and strength are excellent today.  She is about 3 months out now so I imagine most of her healing has already occurred.  Will have her work on some rotator cuff strengthening home exercises and will obtain a final x-ray to make sure there is no major shifts from prior imaging.  She likely will not need to see Dr. Tamera Punt but I will see what our final x-ray looks like.  If it looks good she can just follow-up here as needed.  All questions were answered and she agrees to plan.  Dortha Kern, MD PGY-4, Sports Medicine Fellow [place]   Patient seen and evaluated with the sports medicine fellow.  I agree with the above plan of care.  Physical exam today shows nearly complete recovery of range of motion and she has excellent rotator cuff strength.  We will check a follow-up x-ray and she will follow-up with me again in 4 weeks.  Given her good recovery thus far I think we can wait on the referral to Dr. Tamera Punt.  Addendum: X-ray reviewed.  There is excellent callus formation around the proximal humerus fracture consistent with progressive healing.  Follow-up as scheduled in 4 weeks.

## 2022-07-06 ENCOUNTER — Encounter: Payer: Self-pay | Admitting: Sports Medicine

## 2022-07-06 DIAGNOSIS — D631 Anemia in chronic kidney disease: Secondary | ICD-10-CM | POA: Diagnosis not present

## 2022-07-06 DIAGNOSIS — Z841 Family history of disorders of kidney and ureter: Secondary | ICD-10-CM | POA: Diagnosis not present

## 2022-07-06 DIAGNOSIS — I6789 Other cerebrovascular disease: Secondary | ICD-10-CM | POA: Diagnosis not present

## 2022-07-06 DIAGNOSIS — E890 Postprocedural hypothyroidism: Secondary | ICD-10-CM | POA: Diagnosis not present

## 2022-07-06 DIAGNOSIS — M3500 Sicca syndrome, unspecified: Secondary | ICD-10-CM | POA: Diagnosis not present

## 2022-07-06 DIAGNOSIS — E209 Hypoparathyroidism, unspecified: Secondary | ICD-10-CM | POA: Diagnosis not present

## 2022-07-06 DIAGNOSIS — R630 Anorexia: Secondary | ICD-10-CM | POA: Diagnosis not present

## 2022-07-06 DIAGNOSIS — Z6825 Body mass index (BMI) 25.0-25.9, adult: Secondary | ICD-10-CM | POA: Diagnosis not present

## 2022-07-06 DIAGNOSIS — N183 Chronic kidney disease, stage 3 unspecified: Secondary | ICD-10-CM | POA: Diagnosis not present

## 2022-07-08 DIAGNOSIS — N261 Atrophy of kidney (terminal): Secondary | ICD-10-CM | POA: Diagnosis not present

## 2022-07-08 DIAGNOSIS — N183 Chronic kidney disease, stage 3 unspecified: Secondary | ICD-10-CM | POA: Diagnosis not present

## 2022-07-08 DIAGNOSIS — N200 Calculus of kidney: Secondary | ICD-10-CM | POA: Diagnosis not present

## 2022-07-08 DIAGNOSIS — N281 Cyst of kidney, acquired: Secondary | ICD-10-CM | POA: Diagnosis not present

## 2022-07-17 ENCOUNTER — Ambulatory Visit
Admission: RE | Admit: 2022-07-17 | Discharge: 2022-07-17 | Disposition: A | Discharge status: Home / Self Care | Payer: Medicare HMO | Source: Ambulatory Visit | Attending: Orthopaedic Surgery | Admitting: Orthopaedic Surgery

## 2022-07-17 DIAGNOSIS — M5116 Intervertebral disc disorders with radiculopathy, lumbar region: Secondary | ICD-10-CM

## 2022-07-17 DIAGNOSIS — M545 Low back pain, unspecified: Secondary | ICD-10-CM | POA: Diagnosis not present

## 2022-08-09 DIAGNOSIS — G43719 Chronic migraine without aura, intractable, without status migrainosus: Secondary | ICD-10-CM | POA: Diagnosis not present

## 2022-08-10 DIAGNOSIS — M5116 Intervertebral disc disorders with radiculopathy, lumbar region: Secondary | ICD-10-CM | POA: Diagnosis not present

## 2022-08-24 DIAGNOSIS — Z6821 Body mass index (BMI) 21.0-21.9, adult: Secondary | ICD-10-CM | POA: Diagnosis not present

## 2022-08-24 DIAGNOSIS — M461 Sacroiliitis, not elsewhere classified: Secondary | ICD-10-CM | POA: Diagnosis not present

## 2022-08-30 DIAGNOSIS — E892 Postprocedural hypoparathyroidism: Secondary | ICD-10-CM | POA: Diagnosis not present

## 2022-09-01 ENCOUNTER — Other Ambulatory Visit: Payer: Self-pay | Admitting: Internal Medicine

## 2022-09-01 DIAGNOSIS — Z1231 Encounter for screening mammogram for malignant neoplasm of breast: Secondary | ICD-10-CM

## 2022-09-06 ENCOUNTER — Other Ambulatory Visit: Payer: Self-pay | Admitting: Obstetrics and Gynecology

## 2022-09-06 ENCOUNTER — Other Ambulatory Visit (HOSPITAL_COMMUNITY)
Admission: RE | Admit: 2022-09-06 | Discharge: 2022-09-06 | Disposition: A | Discharge status: Home / Self Care | Payer: Medicare HMO | Source: Ambulatory Visit | Attending: Obstetrics and Gynecology | Admitting: Obstetrics and Gynecology

## 2022-09-06 DIAGNOSIS — Z1151 Encounter for screening for human papillomavirus (HPV): Secondary | ICD-10-CM | POA: Insufficient documentation

## 2022-09-06 DIAGNOSIS — R87810 Cervical high risk human papillomavirus (HPV) DNA test positive: Secondary | ICD-10-CM | POA: Insufficient documentation

## 2022-09-06 DIAGNOSIS — Z9189 Other specified personal risk factors, not elsewhere classified: Secondary | ICD-10-CM | POA: Diagnosis not present

## 2022-09-06 DIAGNOSIS — B977 Papillomavirus as the cause of diseases classified elsewhere: Secondary | ICD-10-CM | POA: Diagnosis not present

## 2022-09-06 DIAGNOSIS — R87610 Atypical squamous cells of undetermined significance on cytologic smear of cervix (ASC-US): Secondary | ICD-10-CM | POA: Diagnosis not present

## 2022-09-06 DIAGNOSIS — Z8742 Personal history of other diseases of the female genital tract: Secondary | ICD-10-CM | POA: Diagnosis not present

## 2022-09-06 DIAGNOSIS — Z01419 Encounter for gynecological examination (general) (routine) without abnormal findings: Secondary | ICD-10-CM | POA: Insufficient documentation

## 2022-09-06 DIAGNOSIS — Z01411 Encounter for gynecological examination (general) (routine) with abnormal findings: Secondary | ICD-10-CM | POA: Diagnosis not present

## 2022-09-13 LAB — CYTOLOGY - PAP
Comment: NEGATIVE
Comment: NEGATIVE
Comment: NEGATIVE
Diagnosis: UNDETERMINED — AB
HPV 16: POSITIVE — AB
HPV 18 / 45: NEGATIVE
High risk HPV: POSITIVE — AB

## 2022-09-16 IMAGING — MR MR LUMBAR SPINE W/O CM
4 of 5 series · 27 of 48 positions shown · non-contrast
Comparison: July 2015

CLINICAL DATA: Osteoarthritis of spine with radiculopathy;
technologist note states right low back pain radiating into right
lateral leg, right hip pain

EXAM:
MRI LUMBAR SPINE WITHOUT CONTRAST
TECHNIQUE: Multiplanar, multisequence MR imaging of the lumbar spine was
performed. No intravenous contrast was administered.

[Series 3: T2 · sagittal · 4.0mm · 1.09mm/px · 6 of 16 slices shown (1 of 2)]
[im 1/16]
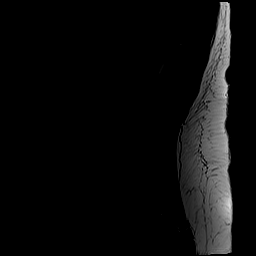
[im 4/16]
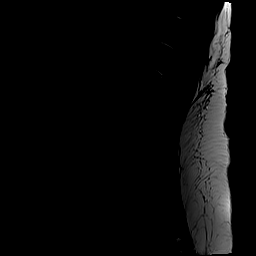
[im 7/16]
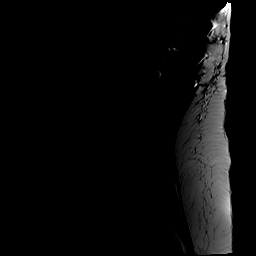
[im 10/16]
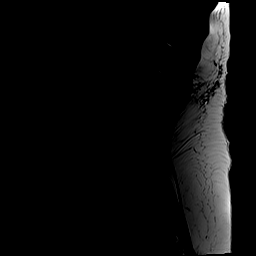
[im 13/16]
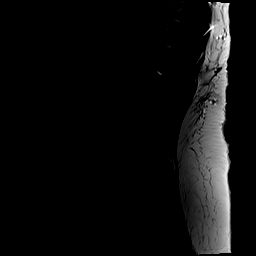
[im 16/16]
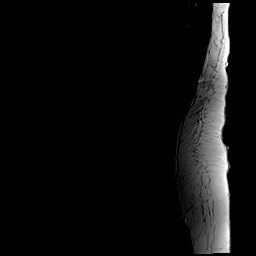

[Series 5: T1 · sagittal · 4.0mm · 1.09mm/px · 6 of 16 slices shown (1 of 2)]
[im 1/16]
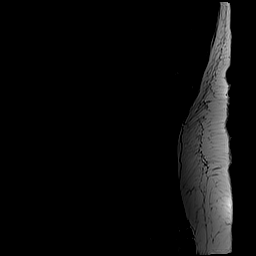
[im 4/16]
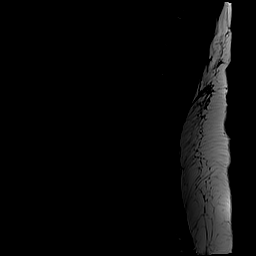
[im 7/16]
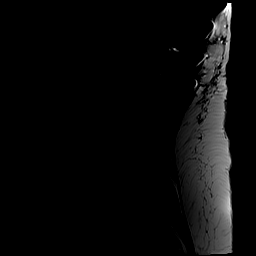
[im 10/16]
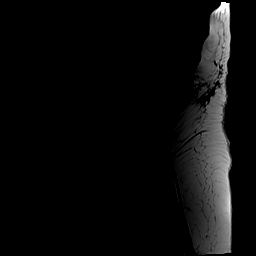
[im 13/16]
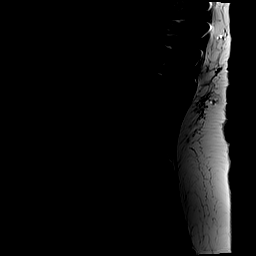
[im 16/16]
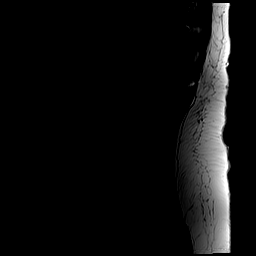

[Series 6: T2 · axial · 4.0mm · 0.39mm/px · z∈[-62,+182]mm · 9 of 42 slices shown (2 of 2)]
[im 1/42]
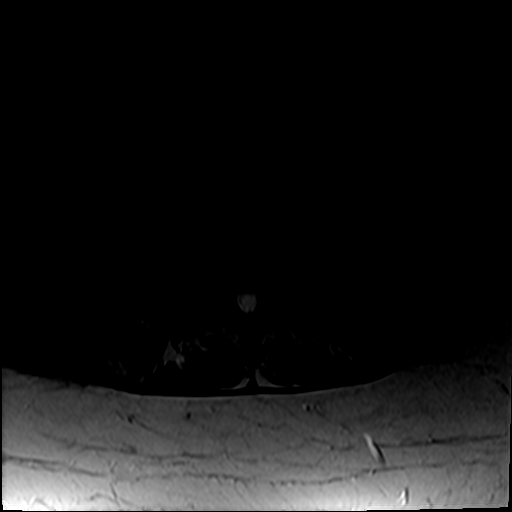
[im 6/42]
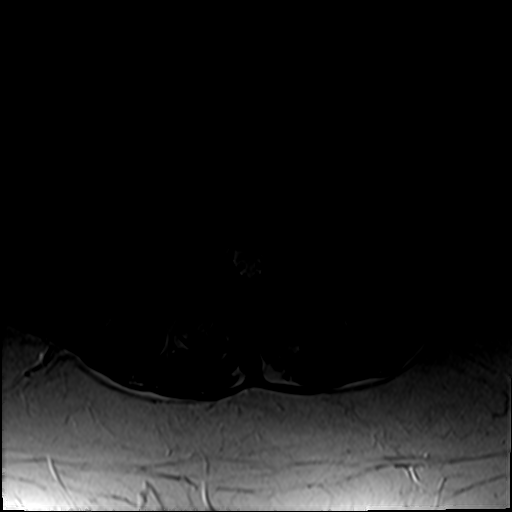
[im 12/42]
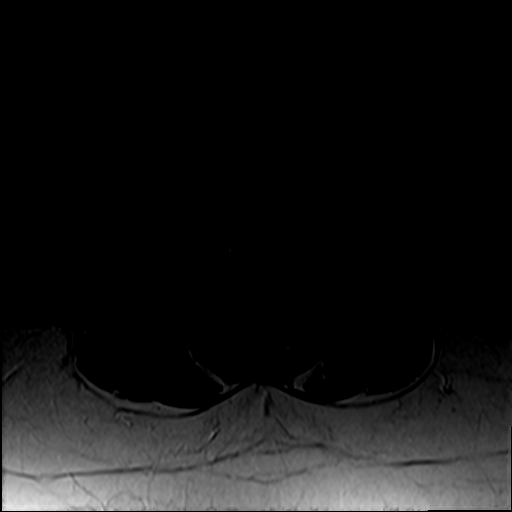
[im 18/42]
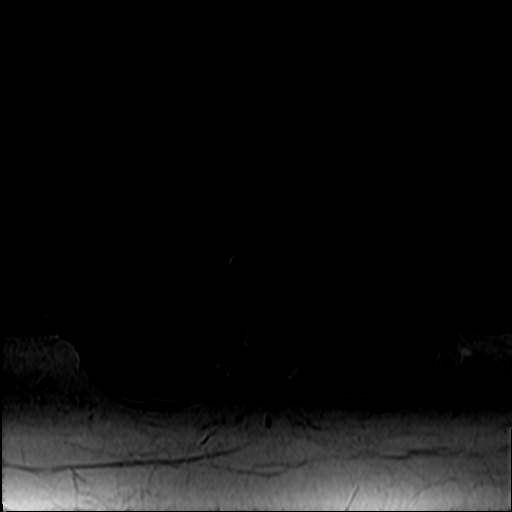
[im 21/42]
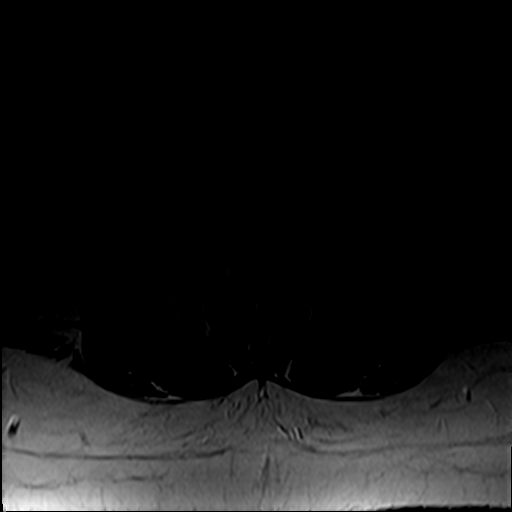
[im 24/42]
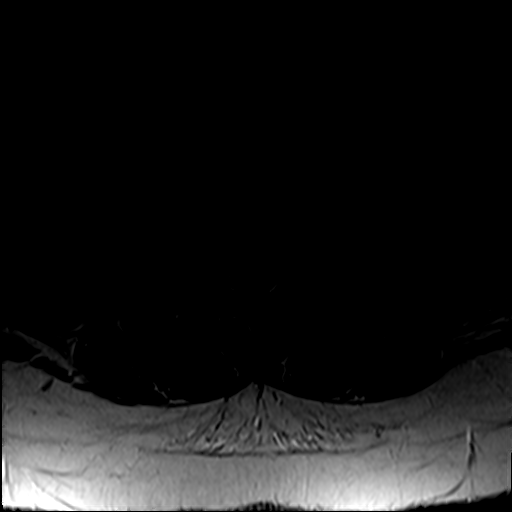
[im 30/42]
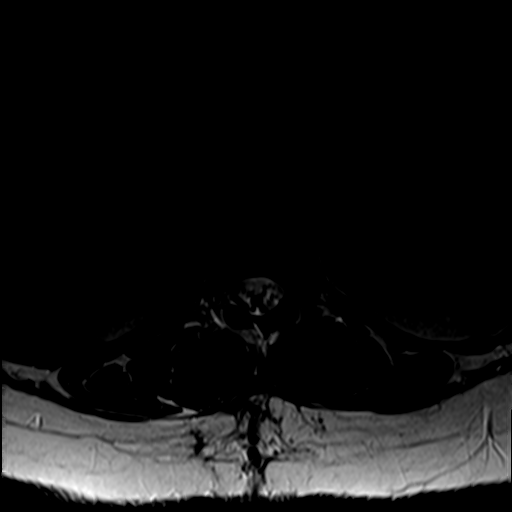
[im 36/42]
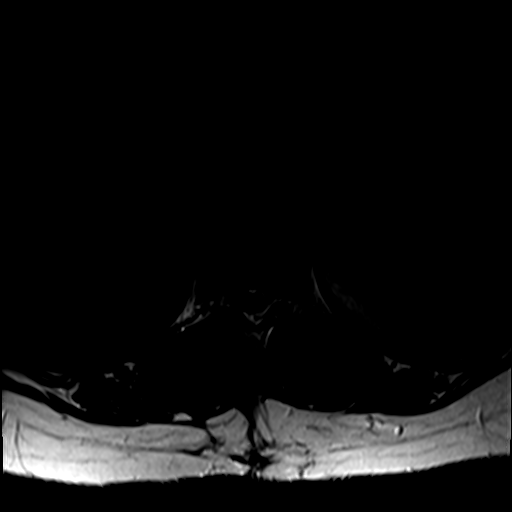
[im 42/42]
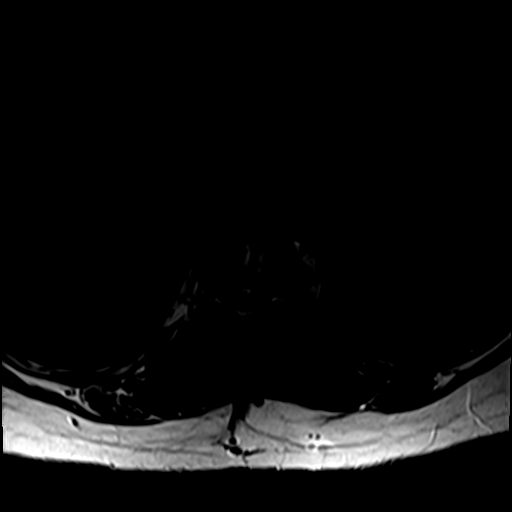

[Series 7: T1 · axial · 4.0mm · 0.39mm/px · z∈[-62,+154]mm · 6 of 42 slices shown (2 of 2)]
[im 1/42]
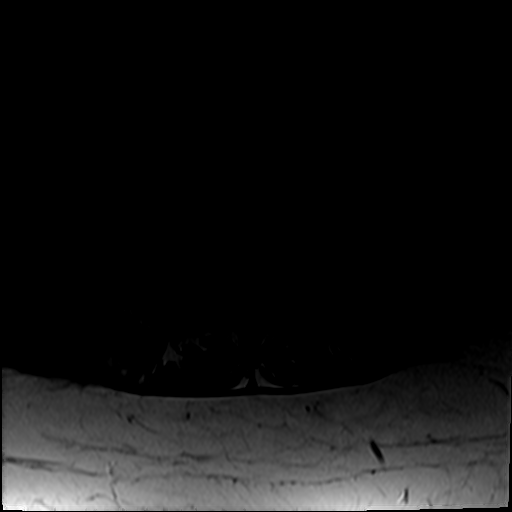
[im 6/42]
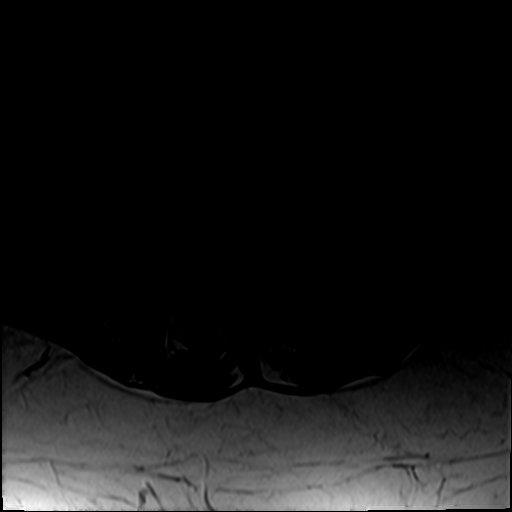
[im 12/42]
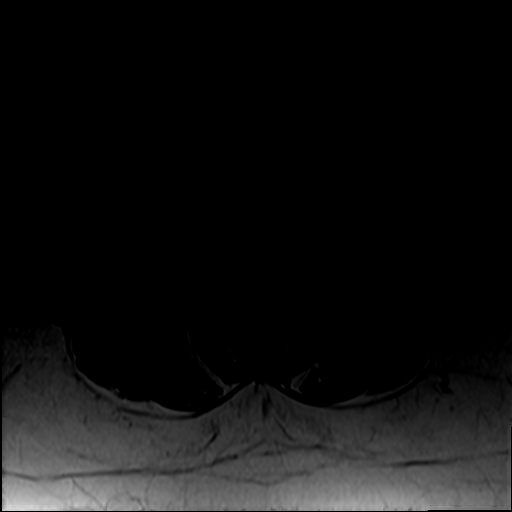
[im 18/42]
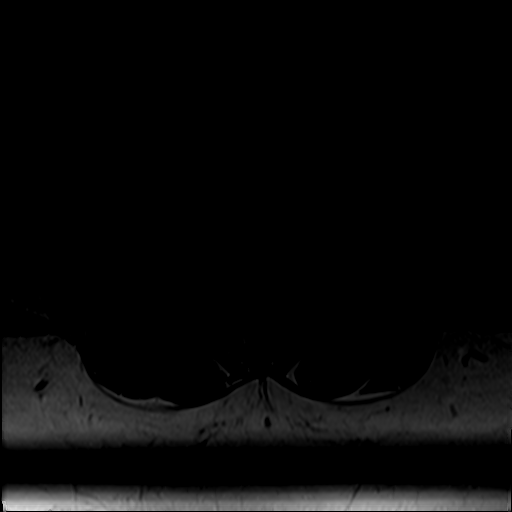
[im 21/42]
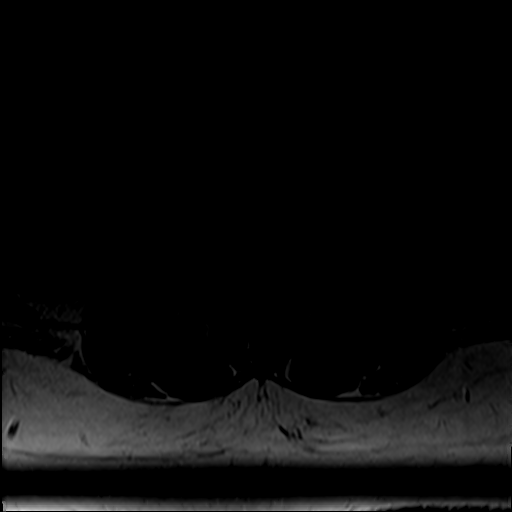
[im 36/42]
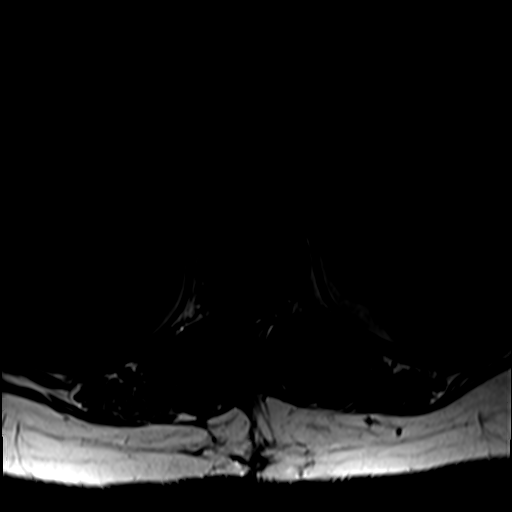

[27 of 48 positions shown; findings below may reference images not displayed]

FINDINGS: Segmentation: Counting from above on prior CT imaging, there are 11
rib-bearing thoracic vertebral bodies and 5 lumbar type vertebral
bodies.

Alignment:  No significant listhesis.

Vertebrae: Interval postoperative changes of posterior fusion
extending superiorly out of field of view from the L1 level. Prior
pedicle screw tracks are present at L2. Hardware is not well
evaluated on this study and there is associated susceptibility
artifact. Chronic T11 compression fracture. Lumbar vertebral body
heights are maintained. No marrow edema. No suspicious osseous
lesion.

Conus medullaris and cauda equina: Conus extends to the L1-L2 level.
Conus and cauda equina appear normal.

Paraspinal and other soft tissues: Chronic postoperative changes.

Disc levels:

L1-L2:  No stenosis.

L2-L3:  No stenosis.

L3-L4:  Disc desiccation.  Minimal disc bulge.  No stenosis.

L4-L5:  Disc desiccation.  Facet arthropathy.  No stenosis.

L5-S1: Disc desiccation. Left greater than right facet arthropathy.
No stenosis.
IMPRESSION: Facet predominant lower lumbar degenerative changes. No high-grade
stenosis.

## 2022-09-22 DIAGNOSIS — G894 Chronic pain syndrome: Secondary | ICD-10-CM | POA: Diagnosis not present

## 2022-09-22 DIAGNOSIS — Z79891 Long term (current) use of opiate analgesic: Secondary | ICD-10-CM | POA: Diagnosis not present

## 2022-09-22 DIAGNOSIS — M7061 Trochanteric bursitis, right hip: Secondary | ICD-10-CM | POA: Diagnosis not present

## 2022-09-22 DIAGNOSIS — Z79899 Other long term (current) drug therapy: Secondary | ICD-10-CM | POA: Diagnosis not present

## 2022-09-22 DIAGNOSIS — M5416 Radiculopathy, lumbar region: Secondary | ICD-10-CM | POA: Diagnosis not present

## 2022-10-04 DIAGNOSIS — D631 Anemia in chronic kidney disease: Secondary | ICD-10-CM | POA: Diagnosis not present

## 2022-10-04 DIAGNOSIS — M65331 Trigger finger, right middle finger: Secondary | ICD-10-CM | POA: Diagnosis not present

## 2022-10-04 DIAGNOSIS — N1832 Chronic kidney disease, stage 3b: Secondary | ICD-10-CM | POA: Diagnosis not present

## 2022-10-04 DIAGNOSIS — M5030 Other cervical disc degeneration, unspecified cervical region: Secondary | ICD-10-CM | POA: Diagnosis not present

## 2022-10-04 DIAGNOSIS — M797 Fibromyalgia: Secondary | ICD-10-CM | POA: Diagnosis not present

## 2022-10-04 DIAGNOSIS — R634 Abnormal weight loss: Secondary | ICD-10-CM | POA: Diagnosis not present

## 2022-10-04 DIAGNOSIS — Z6824 Body mass index (BMI) 24.0-24.9, adult: Secondary | ICD-10-CM | POA: Diagnosis not present

## 2022-10-04 DIAGNOSIS — M3501 Sicca syndrome with keratoconjunctivitis: Secondary | ICD-10-CM | POA: Diagnosis not present

## 2022-10-05 ENCOUNTER — Other Ambulatory Visit: Payer: Self-pay | Admitting: Obstetrics and Gynecology

## 2022-10-05 DIAGNOSIS — R896 Abnormal cytological findings in specimens from other organs, systems and tissues: Secondary | ICD-10-CM | POA: Diagnosis not present

## 2022-10-05 DIAGNOSIS — N888 Other specified noninflammatory disorders of cervix uteri: Secondary | ICD-10-CM | POA: Diagnosis not present

## 2022-10-05 DIAGNOSIS — R87616 Satisfactory cervical smear but lacking transformation zone: Secondary | ICD-10-CM | POA: Diagnosis not present

## 2022-10-05 LAB — LAB REPORT - SCANNED: EGFR: 18

## 2022-10-12 DIAGNOSIS — H524 Presbyopia: Secondary | ICD-10-CM | POA: Diagnosis not present

## 2022-10-19 ENCOUNTER — Ambulatory Visit
Admission: RE | Admit: 2022-10-19 | Discharge: 2022-10-19 | Disposition: A | Discharge status: Home / Self Care | Payer: Medicare HMO | Source: Ambulatory Visit | Attending: Internal Medicine | Admitting: Internal Medicine

## 2022-10-19 DIAGNOSIS — Z1231 Encounter for screening mammogram for malignant neoplasm of breast: Secondary | ICD-10-CM

## 2022-10-26 ENCOUNTER — Encounter: Payer: Self-pay | Admitting: Internal Medicine

## 2022-11-22 DIAGNOSIS — G43019 Migraine without aura, intractable, without status migrainosus: Secondary | ICD-10-CM | POA: Diagnosis not present

## 2022-11-29 DIAGNOSIS — M47816 Spondylosis without myelopathy or radiculopathy, lumbar region: Secondary | ICD-10-CM | POA: Diagnosis not present

## 2022-12-09 ENCOUNTER — Other Ambulatory Visit: Payer: Self-pay | Admitting: Internal Medicine

## 2022-12-18 ENCOUNTER — Encounter: Payer: Self-pay | Admitting: Internal Medicine

## 2022-12-18 NOTE — Progress Notes (Unsigned)
Subjective:    Patient ID: Terri Roberts, female    DOB: 1961-06-17, 62 y.o.   MRN: [id number]      HPI Hera is here for a Physical exam and her chronic medical problems.   Concerned about anemia - she feels cold all the time   N/V - saw GI - had normal EGD, colonoscopy.  Advised not to take iron   Has developed allergies - coughing, sneezing and phlegm.  Has had it for 6 months to a year.  Taking allergy tabs and flonase - not helping much.  Phlegm is sometimes brown. Does not feel it is an infection.   Medications and allergies reviewed with patient and updated if appropriate.  Current Outpatient Medications on File Prior to Visit  Medication Sig Dispense Refill   baclofen (LIORESAL) 10 MG tablet Take 10 mg by mouth as needed for muscle spasms.     calcitRIOL (ROCALTROL) 0.5 MCG capsule TAKE TWICE DAILY 60 capsule 0   cycloSPORINE (RESTASIS) 0.05 % ophthalmic emulsion Place 1 drop into both eyes 2 (two) times daily.     EMGALITY 120 MG/ML SOAJ Inject into the skin.     fluticasone (FLONASE) 50 MCG/ACT nasal spray Place 2 sprays into both nostrils daily. 16 g 0   gabapentin (NEURONTIN) 300 MG capsule TAKE 2 CAPSULES (600 MG TOTAL) BY MOUTH 2 (TWO) TIMES DAILY. 90 capsule 0   hydroxychloroquine (PLAQUENIL) 200 MG tablet Take 400 mg by mouth daily.      levothyroxine (SYNTHROID) 125 MCG tablet TAKE 1 TABLET BY MOUTH EVERY DAY 90 tablet 0   linaclotide (LINZESS) 72 MCG capsule Take 1 capsule (72 mcg total) by mouth daily before breakfast. 30 capsule 5   omeprazole (PRILOSEC) 40 MG capsule Take 1 capsule (40 mg total) by mouth daily. Annual appt is due  must see provider for future refills 30 capsule 0   ondansetron (ZOFRAN) 4 MG tablet TAKE 1 TABLET BY MOUTH EVERY 8 HOURS AS NEEDED FOR NAUSEA AND VOMITING 30 tablet 5   pilocarpine (SALAGEN) 5 MG tablet TAKE 1 TABLET PO TWICE A DAY  3   tiZANidine (ZANAFLEX) 4 MG tablet Take 1 tablet (4 mg total) by mouth every 6 (six) hours as  needed for muscle spasms. 30 tablet 6   topiramate (TOPAMAX) 200 MG tablet Take 200 mg by mouth at bedtime.      traMADol (ULTRAM) 50 MG tablet tramadol 50 mg tablet   100 mg by oral route.     zolmitriptan (ZOMIG) 5 MG nasal solution PLEASE SEE ATTACHED FOR DETAILED DIRECTIONS     No current facility-administered medications on file prior to visit.    Review of Systems  Constitutional:  Negative for fever.  HENT:  Positive for postnasal drip, rhinorrhea and sneezing. Negative for congestion, ear pain and sore throat.   Eyes:  Positive for discharge (runny). Negative for visual disturbance.  Respiratory:  Positive for cough (productive of brown mucus at times) and wheezing. Negative for shortness of breath.   Cardiovascular:  Positive for palpitations. Negative for chest pain and leg swelling.  Gastrointestinal:  Positive for constipation (linzess prn) and nausea. Negative for abdominal pain, blood in stool and diarrhea.       Gerd occ  Genitourinary:  Negative for dysuria.  Musculoskeletal:  Positive for arthralgias, back pain and myalgias.  Skin:  Negative for rash.  Neurological:  Positive for headaches (migraines). Negative for light-headedness.  Psychiatric/Behavioral:  Positive for dysphoric mood (some  situational). The patient is not nervous/anxious.        Objective:   Vitals:   12/19/22 1524  BP: 116/80  Pulse: 65  Temp: 98.6 F (37 C)  SpO2: 98%   Filed Weights   12/19/22 1524  Weight: 144 lb (65.3 kg)   Body mass index is 25.11 kg/m.  BP Readings from Last 3 Encounters:  12/19/22 116/80  06/30/22 108/62  05/28/22 (!) 149/67    Wt Readings from Last 3 Encounters:  12/19/22 144 lb (65.3 kg)  06/30/22 140 lb (63.5 kg)  04/12/22 146 lb (66.2 kg)       Physical Exam Constitutional: She appears well-developed and well-nourished. No distress.  HENT:  Head: Normocephalic and atraumatic.  Right Ear: External ear normal. Normal ear canal and TM Left Ear:  External ear normal.  Normal ear canal and TM Mouth/Throat: Oropharynx is clear and moist.  Eyes: Conjunctivae normal.  Neck: Neck supple. No tracheal deviation present. No thyromegaly present.  No carotid bruit  Cardiovascular: Normal rate, regular rhythm and normal heart sounds.   No murmur heard.  No edema. Pulmonary/Chest: Effort normal and breath sounds normal. No respiratory distress. She has no wheezes. She has no rales.  Breast: deferred   Abdominal: Soft. She exhibits no distension. There is no tenderness.  Lymphadenopathy: She has no cervical adenopathy.  Skin: Skin is warm and dry. She is not diaphoretic.  Psychiatric: She has a normal mood and affect. Her behavior is normal.     Lab Results  Component Value Date   WBC 6.5 03/11/2022   HGB 9.5 (L) 03/11/2022   HCT 27.8 (L) 03/11/2022   PLT 255.0 03/11/2022   GLUCOSE 92 03/11/2022   CHOL 189 04/23/2021   TRIG 101.0 04/23/2021   HDL 43.60 04/23/2021   LDLCALC 125 (H) 04/23/2021   ALT 8 03/11/2022   AST 17 03/11/2022   NA 140 03/11/2022   K 3.9 03/11/2022   CL 105 03/11/2022   CREATININE 2.83 (H) 03/11/2022   BUN 29 (H) 03/11/2022   CO2 27 03/11/2022   TSH 16.78 (H) 03/11/2022   HGBA1C 5.3 01/12/2016         Assessment & Plan:   Physical exam: Screening blood work  ordered Exercise  - leg exercises - to build muscle Weight  good Substance abuse  none   Reviewed recommended immunizations.   Health Maintenance  Topic Date Due   Zoster Vaccines- Shingrix (1 of 2) Never done   COVID-19 Vaccine (4 - 2023-24 season) 04/01/2022   DTaP/Tdap/Td (2 - Td or Tdap) 11/02/2022   Medicare Annual Wellness (AWV)  01/07/2023   INFLUENZA VACCINE  03/02/2023   MAMMOGRAM  10/18/2024   PAP SMEAR-Modifier  09/06/2025   COLONOSCOPY (Pts 45-62yrs Insurance coverage will need to be confirmed)  11/16/2031   Hepatitis C Screening  Completed   HIV Screening  Completed   HPV VACCINES  Aged Out          See Problem  List for Assessment and Plan of chronic medical problems.

## 2022-12-18 NOTE — Patient Instructions (Signed)
Blood work was ordered.   The lab is on the first floor.    Medications changes include :   none     Return in about 1 year (around 12/19/2023) for Physical Exam.   Health Maintenance, Female Adopting a healthy lifestyle and getting preventive care are important in promoting health and wellness. Ask your health care provider about: The right schedule for you to have regular tests and exams. Things you can do on your own to prevent diseases and keep yourself healthy. What should I know about diet, weight, and exercise? Eat a healthy diet  Eat a diet that includes plenty of vegetables, fruits, low-fat dairy products, and lean protein. Do not eat a lot of foods that are high in solid fats, added sugars, or sodium. Maintain a healthy weight Body mass index (BMI) is used to identify weight problems. It estimates body fat based on height and weight. Your health care provider can help determine your BMI and help you achieve or maintain a healthy weight. Get regular exercise Get regular exercise. This is one of the most important things you can do for your health. Most adults should: Exercise for at least 150 minutes each week. The exercise should increase your heart rate and make you sweat (moderate-intensity exercise). Do strengthening exercises at least twice a week. This is in addition to the moderate-intensity exercise. Spend less time sitting. Even light physical activity can be beneficial. Watch cholesterol and blood lipids Have your blood tested for lipids and cholesterol at 61 years of age, then have this test every 5 years. Have your cholesterol levels checked more often if: Your lipid or cholesterol levels are high. You are older than 61 years of age. You are at high risk for heart disease. What should I know about cancer screening? Depending on your health history and family history, you may need to have cancer screening at various ages. This may include screening  for: Breast cancer. Cervical cancer. Colorectal cancer. Skin cancer. Lung cancer. What should I know about heart disease, diabetes, and high blood pressure? Blood pressure and heart disease High blood pressure causes heart disease and increases the risk of stroke. This is more likely to develop in people who have high blood pressure readings or are overweight. Have your blood pressure checked: Every 3-5 years if you are 87-70 years of age. Every year if you are 45 years old or older. Diabetes Have regular diabetes screenings. This checks your fasting blood sugar level. Have the screening done: Once every three years after age 56 if you are at a normal weight and have a low risk for diabetes. More often and at a younger age if you are overweight or have a high risk for diabetes. What should I know about preventing infection? Hepatitis B If you have a higher risk for hepatitis B, you should be screened for this virus. Talk with your health care provider to find out if you are at risk for hepatitis B infection. Hepatitis C Testing is recommended for: Everyone born from [birth year] through 1964. Anyone with known risk factors for hepatitis C. Sexually transmitted infections (STIs) Get screened for STIs, including gonorrhea and chlamydia, if: You are sexually active and are younger than 61 years of age. You are older than 61 years of age and your health care provider tells you that you are at risk for this type of infection. Your sexual activity has changed since you were last screened, and you are at  increased risk for chlamydia or gonorrhea. Ask your health care provider if you are at risk. Ask your health care provider about whether you are at high risk for HIV. Your health care provider may recommend a prescription medicine to help prevent HIV infection. If you choose to take medicine to prevent HIV, you should first get tested for HIV. You should then be tested every 3 months for as long as you  are taking the medicine. Pregnancy If you are about to stop having your period (premenopausal) and you may become pregnant, seek counseling before you get pregnant. Take 400 to 800 micrograms (mcg) of folic acid every day if you become pregnant. Ask for birth control (contraception) if you want to prevent pregnancy. Osteoporosis and menopause Osteoporosis is a disease in which the bones lose minerals and strength with aging. This can result in bone fractures. If you are 67 years old or older, or if you are at risk for osteoporosis and fractures, ask your health care provider if you should: Be screened for bone loss. Take a calcium or vitamin D supplement to lower your risk of fractures. Be given hormone replacement therapy (HRT) to treat symptoms of menopause. Follow these instructions at home: Alcohol use Do not drink alcohol if: Your health care provider tells you not to drink. You are pregnant, may be pregnant, or are planning to become pregnant. If you drink alcohol: Limit how much you have to: 0-1 drink a day. Know how much alcohol is in your drink. In the [place] one drink equals one 12 oz bottle of beer (355 mL), one 5 oz glass of wine (148 mL), or one 1 oz glass of hard liquor (44 mL). Lifestyle Do not use any products that contain nicotine or tobacco. These products include cigarettes, chewing tobacco, and vaping devices, such as e-cigarettes. If you need help quitting, ask your health care provider. Do not use street drugs. Do not share needles. Ask your health care provider for help if you need support or information about quitting drugs. General instructions Schedule regular health, dental, and eye exams. Stay current with your vaccines. Tell your health care provider if: You often feel depressed. You have ever been abused or do not feel safe at home. Summary Adopting a healthy lifestyle and getting preventive care are important in promoting health and wellness. Follow your  health care provider's instructions about healthy diet, exercising, and getting tested or screened for diseases. Follow your health care provider's instructions on monitoring your cholesterol and blood pressure. This information is not intended to replace advice given to you by your health care provider. Make sure you discuss any questions you have with your health care provider. Document Revised: 12/07/2020 Document Reviewed: 12/07/2020 Elsevier Patient Education  2023 [employer].

## 2022-12-19 ENCOUNTER — Ambulatory Visit (INDEPENDENT_AMBULATORY_CARE_PROVIDER_SITE_OTHER): Payer: Medicare HMO | Admitting: Internal Medicine

## 2022-12-19 VITALS — BP 116/80 | HR 65 | Temp 98.6°F | Ht 63.5 in | Wt 144.0 lb

## 2022-12-19 DIAGNOSIS — I773 Arterial fibromuscular dysplasia: Secondary | ICD-10-CM | POA: Diagnosis not present

## 2022-12-19 DIAGNOSIS — M3500 Sicca syndrome, unspecified: Secondary | ICD-10-CM | POA: Diagnosis not present

## 2022-12-19 DIAGNOSIS — E890 Postprocedural hypothyroidism: Secondary | ICD-10-CM

## 2022-12-19 DIAGNOSIS — K219 Gastro-esophageal reflux disease without esophagitis: Secondary | ICD-10-CM

## 2022-12-19 DIAGNOSIS — N184 Chronic kidney disease, stage 4 (severe): Secondary | ICD-10-CM

## 2022-12-19 DIAGNOSIS — D649 Anemia, unspecified: Secondary | ICD-10-CM

## 2022-12-19 DIAGNOSIS — Z0001 Encounter for general adult medical examination with abnormal findings: Secondary | ICD-10-CM

## 2022-12-19 DIAGNOSIS — I6522 Occlusion and stenosis of left carotid artery: Secondary | ICD-10-CM

## 2022-12-19 DIAGNOSIS — G43809 Other migraine, not intractable, without status migrainosus: Secondary | ICD-10-CM

## 2022-12-19 DIAGNOSIS — R052 Subacute cough: Secondary | ICD-10-CM

## 2022-12-19 DIAGNOSIS — Z0000 Encounter for general adult medical examination without abnormal findings: Secondary | ICD-10-CM

## 2022-12-19 DIAGNOSIS — K5909 Other constipation: Secondary | ICD-10-CM | POA: Diagnosis not present

## 2022-12-19 DIAGNOSIS — R059 Cough, unspecified: Secondary | ICD-10-CM | POA: Insufficient documentation

## 2022-12-19 LAB — CBC WITH DIFFERENTIAL/PLATELET
Basophils Absolute: 0.1 10*3/uL (ref 0.0–0.1)
Basophils Relative: 0.8 % (ref 0.0–3.0)
Eosinophils Absolute: 0.1 10*3/uL (ref 0.0–0.7)
Eosinophils Relative: 1.4 % (ref 0.0–5.0)
HCT: 27.5 % — ABNORMAL LOW (ref 36.0–46.0)
Hemoglobin: 9.5 g/dL — ABNORMAL LOW (ref 12.0–15.0)
Lymphocytes Relative: 57 % — ABNORMAL HIGH (ref 12.0–46.0)
Lymphs Abs: 3.8 10*3/uL (ref 0.7–4.0)
MCHC: 34.7 g/dL (ref 30.0–36.0)
MCV: 96 fl (ref 78.0–100.0)
Monocytes Absolute: 0.5 10*3/uL (ref 0.1–1.0)
Monocytes Relative: 8.3 % (ref 3.0–12.0)
Neutro Abs: 2.2 10*3/uL (ref 1.4–7.7)
Neutrophils Relative %: 32.5 % — ABNORMAL LOW (ref 43.0–77.0)
Platelets: 221 10*3/uL (ref 150.0–400.0)
RBC: 2.87 Mil/uL — ABNORMAL LOW (ref 3.87–5.11)
RDW: 12.3 % (ref 11.5–15.5)
WBC: 6.6 10*3/uL (ref 4.0–10.5)

## 2022-12-19 LAB — COMPREHENSIVE METABOLIC PANEL
ALT: 14 U/L (ref 0–35)
AST: 23 U/L (ref 0–37)
Albumin: 4.1 g/dL (ref 3.5–5.2)
Alkaline Phosphatase: 42 U/L (ref 39–117)
BUN: 21 mg/dL (ref 6–23)
CO2: 25 mEq/L (ref 19–32)
Calcium: 10.1 mg/dL (ref 8.4–10.5)
Chloride: 109 mEq/L (ref 96–112)
Creatinine, Ser: 3.07 mg/dL — ABNORMAL HIGH (ref 0.40–1.20)
GFR: 15.87 mL/min — ABNORMAL LOW (ref >60.00)
Glucose, Bld: 86 mg/dL (ref 70–99)
Potassium: 3.9 mEq/L (ref 3.5–5.1)
Sodium: 141 mEq/L (ref 135–145)
Total Bilirubin: 0.3 mg/dL (ref 0.2–1.2)
Total Protein: 6.9 g/dL (ref 6.0–8.3)

## 2022-12-19 LAB — LIPID PANEL
Cholesterol: 178 mg/dL (ref 0–200)
HDL: 41 mg/dL (ref >39.00)
LDL Cholesterol: 108 mg/dL — ABNORMAL HIGH (ref 0–99)
NonHDL: 136.74
Total CHOL/HDL Ratio: 4
Triglycerides: 145 mg/dL (ref 0.0–149.0)
VLDL: 29 mg/dL (ref 0.0–40.0)

## 2022-12-19 LAB — IBC PANEL
Iron: 75 ug/dL (ref 42–145)
Saturation Ratios: 31.3 % (ref 20.0–50.0)
TIBC: 239.4 ug/dL — ABNORMAL LOW (ref 250.0–450.0)
Transferrin: 171 mg/dL — ABNORMAL LOW (ref 212.0–360.0)

## 2022-12-19 NOTE — Assessment & Plan Note (Signed)
Chronic Following with nephrology at [facility] Cmp, cbc today Has f/u with nephro next month

## 2022-12-19 NOTE — Assessment & Plan Note (Signed)
Chronic Will recheck vascular US of carotids

## 2022-12-19 NOTE — Assessment & Plan Note (Signed)
Chronic Continue levothyroxine 125 mg daily Will check tsh and adjust medication

## 2022-12-19 NOTE — Assessment & Plan Note (Signed)
Chronic She feels fatigued and cold all the time Will check iron levels - last fall - normal Likely related to CKD  - nephrology will likely consider Epo

## 2022-12-19 NOTE — Assessment & Plan Note (Signed)
Chronic GERD controlled continue omeprazole to 40  mg daily

## 2022-12-19 NOTE — Assessment & Plan Note (Signed)
Subacute Cough with phlegm, sneezing  ? Allergies or residual URI No need to an abx at this time Discussed symptomatic treatment

## 2022-12-19 NOTE — Assessment & Plan Note (Signed)
Chronic Just had colonoscopy and it was normal Continue linzess 72 mcg daily prn

## 2022-12-19 NOTE — Assessment & Plan Note (Signed)
Following with Dr. Beekman 

## 2022-12-19 NOTE — Assessment & Plan Note (Addendum)
Following with neurology On emgality, topamax, zomig controlled

## 2022-12-20 LAB — VITAMIN D 25 HYDROXY (VIT D DEFICIENCY, FRACTURES): VITD: 60.04 ng/mL (ref 30.00–100.00)

## 2022-12-20 LAB — FERRITIN: Ferritin: 128.6 ng/mL (ref 10.0–291.0)

## 2022-12-20 LAB — TSH: TSH: 3.35 u[IU]/mL (ref 0.35–5.50)

## 2022-12-21 ENCOUNTER — Ambulatory Visit (HOSPITAL_COMMUNITY)
Admission: RE | Admit: 2022-12-21 | Discharge: 2022-12-21 | Disposition: A | Discharge status: Home / Self Care | Payer: Medicare HMO | Source: Ambulatory Visit | Attending: Cardiovascular Disease | Admitting: Cardiovascular Disease

## 2022-12-21 DIAGNOSIS — I6522 Occlusion and stenosis of left carotid artery: Secondary | ICD-10-CM | POA: Insufficient documentation

## 2022-12-21 DIAGNOSIS — I773 Arterial fibromuscular dysplasia: Secondary | ICD-10-CM | POA: Insufficient documentation

## 2023-01-04 DIAGNOSIS — I6529 Occlusion and stenosis of unspecified carotid artery: Secondary | ICD-10-CM | POA: Insufficient documentation

## 2023-01-04 NOTE — Addendum Note (Signed)
Addended by: Pincus Sanes on: 01/04/2023 08:16 PM   Modules accepted: Orders

## 2023-01-05 DIAGNOSIS — E7849 Other hyperlipidemia: Secondary | ICD-10-CM

## 2023-01-05 DIAGNOSIS — N184 Chronic kidney disease, stage 4 (severe): Secondary | ICD-10-CM | POA: Diagnosis not present

## 2023-01-07 ENCOUNTER — Other Ambulatory Visit: Payer: Self-pay | Admitting: Internal Medicine

## 2023-01-10 ENCOUNTER — Ambulatory Visit (HOSPITAL_COMMUNITY)
Admission: RE | Admit: 2023-01-10 | Discharge: 2023-01-10 | Disposition: A | Discharge status: Home / Self Care | Payer: Medicare HMO | Source: Ambulatory Visit | Attending: Internal Medicine | Admitting: Internal Medicine

## 2023-01-10 DIAGNOSIS — N184 Chronic kidney disease, stage 4 (severe): Secondary | ICD-10-CM

## 2023-01-12 MED ORDER — ROSUVASTATIN CALCIUM 5 MG PO TABS: 5.0000 mg | ORAL_TABLET | Freq: Every day | ORAL | 5 refills | Status: DC

## 2023-01-17 ENCOUNTER — Ambulatory Visit (INDEPENDENT_AMBULATORY_CARE_PROVIDER_SITE_OTHER): Payer: Medicare HMO

## 2023-01-17 VITALS — Ht 63.5 in | Wt 140.0 lb

## 2023-01-17 DIAGNOSIS — Z0000 Encounter for general adult medical examination without abnormal findings: Secondary | ICD-10-CM

## 2023-01-17 NOTE — Progress Notes (Signed)
Subjective:   Terri Roberts is a 62 y.o. female who presents for Medicare Annual (Subsequent) preventive examination.  Visit Complete: Virtual  I connected with  Dion Saucier on 01/17/23 by a audio enabled telemedicine application and verified that I am speaking with the correct person using two identifiers.  Patient Location: Home  Provider Location: Home Office  I discussed the limitations of evaluation and management by telemedicine. The patient expressed understanding and agreed to proceed.  Patient Medicare AWV questionnaire was completed by the patient on ; I have confirmed that all information answered by patient is correct and no changes since this date.  Review of Systems     Cardiac Risk Factors include: advanced age (>14men, >2 women);Other (see comment), Risk factor comments: Dx: Cortid Artery Stenosis     Objective:    Today's Vitals   01/17/23 1506  Weight: 140 lb (63.5 kg)  Height: 5' 3.5" (1.613 m)   Body mass index is 24.41 kg/m.     01/17/2023    3:18 PM 05/28/2022    8:13 AM 01/06/2022    4:25 PM 10/25/2016   12:00 AM 10/24/2016    9:23 PM 04/24/2016    8:56 PM  Advanced Directives  Does Patient Have a Medical Advance Directive? No No No No No No  Would patient like information on creating a medical advance directive? No - Patient declined No - Patient declined No - Patient declined No - Patient declined  No - patient declined information    Current Medications (verified) Outpatient Encounter Medications as of 01/17/2023  Medication Sig   rosuvastatin (CRESTOR) 5 MG tablet Take 1 tablet (5 mg total) by mouth daily.   baclofen (LIORESAL) 10 MG tablet Take 10 mg by mouth as needed for muscle spasms.   calcitRIOL (ROCALTROL) 0.5 MCG capsule TAKE TWICE DAILY   cycloSPORINE (RESTASIS) 0.05 % ophthalmic emulsion Place 1 drop into both eyes 2 (two) times daily.   EMGALITY 120 MG/ML SOAJ Inject into the skin.   fluticasone (FLONASE) 50 MCG/ACT nasal  spray Place 2 sprays into both nostrils daily.   gabapentin (NEURONTIN) 300 MG capsule TAKE 2 CAPSULES (600 MG TOTAL) BY MOUTH 2 (TWO) TIMES DAILY.   hydroxychloroquine (PLAQUENIL) 200 MG tablet Take 400 mg by mouth daily.    levothyroxine (SYNTHROID) 125 MCG tablet TAKE 1 TABLET BY MOUTH EVERY DAY   linaclotide (LINZESS) 72 MCG capsule Take 1 capsule (72 mcg total) by mouth daily before breakfast.   omeprazole (PRILOSEC) 40 MG capsule TAKE 1 CAPSULE (40 MG TOTAL) BY MOUTH DAILY. ANNUAL APPT IS DUE MUST SEE PROVIDER FOR FUTURE REFILLS   ondansetron (ZOFRAN) 4 MG tablet TAKE 1 TABLET BY MOUTH EVERY 8 HOURS AS NEEDED FOR NAUSEA AND VOMITING   pilocarpine (SALAGEN) 5 MG tablet TAKE 1 TABLET PO TWICE A DAY   topiramate (TOPAMAX) 200 MG tablet Take 200 mg by mouth at bedtime.    traMADol (ULTRAM) 50 MG tablet tramadol 50 mg tablet   100 mg by oral route.   zolmitriptan (ZOMIG) 5 MG nasal solution PLEASE SEE ATTACHED FOR DETAILED DIRECTIONS   No facility-administered encounter medications on file as of 01/17/2023.    Allergies (verified) Nsaids   History: Past Medical History:  Diagnosis Date   Fibromyalgia    Migraine    Dr Neale Burly   Peripheral neuropathy    Sjogren's syndrome (HCC)    Dr Dareen Piano   Thyroid nodule    Past Surgical History:  Procedure Laterality  Date   BACK SURGERY     Intrauterine Ablation  08/01/2009   Dr Stefano Gaul ( now seeing Dr Gerald Leitz)   lip biopsy  08/01/2012   Dr Ina Homes   MANDIBLE SURGERY     TMJ    THYROIDECTOMY  06/01/2009   benign nodule   TUBAL LIGATION     Family History  Problem Relation Age of Onset   Stroke Mother 67   Diabetes Mother    Kidney failure Mother    AAA (abdominal aortic aneurysm) Father    Stroke Sister 80   Hypertension Sister    Diabetes Sister    Hypertension Sister    Heart attack Maternal Grandmother        ? age   Heart attack Maternal Grandfather        ? age   Diabetes Brother    Kidney failure Brother     Cancer Neg Hx    Breast cancer Neg Hx    Social History   Socioeconomic History   Marital status: Widowed    Spouse name: Not on file   Number of children: 2   Years of education: 14   Highest education level: Not on file  Occupational History   Occupation: Product Compliance Specialist  Tobacco Use   Smoking status: Never   Smokeless tobacco: Never  Vaping Use   Vaping Use: Never used  Substance and Sexual Activity   Alcohol use: Not Currently    Comment: rarely   Drug use: Yes    Frequency: 2.0 times per week    Types: Marijuana    Comment: weekly   Sexual activity: Not on file  Other Topics Concern   Not on file  Social History Narrative   Lives at home with her daughter.   Right-handed.   One cup caffeine per day.   Social Determinants of Health   Financial Resource Strain: Low Risk  (01/17/2023)   Overall Financial Resource Strain (CARDIA)    Difficulty of Paying Living Expenses: Not hard at all  Food Insecurity: No Food Insecurity (01/17/2023)   Hunger Vital Sign    Worried About Running Out of Food in the Last Year: Never true    Ran Out of Food in the Last Year: Never true  Transportation Needs: No Transportation Needs (01/17/2023)   PRAPARE - Administrator, Civil Service (Medical): No    Lack of Transportation (Non-Medical): No  Physical Activity: Insufficiently Active (01/17/2023)   Exercise Vital Sign    Days of Exercise per Week: 3 days    Minutes of Exercise per Session: 20 min  Stress: No Stress Concern Present (01/17/2023)   [employer] of Occupational Health - Occupational Stress Questionnaire    Feeling of Stress : Not at all  Social Connections: Moderately Integrated (01/17/2023)   Social Connection and Isolation Panel [NHANES]    Frequency of Communication with Friends and Family: More than three times a week    Frequency of Social Gatherings with Friends and Family: More than three times a week    Attends Religious Services:  More than 4 times per year    Active Member of [employer] or Organizations: Yes    Attends Banker Meetings: More than 4 times per year    Marital Status: Widowed    Tobacco Counseling Counseling given: Not Answered   Clinical Intake:  Pre-visit preparation completed: No  Pain : No/denies pain     BMI - recorded: 24.41 Nutritional Status:  BMI of 19-24  Normal Nutritional Risks: None Diabetes: No  How often do you need to have someone help you when you read instructions, pamphlets, or other written materials from your doctor or pharmacy?: 1 - Never  Interpreter Needed?: No  Information entered by :: Theresa Mulligan LPN   Activities of Daily Living    01/17/2023    3:15 PM  In your present state of health, do you have any difficulty performing the following activities:  Hearing? 0  Vision? 0  Difficulty concentrating or making decisions? 0  Walking or climbing stairs? 0  Dressing or bathing? 0  Doing errands, shopping? 0  Preparing Food and eating ? N  Using the Toilet? N  In the past six months, have you accidently leaked urine? N  Do you have problems with loss of bowel control? N  Managing your Medications? N  Managing your Finances? N  Housekeeping or managing your Housekeeping? N    Patient Care Team: Pincus Sanes, MD as PCP - General (Internal Medicine) Rossie Muskrat, MD (Rheumatology) Tanya Nones, OD as Consulting Physician (Optometry)  Indicate any recent Medical Services you may have received from other than [facility] providers in the past year (date may be approximate).     Assessment:   This is a routine wellness examination for Meghin.  Hearing/Vision screen Hearing Screening - Comments:: Denies hearing difficulties   Vision Screening - Comments:: Wears rx glasses - up to date with routine eye exams with  Dr Clearance Coots  Dietary issues and exercise activities discussed:     Goals Addressed               This Visit's Progress      Patient stated (pt-stated)        I want to stay off dialysis.       Depression Screen    01/17/2023    3:13 PM 12/19/2022    3:29 PM 03/11/2022    9:04 AM 01/06/2022    4:29 PM 06/07/2017    1:57 PM  PHQ 2/9 Scores  PHQ - 2 Score 0 0 2 2 0  PHQ- 9 Score   12 7     Fall Risk    01/17/2023    3:16 PM 12/19/2022    3:29 PM 03/11/2022    9:04 AM 01/06/2022    4:26 PM 06/07/2017    1:57 PM  Fall Risk   Falls in the past year? 1 0 0 0 No  Number falls in past yr: 0 0 0 0   Injury with Fall? 1 0 0 0   Comment Fx rt shoulder. Followed by Orthopedic      Risk for fall due to : Impaired balance/gait No Fall Risks No Fall Risks No Fall Risks   Follow up Falls prevention discussed Falls evaluation completed Falls evaluation completed Falls evaluation completed     MEDICARE RISK AT HOME:   TIMED UP AND GO:  Was the test performed?  No    Cognitive Function:        01/17/2023    3:19 PM 01/06/2022    4:43 PM  6CIT Screen  What Year? 0 points 0 points  What month? 0 points 0 points  What time? 0 points 0 points  Count back from 20 0 points 0 points  Months in reverse 0 points 0 points  Repeat phrase 0 points 0 points  Total Score 0 points 0 points    Immunizations Immunization History  Administered Date(s) Administered   Influenza, Quadrivalent, Recombinant, Inj, Pf 05/06/2014, 08/04/2015   Influenza,inj,Quad PF,6+ Mos 05/06/2014, 08/04/2015, 05/30/2017   PFIZER(Purple Top)SARS-COV-2 Vaccination 10/11/2019, 11/01/2019, 06/19/2020   PPD Test 02/12/2013   Tdap 11/01/2012    TDAP status: Due, Education has been provided regarding the importance of this vaccine. Advised may receive this vaccine at local pharmacy or Health Dept. Aware to provide a copy of the vaccination record if obtained from local pharmacy or Health Dept. Verbalized acceptance and understanding.  Flu Vaccine status: Declined, Education has been provided regarding the importance of this vaccine but patient  still declined. Advised may receive this vaccine at local pharmacy or Health Dept. Aware to provide a copy of the vaccination record if obtained from local pharmacy or Health Dept. Verbalized acceptance and understanding.    Covid-19 vaccine status: Completed vaccines  Qualifies for Shingles Vaccine? Yes   Zostavax completed No   Shingrix Completed?: No.    Education has been provided regarding the importance of this vaccine. Patient has been advised to call insurance company to determine out of pocket expense if they have not yet received this vaccine. Advised may also receive vaccine at local pharmacy or Health Dept. Verbalized acceptance and understanding.  Screening Tests Health Maintenance  Topic Date Due   Zoster Vaccines- Shingrix (1 of 2) Never done   DTaP/Tdap/Td (2 - Td or Tdap) 11/02/2022   COVID-19 Vaccine (4 - 2023-24 season) 02/02/2023 (Originally 04/01/2022)   INFLUENZA VACCINE  03/02/2023   Medicare Annual Wellness (AWV)  01/17/2024   MAMMOGRAM  10/18/2024   PAP SMEAR-Modifier  09/06/2025   Colonoscopy  11/16/2031   Hepatitis C Screening  Completed   HIV Screening  Completed   HPV VACCINES  Aged Out    Health Maintenance  Health Maintenance Due  Topic Date Due   Zoster Vaccines- Shingrix (1 of 2) Never done   DTaP/Tdap/Td (2 - Td or Tdap) 11/02/2022    Colorectal cancer screening: Type of screening: Colonoscopy. Completed 11/15/21. Repeat every 10 years  Mammogram status: Completed 10/19/22. Repeat every year    Lung Cancer Screening: (Low Dose CT Chest recommended if Age 32-80 years, 20 pack-year currently smoking OR have quit w/in 15years.) does not qualify.     Additional Screening:  Hepatitis C Screening: does qualify; Completed 10/25/16  Vision Screening: Recommended annual ophthalmology exams for early detection of glaucoma and other disorders of the eye. Is the patient up to date with their annual eye exam?  Yes  Who is the provider or what is the  name of the office in which the patient attends annual eye exams? Dr Clearance Coots If pt is not established with a provider, would they like to be referred to a provider to establish care? No .   Dental Screening: Recommended annual dental exams for proper oral hygiene    Community Resource Referral / Chronic Care Management:  CRR required this visit?  No   CCM required this visit?  No     Plan:     I have personally reviewed and noted the following in the patient's chart:   Medical and social history Use of alcohol, tobacco or illicit drugs  Current medications and supplements including opioid prescriptions. Patient is not currently taking opioid prescriptions. Functional ability and status Nutritional status Physical activity Advanced directives List of other physicians Hospitalizations, surgeries, and ER visits in previous 12 months Vitals Screenings to include cognitive, depression, and falls Referrals and appointments  In addition, I have reviewed  and discussed with patient certain preventive protocols, quality metrics, and best practice recommendations. A written personalized care plan for preventive services as well as general preventive health recommendations were provided to patient.     Tillie Rung, LPN   [id number]/61/0960   After Visit Summary: (MyChart) Due to this being a telephonic visit, the after visit summary with patients personalized plan was offered to patient via MyChart   Nurse Notes: None

## 2023-01-17 NOTE — Patient Instructions (Addendum)
Terri Roberts , Thank you for taking time to come for your Medicare Wellness Visit. I appreciate your ongoing commitment to your health goals. Please review the following plan we discussed and let me know if I can assist you in the future.   These are the goals we discussed:  Goals       My goal is to start walking for exercise.      Patient stated (pt-stated)      I want to stay off dialysis.        This is a list of the screening recommended for you and due dates:  Health Maintenance  Topic Date Due   Zoster (Shingles) Vaccine (1 of 2) Never done   DTaP/Tdap/Td vaccine (2 - Td or Tdap) 11/02/2022   COVID-19 Vaccine (4 - 2023-24 season) 02/02/2023*   Flu Shot  03/02/2023   Medicare Annual Wellness Visit  01/17/2024   Mammogram  10/18/2024   Pap Smear  09/06/2025   Colon Cancer Screening  11/16/2031   Hepatitis C Screening  Completed   HIV Screening  Completed   HPV Vaccine  Aged Out  *Topic was postponed. The date shown is not the original due date.    Advanced directives: Advance directive discussed with you today. Even though you declined this today, please call our office should you change your mind, and we can give you the proper paperwork for you to fill out.   Conditions/risks identified: None  Next appointment: Follow up in one year for your annual wellness visit.   Preventive Care 40-64 Years, Female Preventive care refers to lifestyle choices and visits with your health care provider that can promote health and wellness. What does preventive care include? A yearly physical exam. This is also called an annual well check. Dental exams once or twice a year. Routine eye exams. Ask your health care provider how often you should have your eyes checked. Personal lifestyle choices, including: Daily care of your teeth and gums. Regular physical activity. Eating a healthy diet. Avoiding tobacco and drug use. Limiting alcohol use. Practicing safe sex. Taking low-dose  aspirin daily starting at age 48. Taking vitamin and mineral supplements as recommended by your health care provider. What happens during an annual well check? The services and screenings done by your health care provider during your annual well check will depend on your age, overall health, lifestyle risk factors, and family history of disease. Counseling  Your health care provider may ask you questions about your: Alcohol use. Tobacco use. Drug use. Emotional well-being. Home and relationship well-being. Sexual activity. Eating habits. Work and work Astronomer. Method of birth control. Menstrual cycle. Pregnancy history. Screening  You may have the following tests or measurements: Height, weight, and BMI. Blood pressure. Lipid and cholesterol levels. These may be checked every 5 years, or more frequently if you are over 30 years old. Skin check. Lung cancer screening. You may have this screening every year starting at age 35 if you have a 30-pack-year history of smoking and currently smoke or have quit within the past 15 years. Fecal occult blood test (FOBT) of the stool. You may have this test every year starting at age 28. Flexible sigmoidoscopy or colonoscopy. You may have a sigmoidoscopy every 5 years or a colonoscopy every 10 years starting at age 41. Hepatitis C blood test. Hepatitis B blood test. Sexually transmitted disease (STD) testing. Diabetes screening. This is done by checking your blood sugar (glucose) after you have not eaten for  a while (fasting). You may have this done every 1-3 years. Mammogram. This may be done every 1-2 years. Talk to your health care provider about when you should start having regular mammograms. This may depend on whether you have a family history of breast cancer. BRCA-related cancer screening. This may be done if you have a family history of breast, ovarian, tubal, or peritoneal cancers. Pelvic exam and Pap test. This may be done every 3  years starting at age 64. Starting at age 70, this may be done every 5 years if you have a Pap test in combination with an HPV test. Bone density scan. This is done to screen for osteoporosis. You may have this scan if you are at high risk for osteoporosis. Discuss your test results, treatment options, and if necessary, the need for more tests with your health care provider. Vaccines  Your health care provider may recommend certain vaccines, such as: Influenza vaccine. This is recommended every year. Tetanus, diphtheria, and acellular pertussis (Tdap, Td) vaccine. You may need a Td booster every 10 years. Zoster vaccine. You may need this after age 36. Pneumococcal 13-valent conjugate (PCV13) vaccine. You may need this if you have certain conditions and were not previously vaccinated. Pneumococcal polysaccharide (PPSV23) vaccine. You may need one or two doses if you smoke cigarettes or if you have certain conditions. Talk to your health care provider about which screenings and vaccines you need and how often you need them. This information is not intended to replace advice given to you by your health care provider. Make sure you discuss any questions you have with your health care provider. Document Released: 08/14/2015 Document Revised: 04/06/2016 Document Reviewed: 05/19/2015 Elsevier Interactive Patient Education  2017 [employer].    Fall Prevention in the Home Falls can cause injuries. They can happen to people of all ages. There are many things you can do to make your home safe and to help prevent falls. What can I do on the outside of my home? Regularly fix the edges of walkways and driveways and fix any cracks. Remove anything that might make you trip as you walk through a door, such as a raised step or threshold. Trim any bushes or trees on the path to your home. Use bright outdoor lighting. Clear any walking paths of anything that might make someone trip, such as rocks or  tools. Regularly check to see if handrails are loose or broken. Make sure that both sides of any steps have handrails. Any raised decks and porches should have guardrails on the edges. Have any leaves, snow, or ice cleared regularly. Use sand or salt on walking paths during winter. Clean up any spills in your garage right away. This includes oil or grease spills. What can I do in the bathroom? Use night lights. Install grab bars by the toilet and in the tub and shower. Do not use towel bars as grab bars. Use non-skid mats or decals in the tub or shower. If you need to sit down in the shower, use a plastic, non-slip stool. Keep the floor dry. Clean up any water that spills on the floor as soon as it happens. Remove soap buildup in the tub or shower regularly. Attach bath mats securely with double-sided non-slip rug tape. Do not have throw rugs and other things on the floor that can make you trip. What can I do in the bedroom? Use night lights. Make sure that you have a light by your bed that is  easy to reach. Do not use any sheets or blankets that are too big for your bed. They should not hang down onto the floor. Have a firm chair that has side arms. You can use this for support while you get dressed. Do not have throw rugs and other things on the floor that can make you trip. What can I do in the kitchen? Clean up any spills right away. Avoid walking on wet floors. Keep items that you use a lot in easy-to-reach places. If you need to reach something above you, use a strong step stool that has a grab bar. Keep electrical cords out of the way. Do not use floor polish or wax that makes floors slippery. If you must use wax, use non-skid floor wax. Do not have throw rugs and other things on the floor that can make you trip. What can I do with my stairs? Do not leave any items on the stairs. Make sure that there are handrails on both sides of the stairs and use them. Fix handrails that are  broken or loose. Make sure that handrails are as long as the stairways. Check any carpeting to make sure that it is firmly attached to the stairs. Fix any carpet that is loose or worn. Avoid having throw rugs at the top or bottom of the stairs. If you do have throw rugs, attach them to the floor with carpet tape. Make sure that you have a light switch at the top of the stairs and the bottom of the stairs. If you do not have them, ask someone to add them for you. What else can I do to help prevent falls? Wear shoes that: Do not have high heels. Have rubber bottoms. Are comfortable and fit you well. Are closed at the toe. Do not wear sandals. If you use a stepladder: Make sure that it is fully opened. Do not climb a closed stepladder. Make sure that both sides of the stepladder are locked into place. Ask someone to hold it for you, if possible. Clearly mark and make sure that you can see: Any grab bars or handrails. First and last steps. Where the edge of each step is. Use tools that help you move around (mobility aids) if they are needed. These include: Canes. Walkers. Scooters. Crutches. Turn on the lights when you go into a dark area. Replace any light bulbs as soon as they burn out. Set up your furniture so you have a clear path. Avoid moving your furniture around. If any of your floors are uneven, fix them. If there are any pets around you, be aware of where they are. Review your medicines with your doctor. Some medicines can make you feel dizzy. This can increase your chance of falling. Ask your doctor what other things that you can do to help prevent falls. This information is not intended to replace advice given to you by your health care provider. Make sure you discuss any questions you have with your health care provider. Document Released: 05/14/2009 Document Revised: 12/24/2015 Document Reviewed: 08/22/2014 Elsevier Interactive Patient Education  2017 [employer].

## 2023-01-25 ENCOUNTER — Other Ambulatory Visit: Payer: Self-pay | Admitting: Internal Medicine

## 2023-01-31 DIAGNOSIS — M47816 Spondylosis without myelopathy or radiculopathy, lumbar region: Secondary | ICD-10-CM | POA: Diagnosis not present

## 2023-02-04 IMAGING — DX DG CHEST 2V
2 series · 2 of 2 positions shown · non-contrast
Comparison: 08/26/2017

CLINICAL DATA: Cough and nasal congestion for 2 days.

EXAM:
CHEST - 2 VIEW

[chest pa]
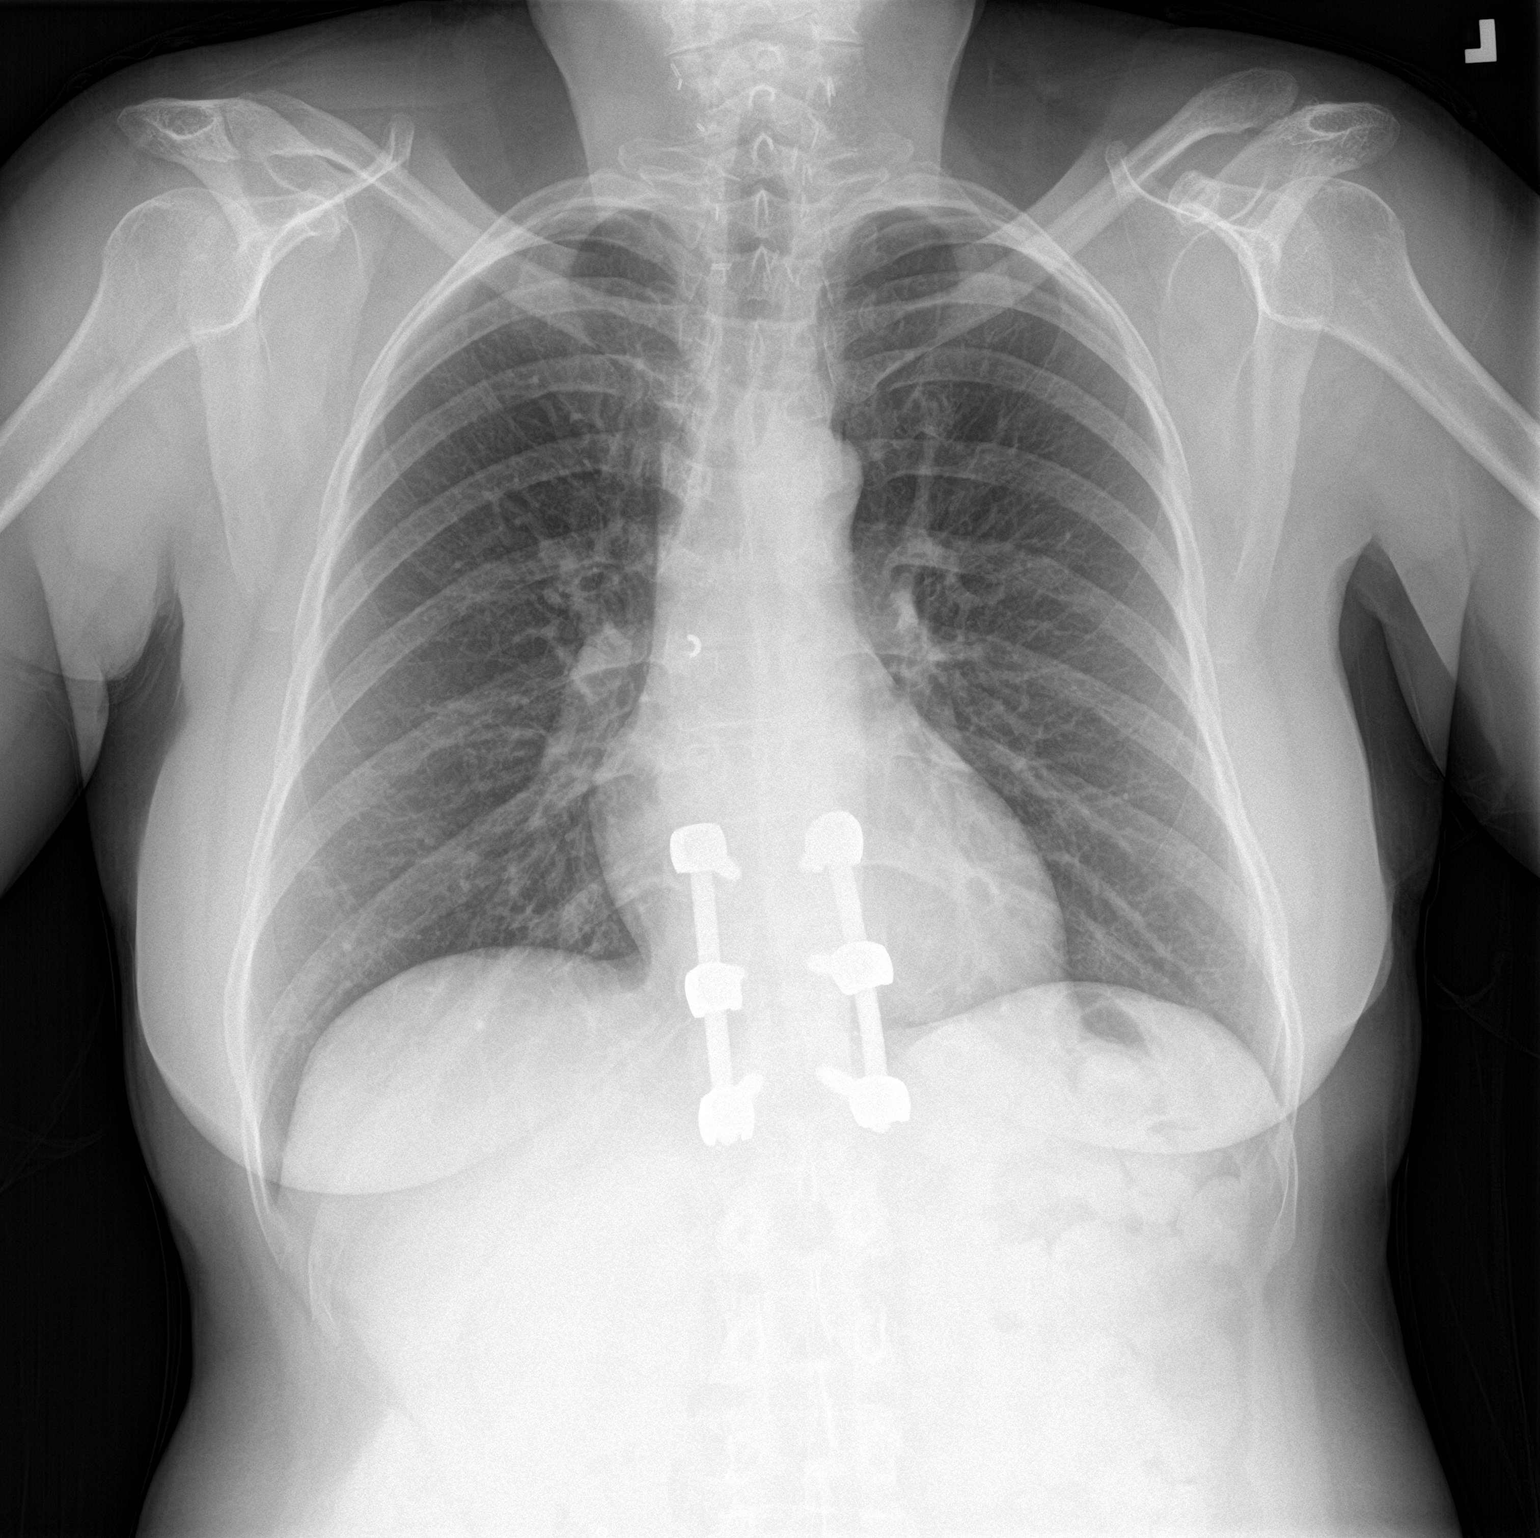

[chest lat]
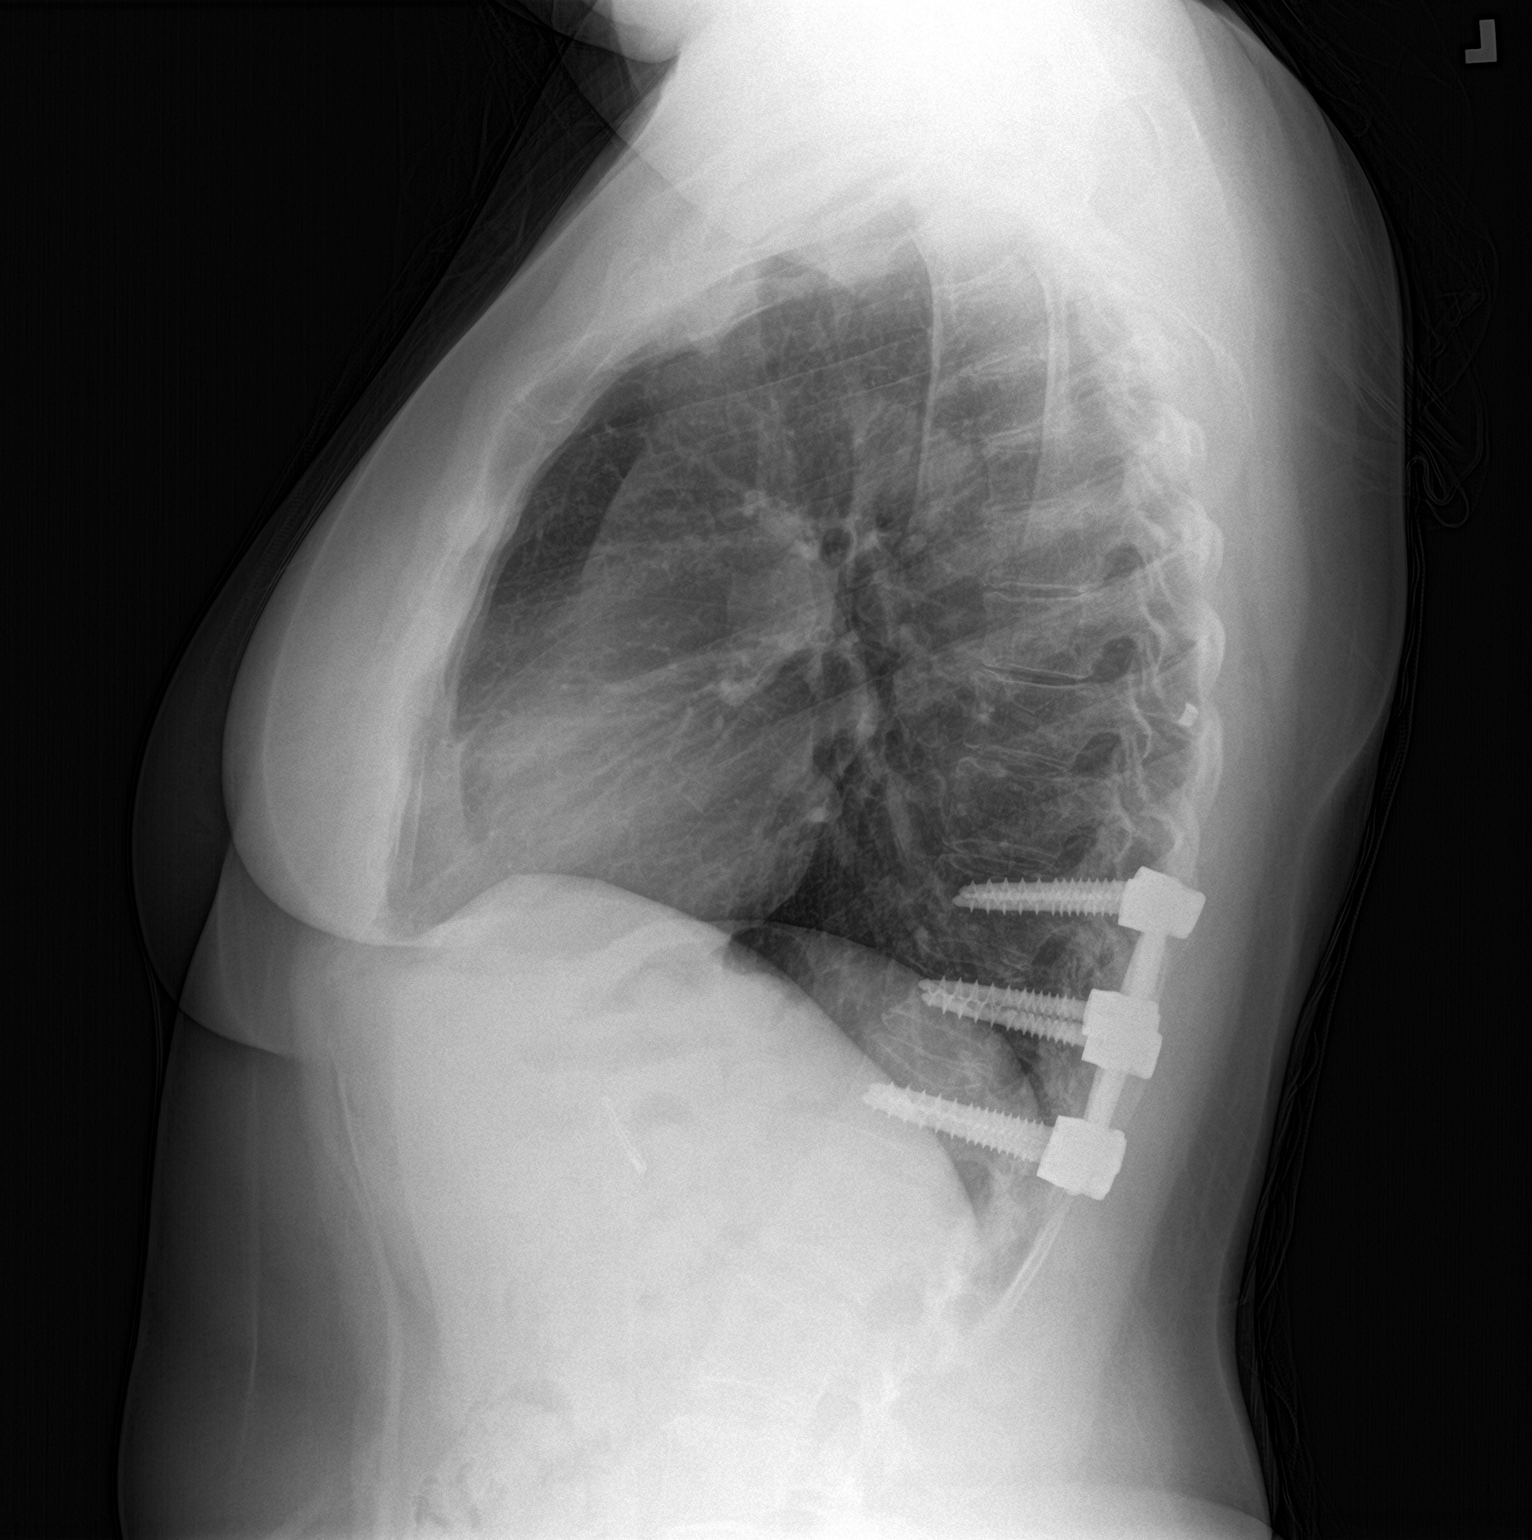

[2 of 2 positions shown; findings below may reference images not displayed]

FINDINGS: Lower thoracic spine fixation. Midline trachea. Normal heart size
and mediastinal contours. No pleural effusion or pneumothorax. Clear
lungs.
IMPRESSION: No acute cardiopulmonary disease.

## 2023-02-06 DIAGNOSIS — N184 Chronic kidney disease, stage 4 (severe): Secondary | ICD-10-CM | POA: Diagnosis not present

## 2023-03-14 DIAGNOSIS — N184 Chronic kidney disease, stage 4 (severe): Secondary | ICD-10-CM | POA: Diagnosis not present

## 2023-04-05 DIAGNOSIS — M47816 Spondylosis without myelopathy or radiculopathy, lumbar region: Secondary | ICD-10-CM | POA: Diagnosis not present

## 2023-04-10 DIAGNOSIS — M47816 Spondylosis without myelopathy or radiculopathy, lumbar region: Secondary | ICD-10-CM | POA: Diagnosis not present

## 2023-04-11 DIAGNOSIS — M3501 Sicca syndrome with keratoconjunctivitis: Secondary | ICD-10-CM | POA: Diagnosis not present

## 2023-04-11 DIAGNOSIS — M797 Fibromyalgia: Secondary | ICD-10-CM | POA: Diagnosis not present

## 2023-04-11 DIAGNOSIS — Z6825 Body mass index (BMI) 25.0-25.9, adult: Secondary | ICD-10-CM | POA: Diagnosis not present

## 2023-04-11 DIAGNOSIS — R634 Abnormal weight loss: Secondary | ICD-10-CM | POA: Diagnosis not present

## 2023-04-11 DIAGNOSIS — M5030 Other cervical disc degeneration, unspecified cervical region: Secondary | ICD-10-CM | POA: Diagnosis not present

## 2023-04-11 DIAGNOSIS — M65331 Trigger finger, right middle finger: Secondary | ICD-10-CM | POA: Diagnosis not present

## 2023-04-11 DIAGNOSIS — N1832 Chronic kidney disease, stage 3b: Secondary | ICD-10-CM | POA: Diagnosis not present

## 2023-04-11 DIAGNOSIS — E663 Overweight: Secondary | ICD-10-CM | POA: Diagnosis not present

## 2023-04-11 DIAGNOSIS — D631 Anemia in chronic kidney disease: Secondary | ICD-10-CM | POA: Diagnosis not present

## 2023-04-14 ENCOUNTER — Other Ambulatory Visit: Payer: Self-pay | Admitting: Internal Medicine

## 2023-04-19 DIAGNOSIS — Z79899 Other long term (current) drug therapy: Secondary | ICD-10-CM | POA: Diagnosis not present

## 2023-04-19 DIAGNOSIS — M3501 Sicca syndrome with keratoconjunctivitis: Secondary | ICD-10-CM | POA: Diagnosis not present

## 2023-05-02 DIAGNOSIS — N184 Chronic kidney disease, stage 4 (severe): Secondary | ICD-10-CM | POA: Diagnosis not present

## 2023-05-11 DIAGNOSIS — M47816 Spondylosis without myelopathy or radiculopathy, lumbar region: Secondary | ICD-10-CM | POA: Diagnosis not present

## 2023-05-11 DIAGNOSIS — Z6821 Body mass index (BMI) 21.0-21.9, adult: Secondary | ICD-10-CM | POA: Diagnosis not present

## 2023-05-11 DIAGNOSIS — M7061 Trochanteric bursitis, right hip: Secondary | ICD-10-CM | POA: Diagnosis not present

## 2023-05-17 DIAGNOSIS — M7061 Trochanteric bursitis, right hip: Secondary | ICD-10-CM | POA: Diagnosis not present

## 2023-05-19 DIAGNOSIS — N184 Chronic kidney disease, stage 4 (severe): Secondary | ICD-10-CM | POA: Diagnosis not present

## 2023-05-23 DIAGNOSIS — N184 Chronic kidney disease, stage 4 (severe): Secondary | ICD-10-CM | POA: Diagnosis not present

## 2023-05-23 DIAGNOSIS — N209 Urinary calculus, unspecified: Secondary | ICD-10-CM | POA: Diagnosis not present

## 2023-06-16 ENCOUNTER — Ambulatory Visit (INDEPENDENT_AMBULATORY_CARE_PROVIDER_SITE_OTHER): Payer: Medicare HMO | Admitting: Family Medicine

## 2023-06-16 ENCOUNTER — Ambulatory Visit (INDEPENDENT_AMBULATORY_CARE_PROVIDER_SITE_OTHER): Payer: Medicare HMO

## 2023-06-16 ENCOUNTER — Encounter: Payer: Self-pay | Admitting: Family Medicine

## 2023-06-16 VITALS — BP 124/76 | HR 67 | Temp 98.8°F | Ht 63.5 in | Wt 138.2 lb

## 2023-06-16 DIAGNOSIS — R319 Hematuria, unspecified: Secondary | ICD-10-CM

## 2023-06-16 DIAGNOSIS — R1030 Lower abdominal pain, unspecified: Secondary | ICD-10-CM | POA: Diagnosis not present

## 2023-06-16 DIAGNOSIS — K5900 Constipation, unspecified: Secondary | ICD-10-CM

## 2023-06-16 DIAGNOSIS — N200 Calculus of kidney: Secondary | ICD-10-CM | POA: Diagnosis not present

## 2023-06-16 DIAGNOSIS — N184 Chronic kidney disease, stage 4 (severe): Secondary | ICD-10-CM

## 2023-06-16 DIAGNOSIS — R112 Nausea with vomiting, unspecified: Secondary | ICD-10-CM | POA: Diagnosis not present

## 2023-06-16 DIAGNOSIS — I878 Other specified disorders of veins: Secondary | ICD-10-CM | POA: Diagnosis not present

## 2023-06-16 DIAGNOSIS — R918 Other nonspecific abnormal finding of lung field: Secondary | ICD-10-CM | POA: Diagnosis not present

## 2023-06-16 LAB — COMPREHENSIVE METABOLIC PANEL
ALT: 13 U/L (ref 0–35)
AST: 19 U/L (ref 0–37)
Albumin: 4.5 g/dL (ref 3.5–5.2)
Alkaline Phosphatase: 43 U/L (ref 39–117)
BUN: 25 mg/dL — ABNORMAL HIGH (ref 6–23)
CO2: 28 meq/L (ref 19–32)
Calcium: 8.9 mg/dL (ref 8.4–10.5)
Chloride: 107 meq/L (ref 96–112)
Creatinine, Ser: 2.47 mg/dL — ABNORMAL HIGH (ref 0.40–1.20)
GFR: 20.53 mL/min — ABNORMAL LOW (ref >60.00)
Glucose, Bld: 98 mg/dL (ref 70–99)
Potassium: 3.6 meq/L (ref 3.5–5.1)
Sodium: 143 meq/L (ref 135–145)
Total Bilirubin: 0.3 mg/dL (ref 0.2–1.2)
Total Protein: 7.3 g/dL (ref 6.0–8.3)

## 2023-06-16 LAB — POC URINALSYSI DIPSTICK (AUTOMATED)
Bilirubin, UA: NEGATIVE
Blood, UA: 25
Glucose, UA: NEGATIVE
Ketones, UA: NEGATIVE
Leukocytes, UA: NEGATIVE
Nitrite, UA: NEGATIVE
Protein, UA: POSITIVE — AB
Spec Grav, UA: 1.02 (ref 1.010–1.025)
Urobilinogen, UA: 0.2 U/dL
pH, UA: 6 (ref 5.0–8.0)

## 2023-06-16 LAB — CBC
HCT: 31.8 % — ABNORMAL LOW (ref 36.0–46.0)
Hemoglobin: 10.5 g/dL — ABNORMAL LOW (ref 12.0–15.0)
MCHC: 33.2 g/dL (ref 30.0–36.0)
MCV: 97.4 fL (ref 78.0–100.0)
Platelets: 205 10*3/uL (ref 150.0–400.0)
RBC: 3.26 Mil/uL — ABNORMAL LOW (ref 3.87–5.11)
RDW: 12.5 % (ref 11.5–15.5)
WBC: 7 10*3/uL (ref 4.0–10.5)

## 2023-06-16 MED ORDER — LINACLOTIDE 72 MCG PO CAPS: 72.0000 ug | ORAL_CAPSULE | Freq: Every day | ORAL | 0 refills | Status: AC

## 2023-06-16 NOTE — Progress Notes (Signed)
Subjective:     Patient ID: Terri Roberts, female    DOB: 02-28-1961, 62 y.o.   MRN: [id number]  Chief Complaint  Patient presents with   Nausea    Patient states started a week ago she has been taking zofran but if she "misses" the window it doesn't help. Patient doesn't know if this is a side affect to her kidney diease. Nausea starts around 3am. She does state having some pain all around her abdomin and lower back. No dizziness,      HPI   History of Present Illness         C/o a 1 wk hx of N/V and abdominal pain. Lower abdominal pain wrapping around to her lower back.  She has been mostly soup this week. Decreased appetite.   Hx of similar symptoms but more consistent this week.   States her nephrologist has told her in the past that her weight loss and decreased appetite is r/t kidney function.  GFR is 18%  States she has blood in her urine and saw nephrology in the past couple of weeks.  Renal US showed abnormal kidneys and bilateral non obstructing renal stones.   Hx of constipation and has been on Linzess but has been out. She thought it worked for her.    She has a GI in [place].   States she is UTD with colonoscopy and EGD approx 1 year ago.    Health Maintenance Due  Topic Date Due   Zoster Vaccines- Shingrix (1 of 2) Never done   DTaP/Tdap/Td (2 - Td or Tdap) 11/02/2022   INFLUENZA VACCINE  03/02/2023   COVID-19 Vaccine (4 - 2023-24 season) 04/02/2023    Past Medical History:  Diagnosis Date   Fibromyalgia    Migraine    Dr Neale Burly   Peripheral neuropathy    Sjogren's syndrome (HCC)    Dr Dareen Piano   Thyroid nodule     Past Surgical History:  Procedure Laterality Date   BACK SURGERY     Intrauterine Ablation  08/01/2009   Dr Stefano Gaul ( now seeing Dr Gerald Leitz)   lip biopsy  08/01/2012   Dr Ina Homes   MANDIBLE SURGERY     TMJ    THYROIDECTOMY  06/01/2009   benign nodule   TUBAL LIGATION      Family History  Problem Relation Age of  Onset   Stroke Mother 16   Diabetes Mother    Kidney failure Mother    AAA (abdominal aortic aneurysm) Father    Stroke Sister 59   Hypertension Sister    Diabetes Sister    Hypertension Sister    Heart attack Maternal Grandmother        ? age   Heart attack Maternal Grandfather        ? age   Diabetes Brother    Kidney failure Brother    Cancer Neg Hx    Breast cancer Neg Hx     Social History   Socioeconomic History   Marital status: Widowed    Spouse name: Not on file   Number of children: 2   Years of education: 14   Highest education level: Not on file  Occupational History   Occupation: Product Compliance Specialist  Tobacco Use   Smoking status: Never   Smokeless tobacco: Never  Vaping Use   Vaping status: Never Used  Substance and Sexual Activity   Alcohol use: Not Currently    Comment: rarely   Drug  use: Yes    Frequency: 2.0 times per week    Types: Marijuana    Comment: weekly   Sexual activity: Not on file  Other Topics Concern   Not on file  Social History Narrative   Lives at home with her daughter.   Right-handed.   One cup caffeine per day.   Social Determinants of Health   Financial Resource Strain: Low Risk  (01/17/2023)   Overall Financial Resource Strain (CARDIA)    Difficulty of Paying Living Expenses: Not hard at all  Food Insecurity: Low Risk  (05/02/2023)   Received from [facility]   Hunger Vital Sign    Worried About Running Out of Food in the Last Year: Never true    Ran Out of Food in the Last Year: Never true  Transportation Needs: No Transportation Needs (05/02/2023)   Received from [facility]    In the past 12 months, has lack of reliable transportation kept you from medical appointments, meetings, work or from getting things needed for daily living? : No  Physical Activity: Insufficiently Active (01/17/2023)   Exercise Vital Sign    Days of Exercise per Week: 3 days    Minutes of Exercise per Session:  20 min  Stress: No Stress Concern Present (01/17/2023)   [employer] of Occupational Health - Occupational Stress Questionnaire    Feeling of Stress : Not at all  Social Connections: Moderately Integrated (01/17/2023)   Social Connection and Isolation Panel [NHANES]    Frequency of Communication with Friends and Family: More than three times a week    Frequency of Social Gatherings with Friends and Family: More than three times a week    Attends Religious Services: More than 4 times per year    Active Member of [employer] or Organizations: Yes    Attends Banker Meetings: More than 4 times per year    Marital Status: Widowed  Intimate Partner Violence: Not At Risk (01/17/2023)   Humiliation, Afraid, Rape, and Kick questionnaire    Fear of Current or Ex-Partner: No    Emotionally Abused: No    Physically Abused: No    Sexually Abused: No    Outpatient Medications Prior to Visit  Medication Sig Dispense Refill   baclofen (LIORESAL) 10 MG tablet Take 10 mg by mouth as needed for muscle spasms.     calcitRIOL (ROCALTROL) 0.5 MCG capsule TAKE TWICE DAILY 60 capsule 0   cycloSPORINE (RESTASIS) 0.05 % ophthalmic emulsion Place 1 drop into both eyes 2 (two) times daily.     EMGALITY 120 MG/ML SOAJ Inject into the skin.     fluticasone (FLONASE) 50 MCG/ACT nasal spray Place 2 sprays into both nostrils daily. 16 g 0   gabapentin (NEURONTIN) 300 MG capsule TAKE 2 CAPSULES (600 MG TOTAL) BY MOUTH 2 (TWO) TIMES DAILY. 90 capsule 0   hydroxychloroquine (PLAQUENIL) 200 MG tablet Take 400 mg by mouth daily.      omeprazole (PRILOSEC) 40 MG capsule TAKE 1 CAPSULE (40 MG TOTAL) BY MOUTH DAILY. ANNUAL APPT IS DUE MUST SEE PROVIDER FOR FUTURE REFILLS 90 capsule 1   ondansetron (ZOFRAN) 4 MG tablet TAKE 1 TABLET BY MOUTH EVERY 8 HOURS AS NEEDED FOR NAUSEA AND VOMITING 30 tablet 5   pilocarpine (SALAGEN) 5 MG tablet TAKE 1 TABLET PO TWICE A DAY  3   rosuvastatin (CRESTOR) 5 MG tablet TAKE 1  TABLET (5 MG TOTAL) BY MOUTH DAILY. 90 tablet 1  topiramate (TOPAMAX) 200 MG tablet Take 200 mg by mouth at bedtime.      traMADol (ULTRAM) 50 MG tablet tramadol 50 mg tablet   100 mg by oral route.     zolmitriptan (ZOMIG) 5 MG nasal solution PLEASE SEE ATTACHED FOR DETAILED DIRECTIONS     linaclotide (LINZESS) 72 MCG capsule Take 1 capsule (72 mcg total) by mouth daily before breakfast. 30 capsule 5   levothyroxine (SYNTHROID) 125 MCG tablet TAKE 1 TABLET BY MOUTH EVERY DAY 90 tablet 0   No facility-administered medications prior to visit.    Allergies  Allergen Reactions   Nsaids Other (See Comments)    Has stage 3 CKD can not take Nsaids    Review of Systems  Constitutional:  Negative for chills and fever.  Respiratory:  Positive for cough. Negative for shortness of breath.   Cardiovascular:  Negative for chest pain, palpitations and leg swelling.  Gastrointestinal:  Positive for abdominal pain, constipation, nausea and vomiting. Negative for diarrhea.  Genitourinary:  Negative for dysuria, frequency and urgency.  Musculoskeletal:  Positive for back pain.  Neurological:  Negative for dizziness and focal weakness.       Objective:    Physical Exam Constitutional:      General: She is not in acute distress.    Appearance: She is ill-appearing.  HENT:     Mouth/Throat:     Mouth: Mucous membranes are moist.  Eyes:     Extraocular Movements: Extraocular movements intact.     Conjunctiva/sclera: Conjunctivae normal.  Cardiovascular:     Rate and Rhythm: Normal rate and regular rhythm.  Pulmonary:     Effort: Pulmonary effort is normal.     Breath sounds: Normal breath sounds.  Abdominal:     General: Bowel sounds are decreased. There is no distension.     Palpations: Abdomen is soft.     Tenderness: There is no abdominal tenderness. There is no guarding or rebound.  Musculoskeletal:     Cervical back: Normal range of motion and neck supple.  Skin:    General: Skin  is warm and dry.  Neurological:     General: No focal deficit present.     Mental Status: She is alert and oriented to person, place, and time.     Motor: No weakness.     Coordination: Coordination normal.     Gait: Gait normal.  Psychiatric:        Mood and Affect: Mood normal.        Behavior: Behavior normal.        Thought Content: Thought content normal.      BP 124/76 (BP Location: Left Arm, Patient Position: Sitting, Cuff Size: Normal)   Pulse 67   Temp 98.8 F (37.1 C) (Oral)   Ht 5' 3.5" (1.613 m)   Wt 138 lb 3.2 oz (62.7 kg)   SpO2 97%   BMI 24.10 kg/m  Wt Readings from Last 3 Encounters:  06/16/23 138 lb 3.2 oz (62.7 kg)  01/17/23 140 lb (63.5 kg)  12/19/22 144 lb (65.3 kg)       Assessment & Plan:   Problem List Items Addressed This Visit     Chronic renal disease, stage 4, severely decreased glomerular filtration rate (GFR) between 15-29 mL/min/1.73 square meter (HCC)   Relevant Orders   Comprehensive metabolic panel (Completed)   Lower abdominal pain   Relevant Orders   CBC (Completed)   Comprehensive metabolic panel (Completed)   DG Abd 2  Views (Completed)   POCT Urinalysis Dipstick (Automated) (Completed)   Nausea and vomiting - Primary   Relevant Orders   CBC (Completed)   Comprehensive metabolic panel (Completed)   DG Abd 2 Views (Completed)   Other Visit Diagnoses     Constipation, unspecified constipation type       Relevant Medications   linaclotide (LINZESS) 72 MCG capsule   Other Relevant Orders   CBC (Completed)   Comprehensive metabolic panel (Completed)   DG Abd 2 Views (Completed)   Hematuria, unspecified type       Relevant Orders   Ambulatory referral to Urology   Bilateral nephrolithiasis       Relevant Orders   Ambulatory referral to Urology      No red flag symptoms.  Reviewed EMR including recent nephrology notes and results.  UA shows 1+ blood today STAT abd X ray, CBC, CMP ordered. R/o obstruction.  Benign  abdominal exam.  Zofran at home  Recommend Miralax for constipation.  Referral to urology for renal stones as her symptoms may be related.  X ray shows moderate stool burden without obstruction. Refilled Linzess    I am having Terri Roberts maintain her topiramate, cycloSPORINE, hydroxychloroquine, pilocarpine, baclofen, calcitRIOL, traMADol, gabapentin, zolmitriptan, Emgality, fluticasone, levothyroxine, ondansetron, omeprazole, rosuvastatin, and linaclotide.  Meds ordered this encounter  Medications   linaclotide (LINZESS) 72 MCG capsule    Sig: Take 1 capsule (72 mcg total) by mouth daily before breakfast.    Dispense:  30 capsule    Refill:  0    Order Specific Question:   Supervising Provider    Answer:   Hillard Danker A [[id number]]

## 2023-06-16 NOTE — Patient Instructions (Signed)
Please go downstairs for labs and an X ray of your abdomen.

## 2023-07-10 DIAGNOSIS — M47816 Spondylosis without myelopathy or radiculopathy, lumbar region: Secondary | ICD-10-CM | POA: Diagnosis not present

## 2023-07-10 DIAGNOSIS — M7061 Trochanteric bursitis, right hip: Secondary | ICD-10-CM | POA: Diagnosis not present

## 2023-07-10 DIAGNOSIS — Z79891 Long term (current) use of opiate analgesic: Secondary | ICD-10-CM | POA: Diagnosis not present

## 2023-07-10 DIAGNOSIS — G894 Chronic pain syndrome: Secondary | ICD-10-CM | POA: Diagnosis not present

## 2023-07-10 DIAGNOSIS — Z79899 Other long term (current) drug therapy: Secondary | ICD-10-CM | POA: Diagnosis not present

## 2023-07-10 DIAGNOSIS — M542 Cervicalgia: Secondary | ICD-10-CM | POA: Diagnosis not present

## 2023-07-20 DIAGNOSIS — H524 Presbyopia: Secondary | ICD-10-CM | POA: Diagnosis not present

## 2023-08-28 ENCOUNTER — Other Ambulatory Visit: Payer: Self-pay | Admitting: Internal Medicine

## 2023-08-28 DIAGNOSIS — N952 Postmenopausal atrophic vaginitis: Secondary | ICD-10-CM | POA: Diagnosis not present

## 2023-08-28 DIAGNOSIS — R102 Pelvic and perineal pain: Secondary | ICD-10-CM | POA: Diagnosis not present

## 2023-08-28 DIAGNOSIS — R310 Gross hematuria: Secondary | ICD-10-CM | POA: Diagnosis not present

## 2023-08-28 DIAGNOSIS — N200 Calculus of kidney: Secondary | ICD-10-CM | POA: Diagnosis not present

## 2023-09-04 DIAGNOSIS — M329 Systemic lupus erythematosus, unspecified: Secondary | ICD-10-CM | POA: Diagnosis not present

## 2023-09-04 DIAGNOSIS — E890 Postprocedural hypothyroidism: Secondary | ICD-10-CM | POA: Diagnosis not present

## 2023-09-04 DIAGNOSIS — E892 Postprocedural hypoparathyroidism: Secondary | ICD-10-CM | POA: Diagnosis not present

## 2023-09-12 ENCOUNTER — Other Ambulatory Visit (HOSPITAL_COMMUNITY)
Admission: RE | Admit: 2023-09-12 | Discharge: 2023-09-12 | Disposition: A | Discharge status: Home / Self Care | Payer: Medicare HMO | Source: Ambulatory Visit | Attending: Obstetrics and Gynecology | Admitting: Obstetrics and Gynecology

## 2023-09-12 ENCOUNTER — Other Ambulatory Visit: Payer: Self-pay | Admitting: Internal Medicine

## 2023-09-12 ENCOUNTER — Other Ambulatory Visit: Payer: Self-pay | Admitting: Obstetrics and Gynecology

## 2023-09-12 DIAGNOSIS — Z113 Encounter for screening for infections with a predominantly sexual mode of transmission: Secondary | ICD-10-CM | POA: Insufficient documentation

## 2023-09-12 DIAGNOSIS — Z1231 Encounter for screening mammogram for malignant neoplasm of breast: Secondary | ICD-10-CM

## 2023-09-12 DIAGNOSIS — Z01419 Encounter for gynecological examination (general) (routine) without abnormal findings: Secondary | ICD-10-CM | POA: Insufficient documentation

## 2023-09-12 DIAGNOSIS — Z1151 Encounter for screening for human papillomavirus (HPV): Secondary | ICD-10-CM | POA: Insufficient documentation

## 2023-09-12 DIAGNOSIS — R896 Abnormal cytological findings in specimens from other organs, systems and tissues: Secondary | ICD-10-CM | POA: Diagnosis present

## 2023-09-14 DIAGNOSIS — N184 Chronic kidney disease, stage 4 (severe): Secondary | ICD-10-CM | POA: Diagnosis not present

## 2023-09-18 LAB — CYTOLOGY - PAP
Comment: NEGATIVE
Comment: NEGATIVE
Comment: NEGATIVE
Diagnosis: UNDETERMINED — AB
HPV 16: POSITIVE — AB
HPV 18 / 45: NEGATIVE
High risk HPV: POSITIVE — AB

## 2023-10-14 ENCOUNTER — Other Ambulatory Visit: Payer: Self-pay | Admitting: Internal Medicine

## 2023-10-14 ENCOUNTER — Encounter: Payer: Self-pay | Admitting: Internal Medicine

## 2023-10-16 ENCOUNTER — Encounter: Payer: Self-pay | Admitting: Internal Medicine

## 2023-10-17 ENCOUNTER — Encounter: Payer: Self-pay | Admitting: Family Medicine

## 2023-10-17 MED ORDER — ONDANSETRON HCL 4 MG PO TABS: ORAL_TABLET | ORAL | 1 refills | Status: DC

## 2023-10-19 ENCOUNTER — Other Ambulatory Visit: Payer: Self-pay | Admitting: Obstetrics and Gynecology

## 2023-10-20 ENCOUNTER — Ambulatory Visit: Payer: Medicare HMO

## 2023-10-20 ENCOUNTER — Ambulatory Visit
Admission: RE | Admit: 2023-10-20 | Discharge: 2023-10-20 | Disposition: A | Discharge status: Home / Self Care | Payer: Medicare HMO | Source: Ambulatory Visit | Attending: Internal Medicine | Admitting: Internal Medicine

## 2023-10-20 DIAGNOSIS — Z1231 Encounter for screening mammogram for malignant neoplasm of breast: Secondary | ICD-10-CM

## 2023-10-24 LAB — SURGICAL PATHOLOGY

## 2023-11-28 DIAGNOSIS — N184 Chronic kidney disease, stage 4 (severe): Secondary | ICD-10-CM | POA: Diagnosis not present

## 2023-11-28 DIAGNOSIS — R102 Pelvic and perineal pain: Secondary | ICD-10-CM | POA: Diagnosis not present

## 2023-11-28 DIAGNOSIS — K5900 Constipation, unspecified: Secondary | ICD-10-CM | POA: Diagnosis not present

## 2023-12-04 DIAGNOSIS — J209 Acute bronchitis, unspecified: Secondary | ICD-10-CM | POA: Diagnosis not present

## 2023-12-04 DIAGNOSIS — R051 Acute cough: Secondary | ICD-10-CM | POA: Diagnosis not present

## 2023-12-26 DIAGNOSIS — Z949 Transplanted organ and tissue status, unspecified: Secondary | ICD-10-CM | POA: Diagnosis not present

## 2024-01-08 DIAGNOSIS — N184 Chronic kidney disease, stage 4 (severe): Secondary | ICD-10-CM | POA: Diagnosis not present

## 2024-01-08 DIAGNOSIS — Z949 Transplanted organ and tissue status, unspecified: Secondary | ICD-10-CM | POA: Diagnosis not present

## 2024-01-09 DIAGNOSIS — G894 Chronic pain syndrome: Secondary | ICD-10-CM | POA: Diagnosis not present

## 2024-01-09 DIAGNOSIS — M545 Low back pain, unspecified: Secondary | ICD-10-CM | POA: Diagnosis not present

## 2024-01-09 DIAGNOSIS — M47816 Spondylosis without myelopathy or radiculopathy, lumbar region: Secondary | ICD-10-CM | POA: Diagnosis not present

## 2024-01-11 DIAGNOSIS — Z949 Transplanted organ and tissue status, unspecified: Secondary | ICD-10-CM | POA: Diagnosis not present

## 2024-01-22 DIAGNOSIS — Z949 Transplanted organ and tissue status, unspecified: Secondary | ICD-10-CM | POA: Diagnosis not present

## 2024-01-24 ENCOUNTER — Ambulatory Visit: Payer: Medicare HMO

## 2024-01-25 ENCOUNTER — Other Ambulatory Visit: Payer: Self-pay | Admitting: Internal Medicine

## 2024-02-08 ENCOUNTER — Ambulatory Visit

## 2024-02-13 ENCOUNTER — Ambulatory Visit (INDEPENDENT_AMBULATORY_CARE_PROVIDER_SITE_OTHER)

## 2024-02-13 VITALS — Ht 63.5 in | Wt 138.0 lb

## 2024-02-13 DIAGNOSIS — Z0000 Encounter for general adult medical examination without abnormal findings: Secondary | ICD-10-CM

## 2024-02-13 NOTE — Progress Notes (Signed)
 Subjective:   Terri Roberts is a 63 y.o. who presents for a Medicare Wellness preventive visit.  As a reminder, Annual Wellness Visits don't include a physical exam, and some assessments may be limited, especially if this visit is performed virtually. We may recommend an in-person follow-up visit with your provider if needed.  Visit Complete: Virtual I connected with  Terri Roberts on 02/13/24 by a audio enabled telemedicine application and verified that I am speaking with the correct person using two identifiers.  Patient Location: Home  Provider Location: Home Office  I discussed the limitations of evaluation and management by telemedicine. The patient expressed understanding and agreed to proceed.  Vital Signs: Because this visit was a virtual/telehealth visit, some criteria may be missing or patient reported. Any vitals not documented were not able to be obtained and vitals that have been documented are patient reported.  VideoDeclined- This patient declined Librarian, academic. Therefore the visit was completed with audio only.  Persons Participating in Visit: Patient.  AWV Questionnaire: No: Patient Medicare AWV questionnaire was not completed prior to this visit.  Cardiac Risk Factors include: advanced age (>5men, >62 women);Other (see comment), Risk factor comments: OSA     Objective:    Today's Vitals   02/13/24 1301  Weight: 138 lb (62.6 kg)  Height: 5' 3.5 (1.613 m)  PainSc: 7    Body mass index is 24.06 kg/m.     02/13/2024    1:24 PM 01/17/2023    3:18 PM 05/28/2022    8:13 AM 01/06/2022    4:25 PM 10/25/2016   12:00 AM 10/24/2016    9:23 PM 04/24/2016    8:56 PM  Advanced Directives  Does Patient Have a Medical Advance Directive? No No No No No  No  No   Would patient like information on creating a medical advance directive? No - Patient declined No - Patient declined No - Patient declined No - Patient declined No - Patient  declined   No - patient declined information      Data saved with a previous flowsheet row definition    Current Medications (verified) Outpatient Encounter Medications as of 02/13/2024  Medication Sig   baclofen (LIORESAL) 10 MG tablet Take 10 mg by mouth as needed for muscle spasms.   calcitRIOL  (ROCALTROL ) 0.5 MCG capsule TAKE TWICE DAILY   cycloSPORINE (RESTASIS) 0.05 % ophthalmic emulsion Place 1 drop into both eyes 2 (two) times daily.   EMGALITY 120 MG/ML SOAJ Inject into the skin.   fluticasone  (FLONASE ) 50 MCG/ACT nasal spray Place 2 sprays into both nostrils daily.   hydroxychloroquine  (PLAQUENIL ) 200 MG tablet Take 400 mg by mouth daily.    linaclotide  (LINZESS ) 72 MCG capsule Take 1 capsule (72 mcg total) by mouth daily before breakfast.   omeprazole  (PRILOSEC) 40 MG capsule TAKE 1 CAPSULE (40 MG TOTAL) BY MOUTH DAILY. ANNUAL APPT IS DUE MUST SEE PROVIDER FOR FUTURE REFILLS   ondansetron  (ZOFRAN ) 4 MG tablet TAKE 1 TABLET BY MOUTH EVERY 8 HOURS AS NEEDED FOR NAUSEA AND VOMITING   pilocarpine  (SALAGEN ) 5 MG tablet TAKE 1 TABLET PO TWICE A DAY   rosuvastatin  (CRESTOR ) 5 MG tablet TAKE 1 TABLET (5 MG TOTAL) BY MOUTH DAILY.   SYNTHROID  112 MCG tablet Take 112 mcg by mouth every morning.   topiramate  (TOPAMAX ) 200 MG tablet Take 200 mg by mouth at bedtime.    traMADol  (ULTRAM ) 50 MG tablet tramadol  50 mg tablet   100  mg by oral route.   zolmitriptan (ZOMIG) 5 MG nasal solution PLEASE SEE ATTACHED FOR DETAILED DIRECTIONS   gabapentin  (NEURONTIN ) 300 MG capsule TAKE 2 CAPSULES (600 MG TOTAL) BY MOUTH 2 (TWO) TIMES DAILY. (Patient not taking: Reported on 02/13/2024)   No facility-administered encounter medications on file as of 02/13/2024.    Allergies (verified) Nsaids   History: Past Medical History:  Diagnosis Date   Fibromyalgia    Migraine    Dr Oneita   Peripheral neuropathy    Sjogren's syndrome (HCC)    Dr Lenon   Tachycardia    Thyroid  nodule    Past Surgical  History:  Procedure Laterality Date   BACK SURGERY     Intrauterine Ablation  08/01/2009   Dr Manda ( now seeing Dr Rexene Hoit)   lip biopsy  08/01/2012   Dr Karis DIMMER   MANDIBLE SURGERY     TMJ    THYROIDECTOMY  06/01/2009   benign nodule   TUBAL LIGATION     Family History  Problem Relation Age of Onset   Stroke Mother 23   Diabetes Mother    Kidney failure Mother    AAA (abdominal aortic aneurysm) Father    Stroke Sister 78   Hypertension Sister    Diabetes Sister    Hypertension Sister    Heart attack Maternal Grandmother        ? age   Heart attack Maternal Grandfather        ? age   Diabetes Brother    Kidney failure Brother    Cancer Neg Hx    Breast cancer Neg Hx    Social History   Socioeconomic History   Marital status: Widowed    Spouse name: Not on file   Number of children: 2   Years of education: 14   Highest education level: Not on file  Occupational History   Occupation: Product Compliance Specialist   Occupation: disablied  Tobacco Use   Smoking status: Never   Smokeless tobacco: Never  Vaping Use   Vaping status: Never Used  Substance and Sexual Activity   Alcohol use: Not Currently    Comment: rarely   Drug use: Yes    Frequency: 2.0 times per week    Types: Marijuana    Comment: weekly   Sexual activity: Not on file  Other Topics Concern   Not on file  Social History Narrative   Lives at home with her daughter and granddaughter/2025 and 1 dog   Right-handed.   One cup caffeine per day.   Social Drivers of Corporate investment banker Strain: High Risk (02/13/2024)   Overall Financial Resource Strain (CARDIA)    Difficulty of Paying Living Expenses: Very hard  Food Insecurity: No Food Insecurity (02/13/2024)   Hunger Vital Sign    Worried About Running Out of Food in the Last Year: Never true    Ran Out of Food in the Last Year: Never true  Transportation Needs: No Transportation Needs (02/13/2024)   PRAPARE - Therapist, art (Medical): No    Lack of Transportation (Non-Medical): No  Physical Activity: Insufficiently Active (02/13/2024)   Exercise Vital Sign    Days of Exercise per Week: 2 days    Minutes of Exercise per Session: 30 min  Stress: Stress Concern Present (02/13/2024)   [employer] of Occupational Health - Occupational Stress Questionnaire    Feeling of Stress: To some extent  Social Connections: Moderately Isolated (  02/13/2024)   Social Connection and Isolation Panel    Frequency of Communication with Friends and Family: More than three times a week    Frequency of Social Gatherings with Friends and Family: Once a week    Attends Religious Services: More than 4 times per year    Active Member of [employer] or Organizations: No    Attends Banker Meetings: Never    Marital Status: Widowed    Tobacco Counseling Counseling given: Not Answered    Clinical Intake:  Pre-visit preparation completed: Yes  Pain : 0-10 Pain Score: 7  Pain Type: Chronic pain Pain Location: Knee Pain Orientation:  (all over and both legs) Pain Descriptors / Indicators: Aching, Discomfort Pain Frequency: Other (Comment) Pain Relieving Factors: Tramadol  and Hydrocodone   Pain Relieving Factors: Tramadol  and Hydrocodone   BMI - recorded: 24.06 Nutritional Status: BMI of 19-24  Normal Nutritional Risks: Nausea/ vomitting/ diarrhea (daily)  Lab Results  Component Value Date   HGBA1C 5.3 01/12/2016   HGBA1C 5.2 09/16/2008     How often do you need to have someone help you when you read instructions, pamphlets, or other written materials from your doctor or pharmacy?: 1 - Never     Information entered by :: Tallie Dodds, RMA   Activities of Daily Living     02/13/2024    1:02 PM  In your present state of health, do you have any difficulty performing the following activities:  Hearing? 0  Vision? 0  Difficulty concentrating or making decisions? 0  Walking or  climbing stairs? 0  Dressing or bathing? 0  Doing errands, shopping? 0  Preparing Food and eating ? N  Using the Toilet? N  In the past six months, have you accidently leaked urine? N  Do you have problems with loss of bowel control? N  Managing your Medications? N  Managing your Finances? N  Housekeeping or managing your Housekeeping? N    Patient Care Team: Geofm Glade PARAS, MD as PCP - General (Internal Medicine) Leni Marjory MATSU, MD (Rheumatology) Rudy Greig GAILS, OD as Consulting Physician (Optometry)  I have updated your Care Teams any recent Medical Services you may have received from other providers in the past year.     Assessment:   This is a routine wellness examination for Austina.  Hearing/Vision screen Hearing Screening - Comments:: Denies hearing difficulties   Vision Screening - Comments:: Wears eyeglasses/Dr. Greig Rudy   Goals Addressed               This Visit's Progress     Patient stated (pt-stated)   On track     I want to stay off dialysis.       Depression Screen     02/13/2024    1:28 PM 06/16/2023    9:39 AM 01/17/2023    3:13 PM 12/19/2022    3:29 PM 03/11/2022    9:04 AM 01/06/2022    4:29 PM 06/07/2017    1:57 PM  PHQ 2/9 Scores  PHQ - 2 Score 2 2 0 0 2 2 0  PHQ- 9 Score 14 7   12 7      Fall Risk     02/13/2024    1:25 PM 06/16/2023    9:38 AM 01/17/2023    3:16 PM 12/19/2022    3:29 PM 03/11/2022    9:04 AM  Fall Risk   Falls in the past year? 1 0 1 0 0  Number falls in past yr:  0 0 0 0 0  Comment in May      Injury with Fall? 0 0 1 0 0  Comment   Fx rt shoulder. Followed by Orthopedic    Risk for fall due to : Impaired balance/gait No Fall Risks Impaired balance/gait No Fall Risks No Fall Risks  Follow up Falls evaluation completed;Falls prevention discussed Falls evaluation completed Falls prevention discussed Falls evaluation completed Falls evaluation completed      Data saved with a previous flowsheet row definition     MEDICARE RISK AT HOME:  Medicare Risk at Home Any stairs in or around the home?: No If so, are there any without handrails?: No Home free of loose throw rugs in walkways, pet beds, electrical cords, etc?: Yes Adequate lighting in your home to reduce risk of falls?: Yes Life alert?: No Use of a cane, walker or w/c?: No Grab bars in the bathroom?: Yes Shower chair or bench in shower?: Yes Elevated toilet seat or a handicapped toilet?: Yes  TIMED UP AND GO:  Was the test performed?  No  Cognitive Function: Declined/Normal: No cognitive concerns noted by patient or family. Patient alert, oriented, able to answer questions appropriately and recall recent events. No signs of memory loss or confusion.        01/17/2023    3:19 PM 01/06/2022    4:43 PM  6CIT Screen  What Year? 0 points 0 points  What month? 0 points 0 points  What time? 0 points 0 points  Count back from 20 0 points 0 points  Months in reverse 0 points 0 points  Repeat phrase 0 points 0 points  Total Score 0 points 0 points    Immunizations Immunization History  Administered Date(s) Administered   Influenza, Quadrivalent, Recombinant, Inj, Pf 05/06/2014, 08/04/2015   Influenza,inj,Quad PF,6+ Mos 05/06/2014, 08/04/2015, 05/30/2017   MMR 01/02/1995   PFIZER(Purple Top)SARS-COV-2 Vaccination 10/11/2019, 11/01/2019, 06/19/2020   PPD Test 02/12/2013   Tdap 11/01/2012    Screening Tests Health Maintenance  Topic Date Due   Pneumococcal Vaccine 46-50 Years old (1 of 2 - PCV) Never done   Zoster Vaccines- Shingrix (1 of 2) Never done   DTaP/Tdap/Td (2 - Td or Tdap) 11/02/2022   COVID-19 Vaccine (4 - 2024-25 season) 04/02/2023   INFLUENZA VACCINE  03/01/2024   Medicare Annual Wellness (AWV)  02/12/2025   MAMMOGRAM  10/19/2025   Cervical Cancer Screening (HPV/Pap Cotest)  09/11/2028   Colonoscopy  11/16/2031   Hepatitis C Screening  Completed   HIV Screening  Completed   Hepatitis B Vaccines  Aged Out    HPV VACCINES  Aged Out   Meningococcal B Vaccine  Aged Out    Health Maintenance  Health Maintenance Due  Topic Date Due   Pneumococcal Vaccine 47-9 Years old (1 of 2 - PCV) Never done   Zoster Vaccines- Shingrix (1 of 2) Never done   DTaP/Tdap/Td (2 - Td or Tdap) 11/02/2022   COVID-19 Vaccine (4 - 2024-25 season) 04/02/2023   Health Maintenance Items Addressed: See Nurse Notes at the end of this note  Additional Screening:  Vision Screening: Recommended annual ophthalmology exams for early detection of glaucoma and other disorders of the eye. Would you like a referral to an eye doctor? No    Dental Screening: Recommended annual dental exams for proper oral hygiene  Community Resource Referral / Chronic Care Management: CRR required this visit?  No   CCM required this visit?  No   Plan:  I have personally reviewed and noted the following in the patient's chart:   Medical and social history Use of alcohol, tobacco or illicit drugs  Current medications and supplements including opioid prescriptions. Patient is currently taking opioid prescriptions. Information provided to patient regarding non-opioid alternatives. Patient advised to discuss non-opioid treatment plan with their provider. Functional ability and status Nutritional status Physical activity Advanced directives List of other physicians Hospitalizations, surgeries, and ER visits in previous 12 months Vitals Screenings to include cognitive, depression, and falls Referrals and appointments  In addition, I have reviewed and discussed with patient certain preventive protocols, quality metrics, and best practice recommendations. A written personalized care plan for preventive services as well as general preventive health recommendations were provided to patient.   Jahniah Pallas L Susen Haskew, CMA   02/13/2024   After Visit Summary: (MyChart) Due to this being a telephonic visit, the after visit summary with patients  personalized plan was offered to patient via MyChart   Notes: Patient is due for a Tdap.  She declines the pneumonia and shingrix vaccines at this time.

## 2024-02-13 NOTE — Patient Instructions (Addendum)
 [name] , Thank you for taking time out of your busy schedule to complete your Annual Wellness Visit with me. I enjoyed our conversation and look forward to speaking with you again next year. I, as well as your care team,  appreciate your ongoing commitment to your health goals. Please review the following plan we discussed and let me know if I can assist you in the future. Your Game plan/ To Do List   Follow up Visits: Next Medicare AWV with our clinical staff: 02/13/2025.   Have you seen your provider in the last 6 months (3 months if uncontrolled diabetes)? No Next Office Visit with your provider: Patient stated that she will call office closer to the time for her yearly visit. Last office was 06/16/2023.  Clinician Recommendations:  Aim for 30 minutes of exercise or brisk walking, 6-8 glasses of water, and 5 servings of fruits and vegetables each day.  You are due for a tetanus vaccine      This is a list of the screening recommended for you and due dates:  Health Maintenance  Topic Date Due   Pneumococcal Vaccination (1 of 2 - PCV) Never done   Zoster (Shingles) Vaccine (1 of 2) Never done   DTaP/Tdap/Td vaccine (2 - Td or Tdap) 11/02/2022   COVID-19 Vaccine (4 - 2024-25 season) 04/02/2023   Flu Shot  03/01/2024   Medicare Annual Wellness Visit  02/12/2025   Mammogram  10/19/2025   Pap with HPV screening  09/11/2028   Colon Cancer Screening  11/16/2031   Hepatitis C Screening  Completed   HIV Screening  Completed   Hepatitis B Vaccine  Aged Out   HPV Vaccine  Aged Out   Meningitis B Vaccine  Aged Out    Advanced directives: (Declined) Advance directive discussed with you today. Even though you declined this today, please call our office should you change your mind, and we can give you the proper paperwork for you to fill out. Advance Care Planning is important because it:  [x]  Makes sure you receive the medical care that is consistent with your values, goals, and  preferences  [x]  It provides guidance to your family and loved ones and reduces their decisional burden about whether or not they are making the right decisions based on your wishes.  Follow the link provided in your after visit summary or read over the paperwork we have mailed to you to help you started getting your Advance Directives in place. If you need assistance in completing these, please reach out to us  so that we can help you!  See attachments for Preventive Care and Fall Prevention Tips.   Managing Pain Without Opioids Opioids are strong medicines used to treat moderate to severe pain. For some people, especially those who have long-term (chronic) pain, opioids may not be the best choice for pain management due to: Side effects like nausea, constipation, and sleepiness. The risk of addiction (opioid use disorder). The longer you take opioids, the greater your risk of addiction. Pain that lasts for more than 3 months is called chronic pain. Managing chronic pain usually requires more than one approach and is often provided by a team of health care providers working together (multidisciplinary approach). Pain management may be done at a pain management center or pain clinic. How to manage pain without the use of opioids Use non-opioid medicines Non-opioid medicines for pain may include: Over-the-counter or prescription non-steroidal anti-inflammatory drugs (NSAIDs). These may be the first medicines used  for pain. They work well for muscle and bone pain, and they reduce swelling. Acetaminophen . This over-the-counter medicine may work well for milder pain but not swelling. Antidepressants. These may be used to treat chronic pain. A certain type of antidepressant (tricyclics) is often used. These medicines are given in lower doses for pain than when used for depression. Anticonvulsants. These are usually used to treat seizures but may also reduce nerve (neuropathic) pain. Muscle relaxants.  These relieve pain caused by sudden muscle tightening (spasms). You may also use a pain medicine that is applied to the skin as a patch, cream, or gel (topical analgesic), such as a numbing medicine. These may cause fewer side effects than medicines taken by mouth. Do certain therapies as directed Some therapies can help with pain management. They include: Physical therapy. You will do exercises to gain strength and flexibility. A physical therapist may teach you exercises to move and stretch parts of your body that are weak, stiff, or painful. You can learn these exercises at physical therapy visits and practice them at home. Physical therapy may also involve: Massage. Heat wraps or applying heat or cold to affected areas. Electrical signals that interrupt pain signals (transcutaneous electrical nerve stimulation, TENS). Weak lasers that reduce pain and swelling (low-level laser therapy). Signals from your body that help you learn to regulate pain (biofeedback). Occupational therapy. This helps you to learn ways to function at home and work with less pain. Recreational therapy. This involves trying new activities or hobbies, such as a physical activity or drawing. Mental health therapy, including: Cognitive behavioral therapy (CBT). This helps you learn coping skills for dealing with pain. Acceptance and commitment therapy (ACT) to change the way you think and react to pain. Relaxation therapies, including muscle relaxation exercises and mindfulness-based stress reduction. Pain management counseling. This may be individual, family, or group counseling.  Receive medical treatments Medical treatments for pain management include: Nerve block injections. These may include a pain blocker and anti-inflammatory medicines. You may have injections: Near the spine to relieve chronic back or neck pain. Into joints to relieve back or joint pain. Into nerve areas that supply a painful area to relieve body  pain. Into muscles (trigger point injections) to relieve some painful muscle conditions. A medical device placed near your spine to help block pain signals and relieve nerve pain or chronic back pain (spinal cord stimulation device). Acupuncture. Follow these instructions at home Medicines Take over-the-counter and prescription medicines only as told by your health care provider. If you are taking pain medicine, ask your health care providers about possible side effects to watch out for. Do not drive or use heavy machinery while taking prescription opioid pain medicine. Lifestyle  Do not use drugs or alcohol to reduce pain. If you drink alcohol, limit how much you have to: 0-1 drink a day for women who are not pregnant. 0-2 drinks a day for men. Know how much alcohol is in a drink. In the [place] one drink equals one 12 oz bottle of beer (355 mL), one 5 oz glass of wine (148 mL), or one 1 oz glass of hard liquor (44 mL). Do not use any products that contain nicotine or tobacco. These products include cigarettes, chewing tobacco, and vaping devices, such as e-cigarettes. If you need help quitting, ask your health care provider. Eat a healthy diet and maintain a healthy weight. Poor diet and excess weight may make pain worse. Eat foods that are high in fiber. These include fresh  fruits and vegetables, whole grains, and beans. Limit foods that are high in fat and processed sugars, such as fried and sweet foods. Exercise regularly. Exercise lowers stress and may help relieve pain. Ask your health care provider what activities and exercises are safe for you. If your health care provider approves, join an exercise class that combines movement and stress reduction. Examples include yoga and tai chi. Get enough sleep. Lack of sleep may make pain worse. Lower stress as much as possible. Practice stress reduction techniques as told by your therapist. General instructions Work with all your pain  management providers to find the treatments that work best for you. You are an important member of your pain management team. There are many things you can do to reduce pain on your own. Consider joining an online or in-person support group for people who have chronic pain. Keep all follow-up visits. This is important. Where to find more information You can find more information about managing pain without opioids from: American Academy of Pain Medicine: painmed.org Institute for Chronic Pain: instituteforchronicpain.org American Chronic Pain Association: theacpa.org Contact a health care provider if: You have side effects from pain medicine. Your pain gets worse or does not get better with treatments or home therapy. You are struggling with anxiety or depression. Summary Many types of pain can be managed without opioids. Chronic pain may respond better to pain management without opioids. Pain is best managed when you and a team of health care providers work together. Pain management without opioids may include non-opioid medicines, medical treatments, physical therapy, mental health therapy, and lifestyle changes. Tell your health care providers if your pain gets worse or is not being managed well enough. This information is not intended to replace advice given to you by your health care provider. Make sure you discuss any questions you have with your health care provider. Document Revised: 10/28/2020 Document Reviewed: 10/28/2020 Elsevier Patient Education  2024 [employer].

## 2024-02-14 ENCOUNTER — Other Ambulatory Visit: Payer: Self-pay | Admitting: Internal Medicine

## 2024-02-14 ENCOUNTER — Encounter: Payer: Self-pay | Admitting: Internal Medicine

## 2024-02-15 ENCOUNTER — Other Ambulatory Visit: Payer: Self-pay

## 2024-02-15 MED ORDER — ONDANSETRON HCL 4 MG PO TABS: ORAL_TABLET | ORAL | 1 refills | Status: DC

## 2024-02-15 NOTE — Telephone Encounter (Signed)
 Copied from CRM [id number]. Topic: Clinical - Medication Question >> Feb 15, 2024  7:59 AM Terri Roberts wrote: Reason for CRM: Pt wanted to check on the status of her ondansetron  (ZOFRAN ) 4 MG tablet refill request.   [id number] [name], [place] - [address] AT [facility] [address] [place]  [address] Phone: [phone] Fax: [phone] Hours: Not open 24 hours

## 2024-02-21 ENCOUNTER — Other Ambulatory Visit: Payer: Self-pay | Admitting: Internal Medicine

## 2024-03-20 ENCOUNTER — Other Ambulatory Visit: Payer: Self-pay | Admitting: Internal Medicine

## 2024-03-26 DIAGNOSIS — Z01810 Encounter for preprocedural cardiovascular examination: Secondary | ICD-10-CM | POA: Diagnosis not present

## 2024-03-26 DIAGNOSIS — R000 Tachycardia, unspecified: Secondary | ICD-10-CM | POA: Diagnosis not present

## 2024-04-15 ENCOUNTER — Encounter: Payer: Self-pay | Admitting: Internal Medicine

## 2024-04-15 ENCOUNTER — Other Ambulatory Visit: Payer: Self-pay | Admitting: Internal Medicine

## 2024-04-16 DIAGNOSIS — E785 Hyperlipidemia, unspecified: Secondary | ICD-10-CM | POA: Insufficient documentation

## 2024-04-16 MED ORDER — ONDANSETRON HCL 4 MG PO TABS: ORAL_TABLET | ORAL | 0 refills | Status: DC

## 2024-04-16 NOTE — Progress Notes (Unsigned)
 Virtual Visit via Video Note  I connected with Terri Roberts on 04/16/24 at  8:30 AM EDT by a video enabled telemedicine application and verified that I am speaking with the correct person using two identifiers.   I discussed the limitations of evaluation and management by telemedicine and the availability of in person appointments. The patient expressed understanding and agreed to proceed.  Present for the visit:  Myself, Dr Glade Hope, Terri Roberts.  The patient is currently in her car driving and I am in the office.    No referring provider.    History of Present Illness: She is here for follow up of his chronic medical conditions - GERD, N/V, HLD.  She continues to follow with rheumatology, endocrine, nephrology and is seeing the transplant team at [facility].  She continues to have daily nausea and takes the Zofran  typically twice a day.  If she takes it she can often avoid vomiting.  She has frequent reflux.  She is taking Tums and is on omeprazole  40 mg daily.  She denies any difficulty swallowing.  She denies abdominal pain.  She is on the wait list for new kidney.      Social History   Socioeconomic History   Marital status: Widowed    Spouse name: Not on file   Number of children: 2   Years of education: 14   Highest education level: Some college, no degree  Occupational History   Occupation: Market researcher Compliance Specialist   Occupation: disablied  Tobacco Use   Smoking status: Never   Smokeless tobacco: Never  Vaping Use   Vaping status: Never Used  Substance and Sexual Activity   Alcohol use: Not Currently    Comment: rarely   Drug use: Yes    Frequency: 2.0 times per week    Types: Marijuana    Comment: weekly   Sexual activity: Not on file  Other Topics Concern   Not on file  Social History Narrative   Lives at home with her daughter and granddaughter/2025 and 1 dog   Right-handed.   One cup caffeine per day.   Social Drivers of Health    Financial Resource Strain: High Risk (04/16/2024)   Overall Financial Resource Strain (CARDIA)    Difficulty of Paying Living Expenses: Hard  Food Insecurity: Food Insecurity Present (04/16/2024)   Hunger Vital Sign    Worried About Running Out of Food in the Last Year: Sometimes true    Ran Out of Food in the Last Year: Sometimes true  Transportation Needs: No Transportation Needs (04/16/2024)   PRAPARE - Administrator, Civil Service (Medical): No    Lack of Transportation (Non-Medical): No  Physical Activity: Insufficiently Active (04/16/2024)   Exercise Vital Sign    Days of Exercise per Week: 7 days    Minutes of Exercise per Session: 10 min  Stress: Stress Concern Present (04/16/2024)   [employer] of Occupational Health - Occupational Stress Questionnaire    Feeling of Stress: To some extent  Social Connections: Moderately Integrated (04/16/2024)   Social Connection and Isolation Panel    Frequency of Communication with Friends and Family: More than three times a week    Frequency of Social Gatherings with Friends and Family: Once a week    Attends Religious Services: More than 4 times per year    Active Member of [employer] or Organizations: Yes    Attends Banker Meetings: More than 4 times per year  Marital Status: Widowed  Recent Concern: Social Connections - Moderately Isolated (02/13/2024)   Social Connection and Isolation Panel    Frequency of Communication with Friends and Family: More than three times a week    Frequency of Social Gatherings with Friends and Family: Once a week    Attends Religious Services: More than 4 times per year    Active Member of [employer] or Organizations: No    Attends Banker Meetings: Never    Marital Status: Widowed     Observations/Objective: Appears well in NAD His breathing normally and speaking in full sentences   Assessment and Plan:  See Problem List for Assessment and Plan of chronic  medical problems.   Follow Up Instructions:    I discussed the assessment and treatment plan with the patient. The patient was provided an opportunity to ask questions and all were answered. The patient agreed with the plan and demonstrated an understanding of the instructions.   The patient was advised to call back or seek an in-person evaluation if the symptoms worsen or if the condition fails to improve as anticipated.    Glade JINNY Hope, MD

## 2024-04-17 ENCOUNTER — Telehealth (INDEPENDENT_AMBULATORY_CARE_PROVIDER_SITE_OTHER): Admitting: Internal Medicine

## 2024-04-17 DIAGNOSIS — E7849 Other hyperlipidemia: Secondary | ICD-10-CM

## 2024-04-17 DIAGNOSIS — K219 Gastro-esophageal reflux disease without esophagitis: Secondary | ICD-10-CM

## 2024-04-17 DIAGNOSIS — R112 Nausea with vomiting, unspecified: Secondary | ICD-10-CM

## 2024-04-17 MED ORDER — OMEPRAZOLE 40 MG PO CPDR: 40.0000 mg | DELAYED_RELEASE_CAPSULE | Freq: Two times a day (BID) | ORAL | 3 refills | Status: AC

## 2024-04-17 MED ORDER — ONDANSETRON HCL 4 MG PO TABS: 4.0000 mg | ORAL_TABLET | Freq: Two times a day (BID) | ORAL | 3 refills | Status: AC | PRN

## 2024-04-17 MED ORDER — ROSUVASTATIN CALCIUM 5 MG PO TABS: 5.0000 mg | ORAL_TABLET | Freq: Every day | ORAL | 1 refills | Status: AC

## 2024-04-17 NOTE — Assessment & Plan Note (Signed)
 Chronic Last LDL 09/2023 was 68 Continue Crestor  5 mg daily

## 2024-04-17 NOTE — Patient Instructions (Addendum)
 SABRA

## 2024-04-17 NOTE — Assessment & Plan Note (Signed)
 Chronic Likely related to uncontrolled GERD, which she is having daily Omeprazole  increased to twice daily Continue Zofran  which is helping to prevent vomiting-4 mg twice daily as needed Discussed possible GI referral-she deferred at this time, but may need to see GI if symptoms are not improving

## 2024-04-17 NOTE — Assessment & Plan Note (Signed)
 Chronic GERD is not controlled-experiencing daily GERD, nausea which is likely related to the reflux and vomiting Increase omeprazole  to 40 mg twice daily Continue Zofran  4 mg twice daily as needed-hopefully better controlling her reflux will prevent some of the nausea and vomiting We discussed GI referral-she deferred at this time

## 2024-05-23 DIAGNOSIS — Z981 Arthrodesis status: Secondary | ICD-10-CM | POA: Diagnosis not present

## 2024-05-23 DIAGNOSIS — N184 Chronic kidney disease, stage 4 (severe): Secondary | ICD-10-CM | POA: Diagnosis not present

## 2024-05-23 DIAGNOSIS — G894 Chronic pain syndrome: Secondary | ICD-10-CM | POA: Diagnosis not present

## 2024-05-23 DIAGNOSIS — M545 Low back pain, unspecified: Secondary | ICD-10-CM | POA: Diagnosis not present

## 2024-07-16 ENCOUNTER — Encounter: Payer: Self-pay | Admitting: Pharmacist

## 2024-07-16 NOTE — Progress Notes (Signed)
 Pharmacy Quality Measure Review  This patient is appearing on a report for being at risk of failing the adherence measure for cholesterol (statin) medications this calendar year.   Medication: Rosuvastatin  Last fill date: 07/15/24 for 90 day supply  Insurance report was not up to date. No action needed at this time.   Darrelyn Drum, PharmD, BCPS, CPP Clinical Pharmacist Practitioner [facility] [phone]

## 2025-02-13 ENCOUNTER — Ambulatory Visit
